# Patient Record
Sex: Male | Born: 1945 | Race: White | Hispanic: No | Marital: Married | Smoking: Former smoker
Health system: Southern US, Community
[De-identification: ages and names within clinical notes are randomized; demographics above are authoritative.]

## PROBLEM LIST (undated history)

## (undated) DIAGNOSIS — N4340 Spermatocele of epididymis, unspecified: Secondary | ICD-10-CM

## (undated) DIAGNOSIS — K5900 Constipation, unspecified: Secondary | ICD-10-CM

## (undated) DIAGNOSIS — C801 Malignant (primary) neoplasm, unspecified: Secondary | ICD-10-CM

## (undated) DIAGNOSIS — Z8639 Personal history of other endocrine, nutritional and metabolic disease: Secondary | ICD-10-CM

## (undated) DIAGNOSIS — E118 Type 2 diabetes mellitus with unspecified complications: Secondary | ICD-10-CM

## (undated) DIAGNOSIS — R351 Nocturia: Secondary | ICD-10-CM

## (undated) DIAGNOSIS — Z87448 Personal history of other diseases of urinary system: Secondary | ICD-10-CM

## (undated) DIAGNOSIS — Z8042 Family history of malignant neoplasm of prostate: Secondary | ICD-10-CM

## (undated) DIAGNOSIS — N185 Chronic kidney disease, stage 5: Secondary | ICD-10-CM

## (undated) DIAGNOSIS — E119 Type 2 diabetes mellitus without complications: Secondary | ICD-10-CM

## (undated) DIAGNOSIS — Z803 Family history of malignant neoplasm of breast: Secondary | ICD-10-CM

## (undated) DIAGNOSIS — I10 Essential (primary) hypertension: Secondary | ICD-10-CM

## (undated) DIAGNOSIS — N2889 Other specified disorders of kidney and ureter: Secondary | ICD-10-CM

## (undated) DIAGNOSIS — Z800 Family history of malignant neoplasm of digestive organs: Secondary | ICD-10-CM

## (undated) DIAGNOSIS — R972 Elevated prostate specific antigen [PSA]: Secondary | ICD-10-CM

## (undated) HISTORY — DX: Family history of malignant neoplasm of breast: Z80.3

## (undated) HISTORY — DX: Family history of malignant neoplasm of digestive organs: Z80.0

## (undated) HISTORY — DX: Family history of malignant neoplasm of prostate: Z80.42

---

## 1969-03-15 HISTORY — PX: OTHER SURGICAL HISTORY: SHX169

## 2005-07-15 HISTORY — PX: OTHER SURGICAL HISTORY: SHX169

## 2010-03-27 ENCOUNTER — Ambulatory Visit: Payer: Self-pay | Admitting: Oncology

## 2010-04-19 LAB — CBC WITH DIFFERENTIAL/PLATELET
Basophils Absolute: 0 10*3/uL (ref 0.0–0.1)
Eosinophils Absolute: 0.2 10*3/uL (ref 0.0–0.5)
HCT: 30.4 % — ABNORMAL LOW (ref 38.4–49.9)
HGB: 10.2 g/dL — ABNORMAL LOW (ref 13.0–17.1)
LYMPH%: 24.9 % (ref 14.0–49.0)
MONO#: 0.5 10*3/uL (ref 0.1–0.9)
NEUT#: 2.7 10*3/uL (ref 1.5–6.5)
NEUT%: 58.2 % (ref 39.0–75.0)
Platelets: 238 10*3/uL (ref 140–400)
WBC: 4.7 10*3/uL (ref 4.0–10.3)

## 2010-04-20 LAB — PROTEIN / CREATININE RATIO, URINE
Protein Creatinine Ratio: 0.29 — ABNORMAL HIGH (ref <0.15)
Total Protein, Urine: 53 mg/dL

## 2010-04-23 LAB — IRON AND TIBC: Iron: 67 ug/dL (ref 42–165)

## 2010-04-23 LAB — BETA 2 MICROGLOBULIN, SERUM: Beta-2 Microglobulin: 5.9 mg/L — ABNORMAL HIGH (ref 1.01–1.73)

## 2010-04-23 LAB — COMPREHENSIVE METABOLIC PANEL
ALT: 9 U/L (ref 0–53)
CO2: 23 mEq/L (ref 19–32)
Calcium: 9.5 mg/dL (ref 8.4–10.5)
Chloride: 103 mEq/L (ref 96–112)
Sodium: 139 mEq/L (ref 135–145)
Total Protein: 6.6 g/dL (ref 6.0–8.3)

## 2010-04-23 LAB — URIC ACID: Uric Acid, Serum: 7.2 mg/dL (ref 4.0–7.8)

## 2010-04-23 LAB — IMMUNOFIXATION ELECTROPHORESIS: IgM, Serum: 39 mg/dL — ABNORMAL LOW (ref 60–263)

## 2010-04-23 LAB — FERRITIN: Ferritin: 22 ng/mL (ref 22–322)

## 2010-04-23 LAB — KAPPA/LAMBDA LIGHT CHAINS
Kappa free light chain: 2.25 mg/dL — ABNORMAL HIGH (ref 0.33–1.94)
Lambda Free Lght Chn: 1.81 mg/dL (ref 0.57–2.63)

## 2010-04-23 LAB — LACTATE DEHYDROGENASE: LDH: 150 U/L (ref 94–250)

## 2010-04-23 LAB — CREATININE CLEARANCE, URINE, 24 HOUR
Collection Interval-CRCL: 24 hours
Creatinine Clearance: 31 mL/min — ABNORMAL LOW (ref 75–125)
Creatinine, 24H Ur: 1440 mg/d (ref 800–2000)
Creatinine, Urine: 92.9 mg/dL

## 2010-04-23 LAB — TRANSFERRIN RECEPTOR, SOLUABLE: Transferrin Receptor, Soluble: 17.1 nmol/L

## 2010-04-25 LAB — UIFE/LIGHT CHAINS/TP QN, 24-HR UR
Beta, Urine: DETECTED — AB
Free Kappa Lt Chains,Ur: 12.4 mg/dL — ABNORMAL HIGH (ref 0.04–1.51)
Free Lambda Lt Chains,Ur: 1.34 mg/dL — ABNORMAL HIGH (ref 0.08–1.01)
Free Lt Chn Excr Rate: 192.2 mg/d
Total Protein, Urine-Ur/day: 372 mg/d — ABNORMAL HIGH (ref 10–140)
Volume, Urine: 1550 mL

## 2010-06-20 ENCOUNTER — Ambulatory Visit: Payer: Self-pay | Admitting: Oncology

## 2010-06-22 LAB — CBC WITH DIFFERENTIAL/PLATELET
BASO%: 0.8 % (ref 0.0–2.0)
Eosinophils Absolute: 0.2 10*3/uL (ref 0.0–0.5)
MCHC: 32.8 g/dL (ref 32.0–36.0)
MONO#: 0.5 10*3/uL (ref 0.1–0.9)
NEUT#: 3.2 10*3/uL (ref 1.5–6.5)
RBC: 4.36 10*6/uL (ref 4.20–5.82)
RDW: 15.4 % — ABNORMAL HIGH (ref 11.0–14.6)
WBC: 5.1 10*3/uL (ref 4.0–10.3)
lymph#: 1.1 10*3/uL (ref 0.9–3.3)

## 2010-06-22 LAB — COMPREHENSIVE METABOLIC PANEL
ALT: 12 U/L (ref 0–53)
AST: 19 U/L (ref 0–37)
CO2: 27 mEq/L (ref 19–32)
Calcium: 9.7 mg/dL (ref 8.4–10.5)
Chloride: 103 mEq/L (ref 96–112)
Creatinine, Ser: 3.17 mg/dL — ABNORMAL HIGH (ref 0.40–1.50)
Sodium: 139 mEq/L (ref 135–145)
Total Protein: 7.4 g/dL (ref 6.0–8.3)

## 2010-06-22 LAB — LACTATE DEHYDROGENASE: LDH: 134 U/L (ref 94–250)

## 2010-06-25 LAB — KAPPA/LAMBDA LIGHT CHAINS
Kappa free light chain: 3.74 mg/dL — ABNORMAL HIGH (ref 0.33–1.94)
Lambda Free Lght Chn: 2.77 mg/dL — ABNORMAL HIGH (ref 0.57–2.63)

## 2010-06-25 LAB — IGG, IGA, IGM: IgG (Immunoglobin G), Serum: 1030 mg/dL (ref 694–1618)

## 2010-07-17 ENCOUNTER — Ambulatory Visit: Payer: Self-pay | Admitting: Oncology

## 2012-07-31 ENCOUNTER — Other Ambulatory Visit (HOSPITAL_COMMUNITY): Payer: Self-pay | Admitting: Urology

## 2012-07-31 DIAGNOSIS — D49519 Neoplasm of unspecified behavior of unspecified kidney: Secondary | ICD-10-CM

## 2012-08-01 ENCOUNTER — Emergency Department (HOSPITAL_COMMUNITY): Payer: Medicare Other

## 2012-08-01 ENCOUNTER — Inpatient Hospital Stay (HOSPITAL_COMMUNITY)
Admission: EM | Admit: 2012-08-01 | Discharge: 2012-08-04 | DRG: 872 | Disposition: A | Discharge status: Home / Self Care | Payer: Medicare Other | Attending: Internal Medicine | Admitting: Internal Medicine

## 2012-08-01 ENCOUNTER — Encounter (HOSPITAL_COMMUNITY): Payer: Self-pay | Admitting: Emergency Medicine

## 2012-08-01 DIAGNOSIS — E119 Type 2 diabetes mellitus without complications: Secondary | ICD-10-CM

## 2012-08-01 DIAGNOSIS — E1129 Type 2 diabetes mellitus with other diabetic kidney complication: Secondary | ICD-10-CM | POA: Diagnosis present

## 2012-08-01 DIAGNOSIS — N184 Chronic kidney disease, stage 4 (severe): Secondary | ICD-10-CM | POA: Diagnosis present

## 2012-08-01 DIAGNOSIS — Z79899 Other long term (current) drug therapy: Secondary | ICD-10-CM

## 2012-08-01 DIAGNOSIS — N453 Epididymo-orchitis: Secondary | ICD-10-CM

## 2012-08-01 DIAGNOSIS — I129 Hypertensive chronic kidney disease with stage 1 through stage 4 chronic kidney disease, or unspecified chronic kidney disease: Secondary | ICD-10-CM | POA: Diagnosis present

## 2012-08-01 DIAGNOSIS — A419 Sepsis, unspecified organism: Principal | ICD-10-CM | POA: Diagnosis present

## 2012-08-01 DIAGNOSIS — D72829 Elevated white blood cell count, unspecified: Secondary | ICD-10-CM

## 2012-08-01 DIAGNOSIS — E1169 Type 2 diabetes mellitus with other specified complication: Secondary | ICD-10-CM | POA: Diagnosis present

## 2012-08-01 DIAGNOSIS — N189 Chronic kidney disease, unspecified: Secondary | ICD-10-CM

## 2012-08-01 DIAGNOSIS — E162 Hypoglycemia, unspecified: Secondary | ICD-10-CM | POA: Diagnosis present

## 2012-08-01 DIAGNOSIS — E871 Hypo-osmolality and hyponatremia: Secondary | ICD-10-CM | POA: Diagnosis present

## 2012-08-01 DIAGNOSIS — Q619 Cystic kidney disease, unspecified: Secondary | ICD-10-CM

## 2012-08-01 DIAGNOSIS — I10 Essential (primary) hypertension: Secondary | ICD-10-CM

## 2012-08-01 DIAGNOSIS — N451 Epididymitis: Secondary | ICD-10-CM

## 2012-08-01 DIAGNOSIS — N179 Acute kidney failure, unspecified: Secondary | ICD-10-CM

## 2012-08-01 HISTORY — DX: Essential (primary) hypertension: I10

## 2012-08-01 LAB — BASIC METABOLIC PANEL
CO2: 23 mEq/L (ref 19–32)
Chloride: 88 mEq/L — ABNORMAL LOW (ref 96–112)
Creatinine, Ser: 4.61 mg/dL — ABNORMAL HIGH (ref 0.50–1.35)

## 2012-08-01 LAB — URINALYSIS, ROUTINE W REFLEX MICROSCOPIC
Glucose, UA: NEGATIVE mg/dL
Ketones, ur: NEGATIVE mg/dL
Leukocytes, UA: NEGATIVE
pH: 5.5 (ref 5.0–8.0)

## 2012-08-01 LAB — CBC WITH DIFFERENTIAL/PLATELET
Basophils Absolute: 0 10*3/uL (ref 0.0–0.1)
HCT: 25.7 % — ABNORMAL LOW (ref 39.0–52.0)
Hemoglobin: 8.8 g/dL — ABNORMAL LOW (ref 13.0–17.0)
Lymphocytes Relative: 2 % — ABNORMAL LOW (ref 12–46)
Monocytes Absolute: 1.2 10*3/uL — ABNORMAL HIGH (ref 0.1–1.0)
Monocytes Relative: 5 % (ref 3–12)
Neutro Abs: 22.2 10*3/uL — ABNORMAL HIGH (ref 1.7–7.7)
RBC: 3.84 MIL/uL — ABNORMAL LOW (ref 4.22–5.81)
RDW: 18 % — ABNORMAL HIGH (ref 11.5–15.5)
WBC: 23.8 10*3/uL — ABNORMAL HIGH (ref 4.0–10.5)

## 2012-08-01 LAB — URINE MICROSCOPIC-ADD ON

## 2012-08-01 LAB — GLUCOSE, CAPILLARY
Glucose-Capillary: 96 mg/dL (ref 70–99)
Glucose-Capillary: 148 mg/dL — ABNORMAL HIGH (ref 70–99)
Glucose-Capillary: 25 mg/dL — CL (ref 70–99)
Glucose-Capillary: 113 mg/dL — ABNORMAL HIGH (ref 70–99)
Glucose-Capillary: 29 mg/dL — CL (ref 70–99)

## 2012-08-01 LAB — GRAM STAIN

## 2012-08-01 MED ORDER — DEXTROSE 50 % IV SOLN
INTRAVENOUS | Status: AC
  Administered 2012-08-01: 50 mL
  Filled 2012-08-01: qty 50

## 2012-08-01 MED ORDER — DEXTROSE 50 % IV SOLN
1.0000 | Freq: Once | INTRAVENOUS | Status: DC
  Filled 2012-08-01: qty 50

## 2012-08-01 MED ORDER — DEXTROSE 50 % IV SOLN
1.0000 | Freq: Once | INTRAVENOUS | Status: AC
  Administered 2012-08-01: 50 mL via INTRAVENOUS
  Filled 2012-08-01: qty 50

## 2012-08-01 MED ORDER — DEXTROSE 50 % IV SOLN
1.0000 | Freq: Once | INTRAVENOUS | Status: AC
  Administered 2012-08-01: 50 mL via INTRAVENOUS

## 2012-08-01 MED ORDER — DEXTROSE 10 % IV SOLN
INTRAVENOUS | Status: DC
  Administered 2012-08-01 – 2012-08-02 (×2): via INTRAVENOUS

## 2012-08-01 MED ORDER — SODIUM CHLORIDE 0.9 % IV BOLUS (SEPSIS)
1000.0000 mL | Freq: Once | INTRAVENOUS | Status: AC
  Administered 2012-08-01: 1000 mL via INTRAVENOUS

## 2012-08-01 NOTE — ED Notes (Signed)
Pt presents to ED via EMS from home with c/o of unresponsive. CBG 36 EMS gave of d10. 20g in left hand.

## 2012-08-01 NOTE — ED Notes (Signed)
cbg was71.

## 2012-08-01 NOTE — ED Notes (Signed)
cbg was 117 

## 2012-08-01 NOTE — ED Notes (Signed)
Patient transported to Ultrasound 

## 2012-08-01 NOTE — ED Notes (Signed)
cbg was 113 

## 2012-08-01 NOTE — ED Provider Notes (Signed)
History     CSN: [id number]  Arrival date & time 08/01/12  1735   First MD Initiated Contact with Patient 08/01/12 1736      Chief Complaint  Patient presents with  . Hypoglycemia    (Consider location/radiation/quality/duration/timing/severity/associated sxs/prior treatment) Patient is a 67 y.o. male presenting with diabetes problem. The history is provided by the patient.  Diabetes He presents for his follow-up diabetic visit. He has type 2 diabetes mellitus. His disease course has been fluctuating. Hypoglycemia symptoms include confusion and dizziness. Pertinent negatives for hypoglycemia include no headaches. Pertinent negatives for diabetes include no chest pain, no fatigue and no weakness. Hypoglycemia complications include blackouts. Risk factors for coronary artery disease include hypertension. Current diabetic treatment includes oral agent (dual therapy). He is compliant with treatment all of the time. His weight is stable. He is following a diabetic diet. His home blood glucose trend is fluctuating dramatically.    Past Medical History  Diagnosis Date  . Diabetes mellitus without complication   . Hypertension     History reviewed. No pertinent past surgical history.  History reviewed. No pertinent family history.  History  Substance Use Topics  . Smoking status: Not on file  . Smokeless tobacco: Not on file  . Alcohol Use:       Review of Systems  Constitutional: Negative for fever and fatigue.  HENT: Negative for congestion, rhinorrhea and postnasal drip.   Eyes: Negative for photophobia and visual disturbance.  Respiratory: Negative for chest tightness, shortness of breath and wheezing.   Cardiovascular: Negative for chest pain, palpitations and leg swelling.  Gastrointestinal: Negative for nausea, vomiting, abdominal pain and diarrhea.  Genitourinary: Negative for urgency, frequency and difficulty urinating.  Musculoskeletal: Negative for back pain and  arthralgias.  Skin: Negative for rash and wound.  Neurological: Positive for dizziness. Negative for weakness and headaches.  Psychiatric/Behavioral: Positive for confusion. Negative for agitation.    Allergies  Review of patient's allergies indicates no known allergies.  Home Medications   Current Outpatient Rx  Name  Route  Sig  Dispense  Refill  . AMLODIPINE BESYLATE 10 MG PO TABS   Oral   Take 10 mg by mouth daily.         Marland Kitchen CIPROFLOXACIN HCL 250 MG PO TABS   Oral   Take 250 mg by mouth daily.         Marland Kitchen CLONIDINE HCL 0.2 MG PO TABS   Oral   Take 0.2 mg by mouth 2 (two) times daily.         Marland Kitchen DILTIAZEM HCL ER BEADS 240 MG PO CP24   Oral   Take 240 mg by mouth daily.         Marland Kitchen GLIPIZIDE 10 MG PO TABS   Oral   Take 10 mg by mouth 2 (two) times daily before a meal.         . HYDRALAZINE HCL 25 MG PO TABS   Oral   Take 25 mg by mouth 4 (four) times daily.         Marland Kitchen LABETALOL HCL 200 MG PO TABS   Oral   Take 200 mg by mouth 2 (two) times daily.         Marland Kitchen LOSARTAN POTASSIUM-HCTZ 100-25 MG PO TABS   Oral   Take 1 tablet by mouth daily.         Marland Kitchen METFORMIN HCL 500 MG PO TABS   Oral   Take 500 mg by mouth  3 (three) times daily.         Marland Kitchen POTASSIUM CHLORIDE ER 10 MEQ PO TBCR   Oral   Take 10 mEq by mouth 2 (two) times daily.           BP 146/72  Pulse 77  Temp 100.1 F (37.8 C) (Oral)  Resp 24  SpO2 98%  Physical Exam  Nursing note and vitals reviewed. Constitutional: He is oriented to person, place, and time. He appears well-developed and well-nourished. No distress.  HENT:  Head: Normocephalic and atraumatic.  Mouth/Throat: Oropharynx is clear and moist.  Eyes: EOM are normal. Pupils are equal, round, and reactive to light.  Neck: Normal range of motion. Neck supple.  Cardiovascular: Normal rate, regular rhythm, normal heart sounds and intact distal pulses.   Pulmonary/Chest: Effort normal and breath sounds normal. He has no  wheezes. He has no rales.  Abdominal: Soft. Bowel sounds are normal. He exhibits no distension. There is no tenderness. There is no rebound and no guarding.  Genitourinary:       Right testicular tenderness. Normal cremasteric reflex. Right testicle is swollen, red, and tender.  Musculoskeletal: Normal range of motion. He exhibits no edema and no tenderness.  Lymphadenopathy:    He has no cervical adenopathy.  Neurological: He is alert and oriented to person, place, and time. He displays normal reflexes. No cranial nerve deficit. He exhibits normal muscle tone. Coordination normal.  Skin: Skin is warm and dry. No rash noted.  Psychiatric: He has a normal mood and affect. His behavior is normal.    ED Course  Procedures (including critical care time)  Labs Reviewed  CBC WITH DIFFERENTIAL - Abnormal; Notable for the following:    WBC 23.8 (*)     RBC 3.84 (*)     Hemoglobin 8.8 (*)     HCT 25.7 (*)     MCV 66.9 (*)     MCH 22.9 (*)     RDW 18.0 (*)     Neutrophils Relative 93 (*)     Neutro Abs 22.2 (*)     Lymphocytes Relative 2 (*)     Lymphs Abs 0.4 (*)     Monocytes Absolute 1.2 (*)     All other components within normal limits  BASIC METABOLIC PANEL - Abnormal; Notable for the following:    Sodium 129 (*)     Chloride 88 (*)     BUN 58 (*)     Creatinine, Ser 4.61 (*)     GFR calc non Af Amer 12 (*)     GFR calc Af Amer 14 (*)     All other components within normal limits  URINALYSIS, ROUTINE W REFLEX MICROSCOPIC - Abnormal; Notable for the following:    APPearance CLOUDY (*)     Protein, ur 30 (*)     All other components within normal limits  GLUCOSE, CAPILLARY - Abnormal; Notable for the following:    Glucose-Capillary 25 (*)     All other components within normal limits  GLUCOSE, CAPILLARY - Abnormal; Notable for the following:    Glucose-Capillary 148 (*)     All other components within normal limits  GLUCOSE, CAPILLARY - Abnormal; Notable for the following:      Glucose-Capillary 31 (*)     All other components within normal limits  GLUCOSE, CAPILLARY - Abnormal; Notable for the following:    Glucose-Capillary 117 (*)     All other components within normal limits  GLUCOSE,  CAPILLARY - Abnormal; Notable for the following:    Glucose-Capillary 53 (*)     All other components within normal limits  GRAM STAIN  GLUCOSE, CAPILLARY  URINE MICROSCOPIC-ADD ON  URINE CULTURE  LACTIC ACID, PLASMA   US Scrotum  08/01/2012  *RADIOLOGY REPORT*  Clinical Data:  67 year old male with right scrotal swelling.  SCROTAL ULTRASOUND DOPPLER ULTRASOUND OF THE TESTICLES  Technique: Complete ultrasound examination of the testicles, epididymis, and other scrotal structures was performed.  Color and spectral Doppler ultrasound were also utilized to evaluate blood flow to the testicles.  Comparison:  None  Findings:  Right testis:  The right testicle is normal in size and echogenicity measuring 4.8 x 2.9 x 3.6 cm.  No focal testicular masses are identified.  Increased vascularity of the right testicle is noted.  Color Doppler flow and arterial/venous waveforms are noted.  Left testis:  The left testicle is normal in size and echogenicity measuring 4 x 2.7 x 2.6 cm.  There is no evidence of solid mass. Normal color Doppler flow and arterial/venous waveforms are noted.  Right epididymis:  The right epididymis is not enlarged and hypervascular compatible with epididymitis.  Left epididymis:  Small spermatoceles/epididymal cysts are noted.  Hydrocele:  A large right hydrocele is noted. A moderate left hydrocele with internal debris/echoes is noted.  Varicocele:  A left varicocele is present.  Pulsed Doppler interrogation of both testes demonstrates low resistance flow bilaterally.  IMPRESSION: Enlarged right epididymis with increased vascularity in both the right epididymis and right testicle - compatible with right epididymo-orchitis.  Associated large right hydrocele.  Moderate left  hydrocele with low level echoes/debris.  No evidence of testicular torsion or focal testicular mass.  Left varicocele.   Original Report Authenticated By: Harmon Pier, M.D.    [place] Art/ven Flow Abd Pelv Doppler  08/01/2012  *RADIOLOGY REPORT*  Clinical Data:  67 year old male with right scrotal swelling.  SCROTAL ULTRASOUND DOPPLER ULTRASOUND OF THE TESTICLES  Technique: Complete ultrasound examination of the testicles, epididymis, and other scrotal structures was performed.  Color and spectral Doppler ultrasound were also utilized to evaluate blood flow to the testicles.  Comparison:  None  Findings:  Right testis:  The right testicle is normal in size and echogenicity measuring 4.8 x 2.9 x 3.6 cm.  No focal testicular masses are identified.  Increased vascularity of the right testicle is noted.  Color Doppler flow and arterial/venous waveforms are noted.  Left testis:  The left testicle is normal in size and echogenicity measuring 4 x 2.7 x 2.6 cm.  There is no evidence of solid mass. Normal color Doppler flow and arterial/venous waveforms are noted.  Right epididymis:  The right epididymis is not enlarged and hypervascular compatible with epididymitis.  Left epididymis:  Small spermatoceles/epididymal cysts are noted.  Hydrocele:  A large right hydrocele is noted. A moderate left hydrocele with internal debris/echoes is noted.  Varicocele:  A left varicocele is present.  Pulsed Doppler interrogation of both testes demonstrates low resistance flow bilaterally.  IMPRESSION: Enlarged right epididymis with increased vascularity in both the right epididymis and right testicle - compatible with right epididymo-orchitis.  Associated large right hydrocele.  Moderate left hydrocele with low level echoes/debris.  No evidence of testicular torsion or focal testicular mass.  Left varicocele.   Original Report Authenticated By: Harmon Pier, M.D.      1. Hypoglycemia   2. Epididymo-orchitis without abscess   3. Acute  renal failure   4. Leukocytosis  5. Diabetes mellitus without complication   6. Hypertension   7. CKD (chronic kidney disease)   8. Epididymitis       MDM  63M presents after a hypoglycemic event x 2 today. Pt is a known diabetic currently on Metformin and glipizide. This morning, pt was unresponsive per wife, EMS came and found the patient to be hypoglycemic and was given dextrose. Pt improved and did not come to the hospital. Again, this afternoon, pt became unresponsive and was given 250 cc of D10 and is almost back to baseline. Still with some confusion. Also, pt is currently on Ciprofloxacin for a epididymitis that was started  3 days ago by his urologist. Current blood sugar is 96. Will obtain labs and plan for observation in hospital for recurrent hypoglycemia.  Labs reveal significant leukocytosis of 23,800. Worsening renal failure with creatinine 4.6. Urine studies pending. Right testicle swollen and tender. Will obtain scrotal u/s to r/o testicular abscess.  Glucose dropped again to 25. Given 1 amp of D50 and improved to 148. Placed on D10 gtt. Pending ultrasound.   Ultrasound c/w epididymo-orchitis without abscess. Admitted to medicine for persistent hypoglycemia and acute renal failure.        Johnnette Gourd, MD 08/01/12 [phone]

## 2012-08-01 NOTE — H&P (Signed)
Chief Complaint:  Low sugar  HPI: 67 yo male dx with epididymitis about 3 days ago with swollen and painful rt testicle place on cipro by urology comes in with several days of not eating normally and found to have glucose in the 20s today.  His glucose has been dipping low for several days but he is still taking his oral hyperglycemic meds.  No fevers but has been having chills.  His testicle is less swollen than 3 days ago.  Is suppose to take cipro for 28 days or so then f/u with urology for a biopsy.  Has been drinking fluid but not eating normally due to his recent illness.  No n/v/d.  In ED his glucose is dropping in the 50's despite D50 given.  Took his dm meds this am but not tonight's doses.  Review of Systems:  O/w neg  Past Medical History: Past Medical History  Diagnosis Date  . Diabetes mellitus without complication   . Hypertension    History reviewed. No pertinent past surgical history.  Medications: Prior to Admission medications   Medication Sig Start Date End Date Taking? Authorizing Provider  amLODipine (NORVASC) 10 MG tablet Take 10 mg by mouth daily.   Yes Historical Provider, MD  ciprofloxacin (CIPRO) 250 MG tablet Take 250 mg by mouth daily.   Yes Historical Provider, MD  cloNIDine (CATAPRES) 0.2 MG tablet Take 0.2 mg by mouth 2 (two) times daily.   Yes Historical Provider, MD  diltiazem (TIAZAC) 240 MG 24 hr capsule Take 240 mg by mouth daily.   Yes Historical Provider, MD  glipiZIDE (GLUCOTROL) 10 MG tablet Take 10 mg by mouth 2 (two) times daily before a meal.   Yes Historical Provider, MD  hydrALAZINE (APRESOLINE) 25 MG tablet Take 25 mg by mouth 4 (four) times daily.   Yes Historical Provider, MD  labetalol (NORMODYNE) 200 MG tablet Take 200 mg by mouth 2 (two) times daily.   Yes Historical Provider, MD  losartan-hydrochlorothiazide (HYZAAR) 100-25 MG per tablet Take 1 tablet by mouth daily.   Yes Historical Provider, MD  metFORMIN (GLUCOPHAGE) 500 MG tablet  Take 500 mg by mouth 3 (three) times daily.   Yes Historical Provider, MD  potassium chloride (K-DUR) 10 MEQ tablet Take 10 mEq by mouth 2 (two) times daily.   Yes Historical Provider, MD    Allergies:  No Known Allergies  Social History: neg  Family History: History reviewed. No pertinent family history.  Physical Exam: Filed Vitals:   08/01/12 1800 08/01/12 1915 08/01/12 1930 08/01/12 1945  BP: 122/97 133/70 140/69 146/72  Pulse: 84 68 79 77  Temp:      TempSrc:      Resp:      SpO2: 100% 96% 97% 98%   General appearance: alert, cooperative and no distress Neck: no JVD and supple, symmetrical, trachea midline Lungs: clear to auscultation bilaterally Heart: regular rate and rhythm, S1, S2 normal, no murmur, click, rub or gallop Abdomen: soft, non-tender; bowel sounds normal; no masses,  no organomegaly Male genitalia: normal penis, abnormal findings: swollen and painful rt testicle Extremities: extremities normal, atraumatic, no cyanosis or edema Pulses: 2+ and symmetric Skin: Skin color, texture, turgor normal. No rashes or lesions Neurologic: Grossly normal    Labs on Admission:   [facility] 08/01/12 1743  NA 129*  K 4.1  CL 88*  CO2 23  GLUCOSE 71  BUN 58*  CREATININE 4.61*  CALCIUM 9.3  MG --  PHOS --  Basename 08/01/12 1743  WBC 23.8*  NEUTROABS 22.2*  HGB 8.8*  HCT 25.7*  MCV 66.9*  PLT 203    Radiological Exams on Admission: US Scrotum  08/01/2012  *RADIOLOGY REPORT*  Clinical Data:  67 year old male with right scrotal swelling.  SCROTAL ULTRASOUND DOPPLER ULTRASOUND OF THE TESTICLES  Technique: Complete ultrasound examination of the testicles, epididymis, and other scrotal structures was performed.  Color and spectral Doppler ultrasound were also utilized to evaluate blood flow to the testicles.  Comparison:  None  Findings:  Right testis:  The right testicle is normal in size and echogenicity measuring 4.8 x 2.9 x 3.6 cm.  No focal testicular  masses are identified.  Increased vascularity of the right testicle is noted.  Color Doppler flow and arterial/venous waveforms are noted.  Left testis:  The left testicle is normal in size and echogenicity measuring 4 x 2.7 x 2.6 cm.  There is no evidence of solid mass. Normal color Doppler flow and arterial/venous waveforms are noted.  Right epididymis:  The right epididymis is not enlarged and hypervascular compatible with epididymitis.  Left epididymis:  Small spermatoceles/epididymal cysts are noted.  Hydrocele:  A large right hydrocele is noted. A moderate left hydrocele with internal debris/echoes is noted.  Varicocele:  A left varicocele is present.  Pulsed Doppler interrogation of both testes demonstrates low resistance flow bilaterally.  IMPRESSION: Enlarged right epididymis with increased vascularity in both the right epididymis and right testicle - compatible with right epididymo-orchitis.  Associated large right hydrocele.  Moderate left hydrocele with low level echoes/debris.  No evidence of testicular torsion or focal testicular mass.  Left varicocele.   Original Report Authenticated By: Harmon Pier, M.D.    [place] Art/ven Flow Abd Pelv Doppler  08/01/2012  *RADIOLOGY REPORT*  Clinical Data:  67 year old male with right scrotal swelling.  SCROTAL ULTRASOUND DOPPLER ULTRASOUND OF THE TESTICLES  Technique: Complete ultrasound examination of the testicles, epididymis, and other scrotal structures was performed.  Color and spectral Doppler ultrasound were also utilized to evaluate blood flow to the testicles.  Comparison:  None  Findings:  Right testis:  The right testicle is normal in size and echogenicity measuring 4.8 x 2.9 x 3.6 cm.  No focal testicular masses are identified.  Increased vascularity of the right testicle is noted.  Color Doppler flow and arterial/venous waveforms are noted.  Left testis:  The left testicle is normal in size and echogenicity measuring 4 x 2.7 x 2.6 cm.  There is no  evidence of solid mass. Normal color Doppler flow and arterial/venous waveforms are noted.  Right epididymis:  The right epididymis is not enlarged and hypervascular compatible with epididymitis.  Left epididymis:  Small spermatoceles/epididymal cysts are noted.  Hydrocele:  A large right hydrocele is noted. A moderate left hydrocele with internal debris/echoes is noted.  Varicocele:  A left varicocele is present.  Pulsed Doppler interrogation of both testes demonstrates low resistance flow bilaterally.  IMPRESSION: Enlarged right epididymis with increased vascularity in both the right epididymis and right testicle - compatible with right epididymo-orchitis.  Associated large right hydrocele.  Moderate left hydrocele with low level echoes/debris.  No evidence of testicular torsion or focal testicular mass.  Left varicocele.   Original Report Authenticated By: Harmon Pier, M.D.     Assessment/Plan  67 yo male with recent epididymitis, poor oral intake with hypoglycemia due to oral hyperglycemic medications Principal Problem:  *Hypoglycemia Active Problems:  Diabetes mellitus without complication  Hypertension  Acute renal failure  CKD (chronic kidney disease)  Epididymitis  Hold all dm meds.  Cont d10.  Ck glucose hourly.  Cont cipro.  hypoglyemia should resolve overnight with holding meds.  Obs.  Cont f/u with urology as outpt.  Full code.    Levonte Molina A 08/01/2012, 9:59 PM

## 2012-08-01 NOTE — ED Notes (Signed)
cbg was 31.

## 2012-08-01 NOTE — ED Notes (Signed)
cbg was 53.

## 2012-08-01 NOTE — ED Notes (Signed)
CBG 96. 

## 2012-08-02 ENCOUNTER — Encounter (HOSPITAL_COMMUNITY): Payer: Self-pay | Admitting: *Deleted

## 2012-08-02 DIAGNOSIS — E871 Hypo-osmolality and hyponatremia: Secondary | ICD-10-CM | POA: Diagnosis present

## 2012-08-02 DIAGNOSIS — N452 Orchitis: Secondary | ICD-10-CM

## 2012-08-02 DIAGNOSIS — A419 Sepsis, unspecified organism: Secondary | ICD-10-CM | POA: Diagnosis present

## 2012-08-02 DIAGNOSIS — N453 Epididymo-orchitis: Secondary | ICD-10-CM | POA: Diagnosis present

## 2012-08-02 DIAGNOSIS — D72829 Elevated white blood cell count, unspecified: Secondary | ICD-10-CM

## 2012-08-02 LAB — GLUCOSE, CAPILLARY
Glucose-Capillary: 114 mg/dL — ABNORMAL HIGH (ref 70–99)
Glucose-Capillary: 119 mg/dL — ABNORMAL HIGH (ref 70–99)
Glucose-Capillary: 130 mg/dL — ABNORMAL HIGH (ref 70–99)
Glucose-Capillary: 139 mg/dL — ABNORMAL HIGH (ref 70–99)
Glucose-Capillary: 142 mg/dL — ABNORMAL HIGH (ref 70–99)
Glucose-Capillary: 51 mg/dL — ABNORMAL LOW (ref 70–99)
Glucose-Capillary: 74 mg/dL (ref 70–99)
Glucose-Capillary: 80 mg/dL (ref 70–99)
Glucose-Capillary: 91 mg/dL (ref 70–99)
Glucose-Capillary: 94 mg/dL (ref 70–99)

## 2012-08-02 LAB — HEMOGLOBIN A1C
Hgb A1c MFr Bld: 6.5 % — ABNORMAL HIGH (ref <5.7)
Mean Plasma Glucose: 140 mg/dL — ABNORMAL HIGH (ref <117)

## 2012-08-02 LAB — CBC
Hemoglobin: 8.1 g/dL — ABNORMAL LOW (ref 13.0–17.0)
MCH: 22.8 pg — ABNORMAL LOW (ref 26.0–34.0)
Platelets: 204 10*3/uL (ref 150–400)
Platelets: 182 10*3/uL (ref 150–400)
RBC: 3.43 MIL/uL — ABNORMAL LOW (ref 4.22–5.81)
RBC: 3.34 MIL/uL — ABNORMAL LOW (ref 4.22–5.81)
WBC: 22.5 10*3/uL — ABNORMAL HIGH (ref 4.0–10.5)
WBC: 18.9 10*3/uL — ABNORMAL HIGH (ref 4.0–10.5)

## 2012-08-02 LAB — BASIC METABOLIC PANEL
Calcium: 8.7 mg/dL (ref 8.4–10.5)
Calcium: 8.7 mg/dL (ref 8.4–10.5)
Chloride: 89 mEq/L — ABNORMAL LOW (ref 96–112)
GFR calc Af Amer: 13 mL/min — ABNORMAL LOW (ref >90)
GFR calc Af Amer: 14 mL/min — ABNORMAL LOW (ref >90)
GFR calc Af Amer: 14 mL/min — ABNORMAL LOW (ref >90)
GFR calc non Af Amer: 12 mL/min — ABNORMAL LOW (ref >90)
GFR calc non Af Amer: 12 mL/min — ABNORMAL LOW (ref >90)
GFR calc non Af Amer: 12 mL/min — ABNORMAL LOW (ref >90)
Potassium: 3.3 mEq/L — ABNORMAL LOW (ref 3.5–5.1)
Potassium: 3.4 mEq/L — ABNORMAL LOW (ref 3.5–5.1)
Potassium: 3.5 mEq/L (ref 3.5–5.1)
Sodium: 124 mEq/L — ABNORMAL LOW (ref 135–145)
Sodium: 124 mEq/L — ABNORMAL LOW (ref 135–145)
Sodium: 125 mEq/L — ABNORMAL LOW (ref 135–145)

## 2012-08-02 LAB — PHOSPHORUS: Phosphorus: 3.8 mg/dL (ref 2.3–4.6)

## 2012-08-02 LAB — CREATININE, SERUM
Creatinine, Ser: 4.59 mg/dL — ABNORMAL HIGH (ref 0.50–1.35)
GFR calc Af Amer: 14 mL/min — ABNORMAL LOW (ref >90)
GFR calc non Af Amer: 12 mL/min — ABNORMAL LOW (ref >90)

## 2012-08-02 LAB — PROTEIN / CREATININE RATIO, URINE: Protein Creatinine Ratio: 0.21 — ABNORMAL HIGH (ref 0.00–0.15)

## 2012-08-02 MED ORDER — POTASSIUM CHLORIDE 20 MEQ PO PACK: 40.0000 meq | PACK | Freq: Once | ORAL | Status: DC

## 2012-08-02 MED ORDER — CIPROFLOXACIN HCL 250 MG PO TABS: 250.0000 mg | ORAL_TABLET | Freq: Every day | ORAL | Status: DC

## 2012-08-02 MED ORDER — POTASSIUM CHLORIDE CRYS ER 20 MEQ PO TBCR
20.0000 meq | EXTENDED_RELEASE_TABLET | Freq: Once | ORAL | Status: AC
  Administered 2012-08-02: 20 meq via ORAL
  Filled 2012-08-02 (×2): qty 1

## 2012-08-02 MED ORDER — LABETALOL HCL 200 MG PO TABS
200.0000 mg | ORAL_TABLET | Freq: Two times a day (BID) | ORAL | Status: DC
  Administered 2012-08-02 – 2012-08-04 (×5): 200 mg via ORAL
  Filled 2012-08-02 (×7): qty 1

## 2012-08-02 MED ORDER — DOXYCYCLINE HYCLATE 100 MG PO TABS
50.0000 mg | ORAL_TABLET | Freq: Two times a day (BID) | ORAL | Status: DC
  Administered 2012-08-02 – 2012-08-04 (×5): 50 mg via ORAL
  Filled 2012-08-02 (×7): qty 0.5

## 2012-08-02 MED ORDER — DILTIAZEM HCL ER BEADS 240 MG PO CP24
240.0000 mg | ORAL_CAPSULE | Freq: Every day | ORAL | Status: DC
  Filled 2012-08-02: qty 1

## 2012-08-02 MED ORDER — ACETAMINOPHEN 325 MG PO TABS: 650.0000 mg | ORAL_TABLET | Freq: Four times a day (QID) | ORAL | Status: DC | PRN

## 2012-08-02 MED ORDER — DEXTROSE 5 % IV SOLN
1.0000 g | INTRAVENOUS | Status: DC
  Administered 2012-08-02 – 2012-08-04 (×3): 1 g via INTRAVENOUS
  Filled 2012-08-02 (×3): qty 10

## 2012-08-02 MED ORDER — AMLODIPINE BESYLATE 10 MG PO TABS
10.0000 mg | ORAL_TABLET | Freq: Every day | ORAL | Status: DC
  Filled 2012-08-02: qty 1

## 2012-08-02 MED ORDER — SODIUM CHLORIDE 0.9 % IV SOLN
INTRAVENOUS | Status: DC
  Administered 2012-08-02: 16:00:00 via INTRAVENOUS

## 2012-08-02 MED ORDER — SODIUM CHLORIDE 0.9 % IV SOLN: INTRAVENOUS | Status: AC

## 2012-08-02 MED ORDER — POTASSIUM CHLORIDE 20 MEQ PO PACK
20.0000 meq | PACK | Freq: Once | ORAL | Status: DC
  Filled 2012-08-02: qty 1

## 2012-08-02 MED ORDER — SODIUM CHLORIDE 0.9 % IJ SOLN
3.0000 mL | Freq: Two times a day (BID) | INTRAMUSCULAR | Status: DC
  Administered 2012-08-03: 3 mL via INTRAVENOUS

## 2012-08-02 MED ORDER — DEXTROSE 50 % IV SOLN
INTRAVENOUS | Status: AC
  Administered 2012-08-02: 50 mL
  Filled 2012-08-02: qty 50

## 2012-08-02 MED ORDER — HEPARIN SODIUM (PORCINE) 5000 UNIT/ML IJ SOLN
5000.0000 [IU] | Freq: Three times a day (TID) | INTRAMUSCULAR | Status: DC
  Administered 2012-08-03 – 2012-08-04 (×4): 5000 [IU] via SUBCUTANEOUS
  Filled 2012-08-02 (×7): qty 1

## 2012-08-02 MED ORDER — ENOXAPARIN SODIUM 30 MG/0.3ML ~~LOC~~ SOLN
30.0000 mg | SUBCUTANEOUS | Status: DC
  Administered 2012-08-02: 30 mg via SUBCUTANEOUS
  Filled 2012-08-02 (×2): qty 0.3

## 2012-08-02 MED ORDER — CLONIDINE HCL 0.2 MG PO TABS
0.2000 mg | ORAL_TABLET | Freq: Two times a day (BID) | ORAL | Status: DC
  Filled 2012-08-02 (×3): qty 1

## 2012-08-02 MED ORDER — DEXTROSE-NACL 5-0.9 % IV SOLN
INTRAVENOUS | Status: DC
  Administered 2012-08-02: 13:00:00 via INTRAVENOUS

## 2012-08-02 MED ORDER — HYDRALAZINE HCL 25 MG PO TABS
25.0000 mg | ORAL_TABLET | Freq: Four times a day (QID) | ORAL | Status: DC
  Filled 2012-08-02 (×4): qty 1

## 2012-08-02 MED ORDER — CLONIDINE HCL 0.1 MG PO TABS
0.1000 mg | ORAL_TABLET | Freq: Two times a day (BID) | ORAL | Status: DC
  Administered 2012-08-02 – 2012-08-04 (×5): 0.1 mg via ORAL
  Filled 2012-08-02 (×6): qty 1

## 2012-08-02 MED ORDER — ACETAMINOPHEN 325 MG PO TABS
650.0000 mg | ORAL_TABLET | Freq: Four times a day (QID) | ORAL | Status: DC | PRN
  Administered 2012-08-02 – 2012-08-03 (×3): 650 mg via ORAL
  Filled 2012-08-02 (×3): qty 2

## 2012-08-02 MED ORDER — CIPROFLOXACIN IN D5W 200 MG/100ML IV SOLN
200.0000 mg | Freq: Two times a day (BID) | INTRAVENOUS | Status: DC
  Administered 2012-08-02: 200 mg via INTRAVENOUS
  Filled 2012-08-02 (×2): qty 100

## 2012-08-02 MED ORDER — DOXYCYCLINE HYCLATE 50 MG PO CAPS
50.0000 mg | ORAL_CAPSULE | Freq: Two times a day (BID) | ORAL | Status: DC
  Filled 2012-08-02 (×2): qty 1

## 2012-08-02 NOTE — Progress Notes (Signed)
DBP 50's did not receive BP meds, will re-eval.

## 2012-08-02 NOTE — Consult Note (Signed)
Reason for Consult:Rt epidiymorchitis, end-stage renal disease, elevated PSA, bilateral renal masses Referring Physician: Eldridge Dace MD  Tim Armstrong is an 67 y.o. male.  HPI:   1 - Rt Epidiymorchitis - Pt with 1 week history of worsening rt scrotal pain. No dysuria. UA negative. Was placed on empiric Cipro 3 days ago for suspected epidiymorchitis, but then developed fevers and hypoglycemia dn admitted to [facility]. Placed on IV rocephin for further therapy. Scrotal US on admissino confirms rt epididymorchitis w/o abscess and reactive hydrocele.   2 - Elevated PSA - Random PSA of 5.7 07/2012 on eval for hematuria. His father had prostate cancer. He has biopsy scheduled and pending 08/2012 at [facility].  3 - Microscopic Hematuria - Had cysto and CT 07/2012 which revealed large friable prostate as well as small bilateral renal masses as per below. No gross episodes.  4 - Bilateral Renal Masses - Bilateral small renal masses, incompletely characterized by recent CT stone study. Unable to receive contrast due to very poor renal function. Has abdominal MRI pending, previously scheduled as outpatient.  5 - End-Stage Renal Disease - pt with Cr 4's by BMP's past few weeks. Recent CT without hydro or evidence of obstructive uropathy. Has nephrology eval pending.   Past Medical History  Diagnosis Date  . Diabetes mellitus without complication   . Hypertension     History reviewed. No pertinent past surgical history.  History reviewed. No pertinent family history.  Social History:  does not have a smoking history on file. He does not have any smokeless tobacco history on file. His alcohol and drug histories not on file.  Allergies: No Known Allergies  Medications: I have reviewed the patient's current medications.  Results for orders placed during the hospital encounter of 08/01/12 (from the past 48 hour(s))  CBC WITH DIFFERENTIAL     Status: Abnormal   Collection Time   08/01/12  5:43 PM        Component Value Range Comment   WBC 23.8 (*) 4.0 - 10.5 K/uL    RBC 3.84 (*) 4.22 - 5.81 MIL/uL    Hemoglobin 8.8 (*) 13.0 - 17.0 g/dL    HCT 16.1 (*) 09.6 - 52.0 %    MCV 66.9 (*) 78.0 - 100.0 fL    MCH 22.9 (*) 26.0 - 34.0 pg    MCHC 34.2  30.0 - 36.0 g/dL    RDW 04.5 (*) 40.9 - 15.5 %    Platelets 203  150 - 400 K/uL    Neutrophils Relative 93 (*) 43 - 77 %    Neutro Abs 22.2 (*) 1.7 - 7.7 K/uL    Lymphocytes Relative 2 (*) 12 - 46 %    Lymphs Abs 0.4 (*) 0.7 - 4.0 K/uL    Monocytes Relative 5  3 - 12 %    Monocytes Absolute 1.2 (*) 0.1 - 1.0 K/uL    Eosinophils Relative 0  0 - 5 %    Eosinophils Absolute 0.0  0.0 - 0.7 K/uL    Basophils Relative 0  0 - 1 %    Basophils Absolute 0.0  0.0 - 0.1 K/uL   BASIC METABOLIC PANEL     Status: Abnormal   Collection Time   08/01/12  5:43 PM      Component Value Range Comment   Sodium 129 (*) 135 - 145 mEq/L    Potassium 4.1  3.5 - 5.1 mEq/L    Chloride 88 (*) 96 - 112 mEq/L  CO2 23  19 - 32 mEq/L    Glucose, Bld 71  70 - 99 mg/dL    BUN 58 (*) 6 - 23 mg/dL    Creatinine, Ser 1.30 (*) 0.50 - 1.35 mg/dL    Calcium 9.3  8.4 - 86.5 mg/dL    GFR calc non Af Amer 12 (*) >90 mL/min    GFR calc Af Amer 14 (*) >90 mL/min   GLUCOSE, CAPILLARY     Status: Normal   Collection Time   08/01/12  5:43 PM      Component Value Range Comment   Glucose-Capillary 96  70 - 99 mg/dL   URINALYSIS, ROUTINE W REFLEX MICROSCOPIC     Status: Abnormal   Collection Time   08/01/12  6:33 PM      Component Value Range Comment   Color, Urine YELLOW  YELLOW    APPearance CLOUDY (*) CLEAR    Specific Gravity, Urine 1.014  1.005 - 1.030    pH 5.5  5.0 - 8.0    Glucose, UA NEGATIVE  NEGATIVE mg/dL    Hgb urine dipstick NEGATIVE  NEGATIVE    Bilirubin Urine NEGATIVE  NEGATIVE    Ketones, ur NEGATIVE  NEGATIVE mg/dL    Protein, ur 30 (*) NEGATIVE mg/dL    Urobilinogen, UA 0.2  0.0 - 1.0 mg/dL    Nitrite NEGATIVE  NEGATIVE    Leukocytes, UA NEGATIVE   NEGATIVE   GRAM STAIN     Status: Normal   Collection Time   08/01/12  6:33 PM      Component Value Range Comment   Specimen Description URINE, CLEAN CATCH      Special Requests NONE      Gram Stain        Value: CYTOSPIN SLIDE     WBC PRESENT,BOTH PMN AND MONONUCLEAR     NEGATIVE FOR BACTERIA   Report Status 08/01/2012 FINAL     URINE MICROSCOPIC-ADD ON     Status: Normal   Collection Time   08/01/12  6:33 PM      Component Value Range Comment   Squamous Epithelial / LPF RARE  RARE    WBC, UA 0-2  <3 WBC/hpf   GLUCOSE, CAPILLARY     Status: Abnormal   Collection Time   08/01/12  6:59 PM      Component Value Range Comment   Glucose-Capillary 25 (*) 70 - 99 mg/dL   GLUCOSE, CAPILLARY     Status: Abnormal   Collection Time   08/01/12  7:20 PM      Component Value Range Comment   Glucose-Capillary 148 (*) 70 - 99 mg/dL   GLUCOSE, CAPILLARY     Status: Abnormal   Collection Time   08/01/12  8:44 PM      Component Value Range Comment   Glucose-Capillary 31 (*) 70 - 99 mg/dL   GLUCOSE, CAPILLARY     Status: Abnormal   Collection Time   08/01/12  9:16 PM      Component Value Range Comment   Glucose-Capillary 117 (*) 70 - 99 mg/dL   GLUCOSE, CAPILLARY     Status: Abnormal   Collection Time   08/01/12 10:03 PM      Component Value Range Comment   Glucose-Capillary 53 (*) 70 - 99 mg/dL   LACTIC ACID, PLASMA     Status: Normal   Collection Time   08/01/12 10:37 PM      Component Value Range Comment  Lactic Acid, Venous 0.7  0.5 - 2.2 mmol/L   GLUCOSE, CAPILLARY     Status: Abnormal   Collection Time   08/01/12 11:00 PM      Component Value Range Comment   Glucose-Capillary 29 (*) 70 - 99 mg/dL   GLUCOSE, CAPILLARY     Status: Abnormal   Collection Time   08/01/12 11:29 PM      Component Value Range Comment   Glucose-Capillary 113 (*) 70 - 99 mg/dL   GLUCOSE, CAPILLARY     Status: Normal   Collection Time   08/01/12 11:51 PM      Component Value Range Comment    Glucose-Capillary 71  70 - 99 mg/dL   GLUCOSE, CAPILLARY     Status: Abnormal   Collection Time   08/02/12 12:17 AM      Component Value Range Comment   Glucose-Capillary 51 (*) 70 - 99 mg/dL    Comment 1 Notify RN      Comment 2 Documented in Chart     CBC     Status: Abnormal   Collection Time   08/02/12 12:36 AM      Component Value Range Comment   WBC 22.5 (*) 4.0 - 10.5 K/uL    RBC 3.43 (*) 4.22 - 5.81 MIL/uL    Hemoglobin 8.1 (*) 13.0 - 17.0 g/dL    HCT 16.1 (*) 09.6 - 52.0 %    MCV 66.2 (*) 78.0 - 100.0 fL    MCH 23.6 (*) 26.0 - 34.0 pg    MCHC 35.7  30.0 - 36.0 g/dL    RDW 04.5 (*) 40.9 - 15.5 %    Platelets 204  150 - 400 K/uL   CREATININE, SERUM     Status: Abnormal   Collection Time   08/02/12 12:36 AM      Component Value Range Comment   Creatinine, Ser 4.59 (*) 0.50 - 1.35 mg/dL    GFR calc non Af Amer 12 (*) >90 mL/min    GFR calc Af Amer 14 (*) >90 mL/min   GLUCOSE, CAPILLARY     Status: Abnormal   Collection Time   08/02/12 12:52 AM      Component Value Range Comment   Glucose-Capillary 139 (*) 70 - 99 mg/dL    Comment 1 Notify RN      Comment 2 Documented in Chart     GLUCOSE, CAPILLARY     Status: Normal   Collection Time   08/02/12  2:08 AM      Component Value Range Comment   Glucose-Capillary 80  70 - 99 mg/dL    Comment 1 Notify RN      Comment 2 Documented in Chart     GLUCOSE, CAPILLARY     Status: Abnormal   Collection Time   08/02/12  3:04 AM      Component Value Range Comment   Glucose-Capillary 63 (*) 70 - 99 mg/dL    Comment 1 Notify RN      Comment 2 Documented in Chart     GLUCOSE, CAPILLARY     Status: Abnormal   Collection Time   08/02/12  3:59 AM      Component Value Range Comment   Glucose-Capillary 142 (*) 70 - 99 mg/dL    Comment 1 Notify RN      Comment 2 Documented in Chart     GLUCOSE, CAPILLARY     Status: Abnormal   Collection Time   08/02/12  5:01  AM      Component Value Range Comment   Glucose-Capillary 119 (*) 70 - 99  mg/dL    Comment 1 Notify RN      Comment 2 Documented in Chart     GLUCOSE, CAPILLARY     Status: Abnormal   Collection Time   08/02/12  6:06 AM      Component Value Range Comment   Glucose-Capillary 109 (*) 70 - 99 mg/dL    Comment 1 Notify RN      Comment 2 Documented in Chart     BASIC METABOLIC PANEL     Status: Abnormal   Collection Time   08/02/12  7:00 AM      Component Value Range Comment   Sodium 124 (*) 135 - 145 mEq/L    Potassium 3.3 (*) 3.5 - 5.1 mEq/L DELTA CHECK NOTED   Chloride 89 (*) 96 - 112 mEq/L    CO2 22  19 - 32 mEq/L    Glucose, Bld 83  70 - 99 mg/dL    BUN 59 (*) 6 - 23 mg/dL    Creatinine, Ser 1.19 (*) 0.50 - 1.35 mg/dL    Calcium 8.7  8.4 - 14.7 mg/dL    GFR calc non Af Amer 12 (*) >90 mL/min    GFR calc Af Amer 13 (*) >90 mL/min   CBC     Status: Abnormal   Collection Time   08/02/12  7:00 AM      Component Value Range Comment   WBC 18.9 (*) 4.0 - 10.5 K/uL    RBC 3.34 (*) 4.22 - 5.81 MIL/uL    Hemoglobin 7.6 (*) 13.0 - 17.0 g/dL    HCT 82.9 (*) 56.2 - 52.0 %    MCV 65.9 (*) 78.0 - 100.0 fL    MCH 22.8 (*) 26.0 - 34.0 pg    MCHC 34.5  30.0 - 36.0 g/dL    RDW 13.0 (*) 86.5 - 15.5 %    Platelets 182  150 - 400 K/uL   GLUCOSE, CAPILLARY     Status: Normal   Collection Time   08/02/12  7:01 AM      Component Value Range Comment   Glucose-Capillary 80  70 - 99 mg/dL    Comment 1 Notify RN      Comment 2 Documented in Chart       US Scrotum  08/01/2012  *RADIOLOGY REPORT*  Clinical Data:  67 year old male with right scrotal swelling.  SCROTAL ULTRASOUND DOPPLER ULTRASOUND OF THE TESTICLES  Technique: Complete ultrasound examination of the testicles, epididymis, and other scrotal structures was performed.  Color and spectral Doppler ultrasound were also utilized to evaluate blood flow to the testicles.  Comparison:  None  Findings:  Right testis:  The right testicle is normal in size and echogenicity measuring 4.8 x 2.9 x 3.6 cm.  No focal testicular  masses are identified.  Increased vascularity of the right testicle is noted.  Color Doppler flow and arterial/venous waveforms are noted.  Left testis:  The left testicle is normal in size and echogenicity measuring 4 x 2.7 x 2.6 cm.  There is no evidence of solid mass. Normal color Doppler flow and arterial/venous waveforms are noted.  Right epididymis:  The right epididymis is not enlarged and hypervascular compatible with epididymitis.  Left epididymis:  Small spermatoceles/epididymal cysts are noted.  Hydrocele:  A large right hydrocele is noted. A moderate left hydrocele with internal debris/echoes is noted.  Varicocele:  A left varicocele is present.  Pulsed Doppler interrogation of both testes demonstrates low resistance flow bilaterally.  IMPRESSION: Enlarged right epididymis with increased vascularity in both the right epididymis and right testicle - compatible with right epididymo-orchitis.  Associated large right hydrocele.  Moderate left hydrocele with low level echoes/debris.  No evidence of testicular torsion or focal testicular mass.  Left varicocele.   Original Report Authenticated By: Harmon Pier, M.D.    [place] Art/ven Flow Abd Pelv Doppler  08/01/2012  *RADIOLOGY REPORT*  Clinical Data:  67 year old male with right scrotal swelling.  SCROTAL ULTRASOUND DOPPLER ULTRASOUND OF THE TESTICLES  Technique: Complete ultrasound examination of the testicles, epididymis, and other scrotal structures was performed.  Color and spectral Doppler ultrasound were also utilized to evaluate blood flow to the testicles.  Comparison:  None  Findings:  Right testis:  The right testicle is normal in size and echogenicity measuring 4.8 x 2.9 x 3.6 cm.  No focal testicular masses are identified.  Increased vascularity of the right testicle is noted.  Color Doppler flow and arterial/venous waveforms are noted.  Left testis:  The left testicle is normal in size and echogenicity measuring 4 x 2.7 x 2.6 cm.  There is no  evidence of solid mass. Normal color Doppler flow and arterial/venous waveforms are noted.  Right epididymis:  The right epididymis is not enlarged and hypervascular compatible with epididymitis.  Left epididymis:  Small spermatoceles/epididymal cysts are noted.  Hydrocele:  A large right hydrocele is noted. A moderate left hydrocele with internal debris/echoes is noted.  Varicocele:  A left varicocele is present.  Pulsed Doppler interrogation of both testes demonstrates low resistance flow bilaterally.  IMPRESSION: Enlarged right epididymis with increased vascularity in both the right epididymis and right testicle - compatible with right epididymo-orchitis.  Associated large right hydrocele.  Moderate left hydrocele with low level echoes/debris.  No evidence of testicular torsion or focal testicular mass.  Left varicocele.   Original Report Authenticated By: Harmon Pier, M.D.     Review of Systems  Constitutional: Positive for fever and malaise/fatigue.  HENT: Negative.   Eyes: Negative.   Respiratory: Negative.   Cardiovascular: Negative.   Gastrointestinal: Negative.   Genitourinary: Negative.  Negative for dysuria, urgency, frequency, hematuria and flank pain.       Increasing Rt testicular pain  Musculoskeletal: Negative.   Skin: Negative.   Neurological: Negative.   Endo/Heme/Allergies: Negative.   Psychiatric/Behavioral: Negative.    Blood pressure 125/64, pulse 88, temperature 101.2 F (38.4 C), temperature source Oral, resp. rate 18, height 5\' 11"  (1.803 m), weight 67.178 kg (148 lb 1.6 oz), SpO2 100.00%. Physical Exam  Constitutional: He is oriented to person, place, and time. He appears well-developed and well-nourished.  HENT:  Head: Normocephalic and atraumatic.  Eyes: EOM are normal. Pupils are equal, round, and reactive to light.  Neck: Normal range of motion. Neck supple.  Cardiovascular: Normal rate and regular rhythm.   Respiratory: Effort normal and breath sounds normal.    GI: Soft. Bowel sounds are normal. He exhibits no distension and no mass. There is no tenderness. There is no rebound and no guarding.  Genitourinary: Rectum normal, prostate normal and penis normal.       Rt testicle firm and tender. Non-fluctuent. NO drainage. Reactive hydrocele palpable. Left side unremarkable.  Musculoskeletal: Normal range of motion.  Neurological: He is alert and oriented to person, place, and time.  Skin: Skin is warm and dry.  Psychiatric: He has a normal  mood and affect. His behavior is normal. Judgment and thought content normal.    Assessment/Plan:  1 - Rt Epidiymorchitis - No surgical indications at this point. Agree with IV rocephin as empiric therapy given significant renal impairment.   2 - Elevated PSA - Proceed with biopsy as scheduled 08/2012.  3 - Microscopic Hematuria - s/p eval 07/2012.  4 - Bilateral Renal Masses - Will RX abdominal MRI while in house (no contrast) to help characterize to expedite work-up.  5 - End-Stage Renal Disease - No evidence of obstructive uropathy or acute electrolyte abnormality, suspect medical renal disease given long h/o DM, HTN. Nephrology eval pending, but perhaps can be expedited now that pt in house.   Will follow / call with questions.   Rylie Knierim 08/02/2012, 11:35 AM

## 2012-08-02 NOTE — Progress Notes (Signed)
[facility] PROGRESS NOTE  ELENO WEIMAR WJX:[id number] DOB: 1945/07/24 DOA: 08/01/2012 PCP: No primary provider on file.  Assessment/Plan: 1. Sepsis - 2/2 Epididymo-orchitis, with fever, leukocytosis, and the right testicular infectious source. Patient is not tachycardic however he is on calcium channel blockers. Empirically started ceftriaxone and doxycycline. Patient is not hypotensive at this point. We have discontinued all his antihypertensive medications, however I am continuing lower dose clonidine.  2. Hypoglycemia - it's possible that this is related his infection. I do not suspect exogenous insulin use. He does not have a prescription for insulin, and le doesn't live with anyone diabetic and on insulin.Marland Kitchen He is on fluids that contain dextrose, and we'll continue to monitor.  2. Hyponatremia - I discontinued the D10 since this may contribute to the sodium going from 129-124 overnight, and switched to D5 NS since his hyponatremia appears hypovolemic and he needs some hydration in the setting of having fever. Will consult renal and appreciate their input.  3. acute renal failure, possibly on chronic kidney disease - I suspect that this is somewhat chronic. Given the high PSA and the fact that urology plans for biopsy, is not unreasonable to think that the has a postobstructive etiology. Alternatively, he is on a pretty impressive antihypertensive regimen, which might also point towards hypertensive renal disease. We will also check a hemoglobin A1c to evaluate how well controlled is his diabetes and whether he may have some degree of underlying diabetic nephropathy.   4. Right Epididymo-orchitis  - we have consulted urology and appreciated continuous input. We'll discontinue the ciprofloxacin and start the patient on IV ceftriaxone and doxycycline. He has no surgical indication at this point. We'll continue to monitor.  Code Status: Full Family Communication: none (indicate person spoken  with, relationship, and if by phone, the number) Disposition Plan: Unknown.  Consultants:  Renal  Urology  Procedures:  none  Antibiotics:  Ceftriaxone 1/19 >>  Doxycyline 1/19 >>  HPI/Subjective: No complaints this morning, appreciates his infection to improve.   Objective: Filed Vitals:   08/02/12 0214 08/02/12 0326 08/02/12 0500 08/02/12 1127  BP:   127/57 125/64  Pulse:   79 88  Temp: 102 F (38.9 C) 100.4 F (38 C) 99.8 F (37.7 C) 101.2 F (38.4 C)  TempSrc:    Oral  Resp:   18   Height:      Weight:      SpO2:   99% 100%    Intake/Output Summary (Last 24 hours) at 08/02/12 1148 Last data filed at 08/02/12 1126  Gross per 24 hour  Intake   1579 ml  Output    500 ml  Net   1079 ml   Filed Weights   08/02/12 0006  Weight: 67.178 kg (148 lb 1.6 oz)    Exam:   General:   he is in no apparent distress   Cardiovascular:  heart is regular without murmurs  Respiratory:  lungs are clear to auscultation   Abdomen: abdomen soft and nontender to palpation    GU: Enlarged right testicle. Mild tenderness to palpation. Left side is normal.  Data Reviewed: Basic Metabolic Panel:  Lab 08/02/12 2130 08/02/12 0036 08/01/12 1743  NA 124* -- 129*  K 3.3* -- 4.1  CL 89* -- 88*  CO2 22 -- 23  GLUCOSE 83 -- 71  BUN 59* -- 58*  CREATININE 4.72* 4.59* 4.61*  CALCIUM 8.7 -- 9.3  MG -- -- --  PHOS -- -- --   CBC:  Lab 08/02/12 0700 08/02/12 0036 08/01/12 1743  WBC 18.9* 22.5* 23.8*  NEUTROABS -- -- 22.2*  HGB 7.6* 8.1* 8.8*  HCT 22.0* 22.7* 25.7*  MCV 65.9* 66.2* 66.9*  PLT 182 204 203   CBG:  Lab 08/02/12 0701 08/02/12 0606 08/02/12 0501 08/02/12 0359 08/02/12 0304  GLUCAP 80 109* 119* 142* 63*    Recent Results (from the past 240 hour(s))  GRAM STAIN     Status: Normal   Collection Time   08/01/12  6:33 PM      Component Value Range Status Comment   Specimen Description URINE, CLEAN CATCH   Final    Special Requests NONE   Final    Gram  Stain     Final    Value: CYTOSPIN SLIDE     WBC PRESENT,BOTH PMN AND MONONUCLEAR     NEGATIVE FOR BACTERIA   Report Status 08/01/2012 FINAL   Final      Studies: US Scrotum  08/01/2012  *RADIOLOGY REPORT*  Clinical Data:  67 year old male with right scrotal swelling.  SCROTAL ULTRASOUND DOPPLER ULTRASOUND OF THE TESTICLES  Technique: Complete ultrasound examination of the testicles, epididymis, and other scrotal structures was performed.  Color and spectral Doppler ultrasound were also utilized to evaluate blood flow to the testicles.  Comparison:  None  Findings:  Right testis:  The right testicle is normal in size and echogenicity measuring 4.8 x 2.9 x 3.6 cm.  No focal testicular masses are identified.  Increased vascularity of the right testicle is noted.  Color Doppler flow and arterial/venous waveforms are noted.  Left testis:  The left testicle is normal in size and echogenicity measuring 4 x 2.7 x 2.6 cm.  There is no evidence of solid mass. Normal color Doppler flow and arterial/venous waveforms are noted.  Right epididymis:  The right epididymis is not enlarged and hypervascular compatible with epididymitis.  Left epididymis:  Small spermatoceles/epididymal cysts are noted.  Hydrocele:  A large right hydrocele is noted. A moderate left hydrocele with internal debris/echoes is noted.  Varicocele:  A left varicocele is present.  Pulsed Doppler interrogation of both testes demonstrates low resistance flow bilaterally.  IMPRESSION: Enlarged right epididymis with increased vascularity in both the right epididymis and right testicle - compatible with right epididymo-orchitis.  Associated large right hydrocele.  Moderate left hydrocele with low level echoes/debris.  No evidence of testicular torsion or focal testicular mass.  Left varicocele.   Original Report Authenticated By: Harmon Pier, M.D.    [place] Art/ven Flow Abd Pelv Doppler  08/01/2012  *RADIOLOGY REPORT*  Clinical Data:  67 year old male with  right scrotal swelling.  SCROTAL ULTRASOUND DOPPLER ULTRASOUND OF THE TESTICLES  Technique: Complete ultrasound examination of the testicles, epididymis, and other scrotal structures was performed.  Color and spectral Doppler ultrasound were also utilized to evaluate blood flow to the testicles.  Comparison:  None  Findings:  Right testis:  The right testicle is normal in size and echogenicity measuring 4.8 x 2.9 x 3.6 cm.  No focal testicular masses are identified.  Increased vascularity of the right testicle is noted.  Color Doppler flow and arterial/venous waveforms are noted.  Left testis:  The left testicle is normal in size and echogenicity measuring 4 x 2.7 x 2.6 cm.  There is no evidence of solid mass. Normal color Doppler flow and arterial/venous waveforms are noted.  Right epididymis:  The right epididymis is not enlarged and hypervascular compatible with epididymitis.  Left epididymis:  Small spermatoceles/epididymal  cysts are noted.  Hydrocele:  A large right hydrocele is noted. A moderate left hydrocele with internal debris/echoes is noted.  Varicocele:  A left varicocele is present.  Pulsed Doppler interrogation of both testes demonstrates low resistance flow bilaterally.  IMPRESSION: Enlarged right epididymis with increased vascularity in both the right epididymis and right testicle - compatible with right epididymo-orchitis.  Associated large right hydrocele.  Moderate left hydrocele with low level echoes/debris.  No evidence of testicular torsion or focal testicular mass.  Left varicocele.   Original Report Authenticated By: Harmon Pier, M.D.     Scheduled Meds:   . cefTRIAXone (ROCEPHIN)  IV  1 g Intravenous Q24H  . cloNIDine  0.1 mg Oral BID  . doxycycline  50 mg Oral Q12H  . enoxaparin (LOVENOX) injection  30 mg Subcutaneous Q24H  . labetalol  200 mg Oral BID  . potassium chloride  20 mEq Oral Once  . sodium chloride  3 mL Intravenous Q12H   Continuous Infusions:   . dextrose 100  mL/hr at 08/02/12 2956    Principal Problem:  *Hypoglycemia Active Problems:  Diabetes mellitus without complication  Hypertension  Acute renal failure  CKD (chronic kidney disease)  Epididymitis   Pamella Pert  [facility] Pager [phone]. If 7PM-7AM, please contact night-coverage at [email or web address], password [facility] 08/02/2012, 11:48 AM  LOS: 1 day

## 2012-08-02 NOTE — Progress Notes (Signed)
CBG=99 and last hourly CBG was 94.  Will change cks to every 2 hrs.

## 2012-08-02 NOTE — Progress Notes (Signed)
Continued communication with MD, ords carried out
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
## 2012-08-02 NOTE — Progress Notes (Signed)
CRITICAL VALUE ALERT  Critical value received:  51  Date of notification: 07/1912  Time of notification          0015  Critical value read back:yes  Nurse who received alert: Zion Lint  MD notified (1st page):  [email or web address]  Time of first page:  0015  MD notified (2nd page):  Time of second page:  Responding MD: Onalee Hua  Time MD responded: [phone]

## 2012-08-02 NOTE — Progress Notes (Signed)
CRITICAL VALUE ALERT  Critical value received:  12  Date of notification: 08/02/2012  Time of notification:0330  Critical value read back:yes  Nurse who received alert:  Leandro Berkowitz  MD notified (1st page):  [email or web address]  Time of first page:  0330  MD notified (2nd page):  Time of second page:  Responding MD:  harduk  Time MD responded:  0040

## 2012-08-03 ENCOUNTER — Inpatient Hospital Stay (HOSPITAL_COMMUNITY): Payer: Medicare Other

## 2012-08-03 ENCOUNTER — Encounter (HOSPITAL_COMMUNITY): Payer: Self-pay | Admitting: *Deleted

## 2012-08-03 DIAGNOSIS — N19 Unspecified kidney failure: Secondary | ICD-10-CM

## 2012-08-03 DIAGNOSIS — N186 End stage renal disease: Secondary | ICD-10-CM

## 2012-08-03 LAB — IRON AND TIBC
Iron: <10 ug/dL — ABNORMAL LOW (ref 42–135)
UIBC: 279 ug/dL (ref 125–400)

## 2012-08-03 LAB — BASIC METABOLIC PANEL
BUN: 66 mg/dL — ABNORMAL HIGH (ref 6–23)
BUN: 72 mg/dL — ABNORMAL HIGH (ref 6–23)
CO2: 24 mEq/L (ref 19–32)
CO2: 23 mEq/L (ref 19–32)
Calcium: 9.1 mg/dL (ref 8.4–10.5)
Chloride: 87 mEq/L — ABNORMAL LOW (ref 96–112)
Chloride: 92 mEq/L — ABNORMAL LOW (ref 96–112)
Creatinine, Ser: 4.77 mg/dL — ABNORMAL HIGH (ref 0.50–1.35)
Creatinine, Ser: 4.61 mg/dL — ABNORMAL HIGH (ref 0.50–1.35)
GFR calc Af Amer: 14 mL/min — ABNORMAL LOW (ref >90)
GFR calc Af Amer: 13 mL/min — ABNORMAL LOW (ref >90)
GFR calc Af Amer: 14 mL/min — ABNORMAL LOW (ref >90)
GFR calc Af Amer: 13 mL/min — ABNORMAL LOW (ref >90)
GFR calc non Af Amer: 11 mL/min — ABNORMAL LOW (ref >90)
GFR calc non Af Amer: 11 mL/min — ABNORMAL LOW (ref >90)
Glucose, Bld: 215 mg/dL — ABNORMAL HIGH (ref 70–99)
Potassium: 3.7 mEq/L (ref 3.5–5.1)
Potassium: 3.9 mEq/L (ref 3.5–5.1)
Sodium: 124 mEq/L — ABNORMAL LOW (ref 135–145)
Sodium: 130 mEq/L — ABNORMAL LOW (ref 135–145)
Sodium: 130 mEq/L — ABNORMAL LOW (ref 135–145)

## 2012-08-03 LAB — PTH, INTACT AND CALCIUM
Calcium, Total (PTH): 8 mg/dL — ABNORMAL LOW (ref 8.4–10.5)
PTH: 176.6 pg/mL — ABNORMAL HIGH (ref 14.0–72.0)

## 2012-08-03 LAB — GLUCOSE, CAPILLARY
Glucose-Capillary: 115 mg/dL — ABNORMAL HIGH (ref 70–99)
Glucose-Capillary: 242 mg/dL — ABNORMAL HIGH (ref 70–99)
Glucose-Capillary: 273 mg/dL — ABNORMAL HIGH (ref 70–99)
Glucose-Capillary: 277 mg/dL — ABNORMAL HIGH (ref 70–99)
Glucose-Capillary: 205 mg/dL — ABNORMAL HIGH (ref 70–99)
Glucose-Capillary: 145 mg/dL — ABNORMAL HIGH (ref 70–99)

## 2012-08-03 LAB — OSMOLALITY: Osmolality: 291 mOsm/kg (ref 275–300)

## 2012-08-03 LAB — FERRITIN: Ferritin: 165 ng/mL (ref 22–322)

## 2012-08-03 LAB — URINE CULTURE: Colony Count: NO GROWTH

## 2012-08-03 MED ORDER — FUROSEMIDE 40 MG PO TABS
40.0000 mg | ORAL_TABLET | Freq: Two times a day (BID) | ORAL | Status: DC
  Administered 2012-08-03 – 2012-08-04 (×2): 40 mg via ORAL
  Filled 2012-08-03 (×4): qty 1

## 2012-08-03 MED ORDER — FERUMOXYTOL INJECTION 510 MG/17 ML
510.0000 mg | Freq: Once | INTRAVENOUS | Status: AC
  Administered 2012-08-03: 510 mg via INTRAVENOUS
  Filled 2012-08-03 (×2): qty 17

## 2012-08-03 MED ORDER — INSULIN ASPART 100 UNIT/ML ~~LOC~~ SOLN: 0.0000 [IU] | Freq: Every day | SUBCUTANEOUS | Status: DC

## 2012-08-03 MED ORDER — INSULIN ASPART 100 UNIT/ML ~~LOC~~ SOLN
0.0000 [IU] | Freq: Three times a day (TID) | SUBCUTANEOUS | Status: DC
  Administered 2012-08-03: 5 [IU] via SUBCUTANEOUS
  Administered 2012-08-04: 3 [IU] via SUBCUTANEOUS
  Administered 2012-08-04: 2 [IU] via SUBCUTANEOUS

## 2012-08-03 MED ORDER — ZOLPIDEM TARTRATE 5 MG PO TABS
5.0000 mg | ORAL_TABLET | Freq: Once | ORAL | Status: AC
  Administered 2012-08-03: 5 mg via ORAL
  Filled 2012-08-03: qty 1

## 2012-08-03 NOTE — Progress Notes (Signed)
[facility] PROGRESS NOTE  ABDIMALIK MAYORQUIN ZOX:[id number] DOB: January 03, 1946 DOA: 08/01/2012 PCP: Pcp Not In System  Assessment/Plan: 1. Sepsis - 2/2 Epididymo-orchitis, with fever, leukocytosis, and the right testicular infectious source. Patient is not tachycardic however he is on calcium channel blockers. Empirically started ceftriaxone and doxycycline. Patient is not hypotensive at this point. We have discontinued all his antihypertensive medications, however I am continuing lower dose clonidine. - will stop the ceftriaxone on d/c and continue oral doxycycline for 10 more days. if his sodium remains stable, he may be discharged tomorrow.   2. Hypoglycemia - it's possible that this is related his infection. I do not suspect exogenous insulin use. He does not have a prescription for insulin, and le doesn't live with anyone diabetic and on insulin..  - His hypoglycemia resolved, he is in fact now hyperglycemic. We have started sliding scale insulin today.  2. Hyponatremia - he has stabilized between 128 and 130. He is now on 1500 mL fluid restriction per day  3. acute renal failure, possibly on chronic kidney disease - nephrology is following, we appreciate their input. vascular surgery was consulted for vein mapping in the light of the patient needing dialysis soon.  4. Right Epididymo-orchitis  - greatly improved today - will stop the ceftriaxone on d/c and continue oral doxycycline for 10 more days.  Code Status: Full Family Communication: none (indicate person spoken with, relationship, and if by phone, the number) Disposition Plan: Unknown.  Consultants:  Renal  Urology  Procedures:  none  Antibiotics:  Ceftriaxone 1/19 >>  Doxycyline 1/19 >>  HPI/Subjective: No complaints this morning, appreciates his infection to improve.   Objective: Filed Vitals:   08/02/12 1544 08/02/12 1830 08/02/12 2101 08/03/12 1348  BP: 126/63 134/65 131/59 124/63  Pulse: 69 75 88 71    Temp: 99.2 F (37.3 C) 99.7 F (37.6 C) 100.2 F (37.9 C) 98.3 F (36.8 C)  TempSrc: Oral Oral Oral Oral  Resp:  18 18 18   Height:      Weight:      SpO2: 100% 100% 100% 100%    Intake/Output Summary (Last 24 hours) at 08/03/12 1649 Last data filed at 08/03/12 1348  Gross per 24 hour  Intake    360 ml  Output   2520 ml  Net  -2160 ml   Filed Weights   08/02/12 0006  Weight: 67.178 kg (148 lb 1.6 oz)    Exam:   General:   he is in no apparent distress   Cardiovascular:  heart is regular without murmurs  Respiratory:  lungs are clear to auscultation   Abdomen: abdomen soft and nontender to palpation    GU:  minimally Enlarged right testicle, much improved since yesterday. Mild tenderness to palpation. Left side is normal.  Data Reviewed: Basic Metabolic Panel:  Lab 08/03/12 8119 08/03/12 0617 08/03/12 0014 08/02/12 1747 08/02/12 1358  NA 128* 130* 124* 125* 124*  K 3.7 3.8 3.5 3.5 3.4*  CL 92* 93* 87* 89* 87*  CO2 23 23 24 23 23   GLUCOSE 215* 135* 115* 60* 105*  BUN 66* 65* 62* 58* 59*  CREATININE 4.61* 4.77* 4.65* 4.68* 4.64*  CALCIUM 8.8 9.1 8.7 8.0*9.0 8.7  MG -- -- -- -- --  PHOS -- -- -- 3.8 --   CBC:  Lab 08/02/12 0700 08/02/12 0036 08/01/12 1743  WBC 18.9* 22.5* 23.8*  NEUTROABS -- -- 22.2*  HGB 7.6* 8.1* 8.8*  HCT 22.0* 22.7* 25.7*  MCV 65.9*  66.2* 66.9*  PLT 182 204 203   CBG:  Lab 2012/08/09 1639 2012-08-09 1209 09-Aug-2012 1002 08-09-2012 0758 2012-08-09 0441  GLUCAP 273* 200* 242* 119* 129*    Recent Results (from the past 240 hour(s))  URINE CULTURE     Status: Normal   Collection Time   08/01/12  6:33 PM      Component Value Range Status Comment   Specimen Description URINE, CLEAN CATCH   Final    Special Requests NONE   Final    Culture  Setup Time 08/02/2012 02:55   Final    Colony Count NO GROWTH   Final    Culture NO GROWTH   Final    Report Status 08-09-12 FINAL   Final   GRAM STAIN     Status: Normal   Collection Time    08/01/12  6:33 PM      Component Value Range Status Comment   Specimen Description URINE, CLEAN CATCH   Final    Special Requests NONE   Final    Gram Stain     Final    Value: CYTOSPIN SLIDE     WBC PRESENT,BOTH PMN AND MONONUCLEAR     NEGATIVE FOR BACTERIA   Report Status 08/01/2012 FINAL   Final   CULTURE, BLOOD (ROUTINE X 2)     Status: Normal (Preliminary result)   Collection Time   08/02/12  8:35 AM      Component Value Range Status Comment   Specimen Description BLOOD ARM RIGHT   Final    Special Requests BOTTLES DRAWN AEROBIC AND ANAEROBIC 10CC EACH   Final    Culture  Setup Time 08/02/2012 18:56   Final    Culture     Final    Value:        BLOOD CULTURE RECEIVED NO GROWTH TO DATE CULTURE WILL BE HELD FOR 5 DAYS BEFORE ISSUING A FINAL NEGATIVE REPORT   Report Status PENDING   Incomplete   CULTURE, BLOOD (ROUTINE X 2)     Status: Normal (Preliminary result)   Collection Time   08/02/12  8:40 AM      Component Value Range Status Comment   Specimen Description BLOOD HAND RIGHT   Final    Special Requests BOTTLES DRAWN AEROBIC AND ANAEROBIC 10CC EACH   Final    Culture  Setup Time 08/02/2012 18:57   Final    Culture     Final    Value:        BLOOD CULTURE RECEIVED NO GROWTH TO DATE CULTURE WILL BE HELD FOR 5 DAYS BEFORE ISSUING A FINAL NEGATIVE REPORT   Report Status PENDING   Incomplete      Studies: Mr Abdomen Wo Contrast  08/09/12  *RADIOLOGY REPORT*  Clinical Data: Bilateral renal lesions.  MRI ABDOMEN WITHOUT CONTRAST  Technique:  Multiplanar multisequence MR imaging of the abdomen was performed. No intravenous contrast was administered.  Comparison: CT scan 07/21/2012.  Findings: There are innumerable bilateral renal cysts.  All these measure less than 2 cm in size.  Several of these demonstrate high T1 signal intensity consistent with hemorrhagic cyst.  A lower pole cyst on the right anteriorly appears to have at least 102 septations but other than map I do not see any  worrisome renal lesions that would suggest neoplasm.  No hydronephrosis.  Normal renal cortical thickness.  The liver is grossly normal.  No biliary dilatation.  The gallbladder is normal.  No common bile duct dilatation.  The pancreatic duct is normal.  The pancreas is normal.  The spleen is normal in size.  The adrenal glands are normal.  The aorta is normal in caliber.  No mesenteric or retroperitoneal mass or adenopathy.  The bony structures are grossly normal.  IMPRESSION:  Innumerable small bilateral renal cysts some which are complicated by hemorrhage and septations.  No worrisome MR imaging features to suggest renal neoplasm.  Exam is limited without IV contrast but I think 1-year follow-up MR examination would be adequate to assess stability.   Original Report Authenticated By: Rudie Meyer, M.D.    US Scrotum  08/01/2012  *RADIOLOGY REPORT*  Clinical Data:  67 year old male with right scrotal swelling.  SCROTAL ULTRASOUND DOPPLER ULTRASOUND OF THE TESTICLES  Technique: Complete ultrasound examination of the testicles, epididymis, and other scrotal structures was performed.  Color and spectral Doppler ultrasound were also utilized to evaluate blood flow to the testicles.  Comparison:  None  Findings:  Right testis:  The right testicle is normal in size and echogenicity measuring 4.8 x 2.9 x 3.6 cm.  No focal testicular masses are identified.  Increased vascularity of the right testicle is noted.  Color Doppler flow and arterial/venous waveforms are noted.  Left testis:  The left testicle is normal in size and echogenicity measuring 4 x 2.7 x 2.6 cm.  There is no evidence of solid mass. Normal color Doppler flow and arterial/venous waveforms are noted.  Right epididymis:  The right epididymis is not enlarged and hypervascular compatible with epididymitis.  Left epididymis:  Small spermatoceles/epididymal cysts are noted.  Hydrocele:  A large right hydrocele is noted. A moderate left hydrocele with  internal debris/echoes is noted.  Varicocele:  A left varicocele is present.  Pulsed Doppler interrogation of both testes demonstrates low resistance flow bilaterally.  IMPRESSION: Enlarged right epididymis with increased vascularity in both the right epididymis and right testicle - compatible with right epididymo-orchitis.  Associated large right hydrocele.  Moderate left hydrocele with low level echoes/debris.  No evidence of testicular torsion or focal testicular mass.  Left varicocele.   Original Report Authenticated By: Harmon Pier, M.D.    [place] Art/ven Flow Abd Pelv Doppler  08/01/2012  *RADIOLOGY REPORT*  Clinical Data:  67 year old male with right scrotal swelling.  SCROTAL ULTRASOUND DOPPLER ULTRASOUND OF THE TESTICLES  Technique: Complete ultrasound examination of the testicles, epididymis, and other scrotal structures was performed.  Color and spectral Doppler ultrasound were also utilized to evaluate blood flow to the testicles.  Comparison:  None  Findings:  Right testis:  The right testicle is normal in size and echogenicity measuring 4.8 x 2.9 x 3.6 cm.  No focal testicular masses are identified.  Increased vascularity of the right testicle is noted.  Color Doppler flow and arterial/venous waveforms are noted.  Left testis:  The left testicle is normal in size and echogenicity measuring 4 x 2.7 x 2.6 cm.  There is no evidence of solid mass. Normal color Doppler flow and arterial/venous waveforms are noted.  Right epididymis:  The right epididymis is not enlarged and hypervascular compatible with epididymitis.  Left epididymis:  Small spermatoceles/epididymal cysts are noted.  Hydrocele:  A large right hydrocele is noted. A moderate left hydrocele with internal debris/echoes is noted.  Varicocele:  A left varicocele is present.  Pulsed Doppler interrogation of both testes demonstrates low resistance flow bilaterally.  IMPRESSION: Enlarged right epididymis with increased vascularity in both the right  epididymis and right testicle - compatible with right  epididymo-orchitis.  Associated large right hydrocele.  Moderate left hydrocele with low level echoes/debris.  No evidence of testicular torsion or focal testicular mass.  Left varicocele.   Original Report Authenticated By: Harmon Pier, M.D.     Scheduled Meds:    . cefTRIAXone (ROCEPHIN)  IV  1 g Intravenous Q24H  . cloNIDine  0.1 mg Oral BID  . doxycycline  50 mg Oral Q12H  . ferumoxytol  510 mg Intravenous Once  . furosemide  40 mg Oral BID  . heparin subcutaneous  5,000 Units Subcutaneous Q8H  . insulin aspart  0-5 Units Subcutaneous QHS  . insulin aspart  0-9 Units Subcutaneous TID WC  . labetalol  200 mg Oral BID  . sodium chloride  3 mL Intravenous Q12H   Continuous Infusions:   Principal Problem:  *Hypoglycemia Active Problems:  Diabetes mellitus without complication  Hypertension  Acute renal failure  CKD (chronic kidney disease)  Epididymitis  Epididymo-orchitis, acute  Sepsis  Hyponatremia   Pamella Pert  [facility] Pager [phone]. If 7PM-7AM, please contact night-coverage at [email or web address], password [facility] 08/03/2012, 4:49 PM  LOS: 2 days

## 2012-08-03 NOTE — ED Provider Notes (Signed)
Medical screening examination/treatment/procedure(s) were conducted as a shared visit with non-physician practitioner(s) and myself.  I personally evaluated the patient during the encounter.  Patient seen for evaluation of hypoglycemia. It is felt that the patient has had refractory hypoglycemia secondary to infection. Patient has evidence of epididymitis that is not responding to outpatient therapy. He was admitted for further treatment.  Gilda Crease, MD 08/03/12 [phone]

## 2012-08-03 NOTE — Progress Notes (Signed)
VASCULAR LAB PRELIMINARY  PRELIMINARY  PRELIMINARY  PRELIMINARY  Right  Upper Extremity Vein Map    Cephalic  Segment Diameter Depth Comment  1. Axilla 1.79 mm mm   2. Mid upper arm 1.41 mm mm   3. Above AC 1.83 mm mm   4. In AC 1.5 mm mm   5. Below AC 1.29 mm mm   6. Mid forearm 1.58 mm mm   7. Wrist 1.83 mm mm    mm mm    mm mm    mm mm    Basilic  Segment Diameter Depth Comment  1. Axilla 2.74 mm 10 mm   2. Mid upper arm 2.04 mm 0.7 mm   3. Above AC mm mm   4. In AC 1.25 mm 0.33 mm   5. Below AC mm mm   6. Mid forearm mm mm   7. Wrist mm mm    mm mm    mm mm    mm mm    Left Upper Extremity Vein Map    Cephalic  Segment Diameter Depth Comment  1. Axilla 1..58 mm mm   2. Mid upper arm 1.88mm mm   3. Above AC 1.25 mm mm   4. In AC 3.2 mm mm   5. Below AC 4.4 mm mm   6. Mid forearm 3.45mm mm  Branch  7. Low forearm 4.28 mm mm  IV at wrist   mm mm    mm mm    mm mm    Basilic  Segment Diameter Depth Comment  1. Axilla mm mm   2. Mid upper arm mm mm   3. Above AC 4.82 mm 4.17 mm   4. In AC mm mm   5. Below AC mm mm   6. Mid forearm mm mm   7. Wrist mm mm    mm mm    mm mm    mm mm      Tim Armstrong, RVT 08/03/2012, 3:37 PM

## 2012-08-03 NOTE — Progress Notes (Signed)
Off telemetry for MRI , no CBG for this hr.

## 2012-08-03 NOTE — Consult Note (Signed)
[facility] Consult  Reason for Consult:  ESRD Referring Physician:  Briant Cedar [id number]  History of Present Illness: This is a 67 y.o. male who presented to the ED a couple of days ago with swollen and painful right testicle and was placed on Cipro for epididymitis.  He also had altered mental status as well as hypoglycemia with his glucose running in the 40's at home.  He also has an elevated PSA for which he is scheduled for Bx next month.    He does have CKD and his recent creatinine's have been in the 4 range.  He states that he has poor appetite.  He has had nausea and an episode of vomiting, but no abdominal pain.  He denies SOB, CP or LE edema.  He does state that he has had some dizziness when sitting up in bed in the morning.  His metformin and ARB have been discontinued.  He has also had hyponatremia.   VVS is consulted for permanent access with HD needed in the near future.  Vein mapping has been ordered and is pending.  Past Medical History  Diagnosis Date  . Diabetes mellitus without complication   . Hypertension    History reviewed. No pertinent past surgical history.  No Known Allergies  Prior to Admission medications   Medication Sig Start Date End Date Taking? Authorizing Provider  amLODipine (NORVASC) 10 MG tablet Take 10 mg by mouth daily.   Yes Historical Provider, MD  ciprofloxacin (CIPRO) 250 MG tablet Take 250 mg by mouth daily.   Yes Historical Provider, MD  cloNIDine (CATAPRES) 0.2 MG tablet Take 0.2 mg by mouth 2 (two) times daily.   Yes Historical Provider, MD  diltiazem (TIAZAC) 240 MG 24 hr capsule Take 240 mg by mouth daily.   Yes Historical Provider, MD  glipiZIDE (GLUCOTROL) 10 MG tablet Take 10 mg by mouth 2 (two) times daily before a meal.   Yes Historical Provider, MD  hydrALAZINE (APRESOLINE) 25 MG tablet Take 25 mg by mouth 4 (four) times daily.   Yes Historical Provider, MD  labetalol (NORMODYNE) 200 MG tablet Take 200 mg by mouth  2 (two) times daily.   Yes Historical Provider, MD  losartan-hydrochlorothiazide (HYZAAR) 100-25 MG per tablet Take 1 tablet by mouth daily.   Yes Historical Provider, MD  metFORMIN (GLUCOPHAGE) 500 MG tablet Take 500 mg by mouth 3 (three) times daily.   Yes Historical Provider, MD  potassium chloride (K-DUR) 10 MEQ tablet Take 10 mEq by mouth 2 (two) times daily.   Yes Historical Provider, MD    History   Social History  . Marital Status: Single    Spouse Name: N/A    Number of Children: N/A  . Years of Education: N/A   Occupational History  . Not on file.   Social History Main Topics  . Smoking status: Former Smoker    Types: Cigarettes    Quit date: 08/04/2003  . Smokeless tobacco: Not on file  . Alcohol Use:   . Drug Use:   . Sexually Active:    Other Topics Concern  . Not on file   Social History Narrative  . No narrative on file    History reviewed. No pertinent family history.  ROS: [x]  Positive   [ ]  Negative   [ ]  All sytems reviewed and are negative  General: [ ]  Weight loss, [ ]  Weight gain, [ ]   Loss of appetite, [ ]  Fever Neurologic: [ x] Dizziness, [ ]   Blackouts, [ ]  Headaches, [ ]  Seizure, Stroke, [ ]  "Mini stroke", [ ]  Slurred speech, [ ]  Temporary blindness Ear/Nose/Throat: [ ]  Change in eyesight, [ ]  Change in hearing, [ ]  Nose bleeds, [ ]  Sore throat Vascular: [ ]  Pain in legs with walking, [ ]  Pain in feet while lying flat, [ ]  Non-healing ulcer,  [ ]  Blood clot in vein, [ ]  Phlebitis Pulmonary: [ ]  Home oxygen, [ ]  Productive cough, [ ]  Bronchitis, [ ]  Coughing up blood,  [ ]  Asthma, [ ]  Wheezing Musculoskeletal: [ ]  Arthritis, [ ]  Joint pain, [ ]  Muscle pain Cardiac: [ ]  Chest pain, [ ]  Chest tightness/pressure, [ ]  Shortness of breath when lying flat, [ ]  Shortness of breath with exertion, [ ]  Palpitations, [ ]  Heart murmur, [ ]  Arrythmia,  [ ]  Atrial fibrillation Hematologic: [ ]  Bleeding problems, [ ]  Clotting disorder, [ ]  Anemia Psychiatric:   [ ]  Depression, [ ]  Anxiety Gastrointestinal:  [ ]  Black stool,[ ]   Blood in stool, [ ]  Peptic ulcer disease, [ ]  Reflux, [ ]  Hiatal hernia, [ ]  Trouble swallowing, [ ]  Diarrhea, [ ]  Constipation Urinary:  [x ] Kidney disease, [ ]  Burning with urination, [ ]  nocturia, [ ]  Difficulty urinating; [x]  swollen and sore testicle; [x]  elevated PSA Endocrine: [x ] hx diabetes with recent episodes of low blood sugar, [ ]  hx thyroid disease;  Skin: [ ]  Ulcers, [ ]  Rashes   Physical Examination  Filed Vitals:   08/03/12 1348  BP: 124/63  Pulse: 71  Temp: 98.3 F (36.8 C)  Resp: 18   Body mass index is 20.66 kg/(m^2).  General:  WDWN in NAD Gait: Normal HENT: WNL Eyes: Pupils equal Pulmonary: normal non-labored breathing , without Rales, rhonchi,  wheezing Cardiac: RRR, without  Murmurs, rubs or gallops; No carotid bruits Abdomen: soft, NT, no masses Skin: no rashes, ulcers noted Vascular Exam/Pulses: 2+ radial pulses bilaterally. Very small surface veins on the right arm. Moderate antecubital vein on the left and moderate cephalic vein in the forearm. There is an IV in the distal cephalic vein just above the wrist. Musculoskeletal: no muscle wasting or atrophy  Neurologic: A&O X 3; Appropriate Affect ; SENSATION: normal; MOTOR FUNCTION:  moving all extremities equally. Speech is fluent/normal   CBC    Component Value Date/Time   WBC 18.9* 08/02/2012 0700   WBC 5.1 06/22/2010 1603   RBC 3.34* 08/02/2012 0700   RBC 4.36 06/22/2010 1603   HGB 7.6* 08/02/2012 0700   HGB 10.9* 06/22/2010 1603   HCT 22.0* 08/02/2012 0700   HCT 33.3* 06/22/2010 1603   PLT 182 08/02/2012 0700   PLT 235 06/22/2010 1603   MCV 65.9* 08/02/2012 0700   MCV 76.3* 06/22/2010 1603   MCH 22.8* 08/02/2012 0700   MCH 25.1* 06/22/2010 1603   MCHC 34.5 08/02/2012 0700   MCHC 32.8 06/22/2010 1603   RDW 17.7* 08/02/2012 0700   RDW 15.4* 06/22/2010 1603   LYMPHSABS 0.4* 08/01/2012 1743   LYMPHSABS 1.1 06/22/2010 1603   MONOABS 1.2*  08/01/2012 1743   MONOABS 0.5 06/22/2010 1603   EOSABS 0.0 08/01/2012 1743   EOSABS 0.2 06/22/2010 1603   BASOSABS 0.0 08/01/2012 1743   BASOSABS 0.0 06/22/2010 1603    BMET    Component Value Date/Time   NA 128* 08/03/2012 1050   K 3.7 08/03/2012 1050   CL 92* 08/03/2012 1050   CO2 23 08/03/2012 1050   GLUCOSE 215* 08/03/2012 1050  BUN 66* 08/03/2012 1050   CREATININE 4.61* 08/03/2012 1050   CREATININE 3.23* 04/23/2010 1149   CALCIUM 8.8 08/03/2012 1050   CALCIUM 8.0* 08/02/2012 1747   GFRNONAA 12* 08/03/2012 1050   GFRAA 14* 08/03/2012 1050     Non-Invasive Vascular Imaging:  UE vein mapping is pending   ASSESSMENT/PLAN: This is a 67 y.o. 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  -will await results of vein mapping. -may need to wait for access placement until epididymitis improves as WBC yesterday was 18.9. -d/c IV in left arm and place restricted extremity bracelet on left arm (ordered) -may place hep lock IV in right arm.   Doreatha Massed, PA-C [facility] [phone]  I have examined the patient, reviewed and agree with above. I have reviewed the vein map. This does show small cephalic and basilic vein on the right. The patient is right-hand dominant. He does have a good caliber cephalic vein from the wrist to the antecubital space on the left. He does have an IV in the left cephalic vein which we will remove them have a hep well placed on the right arm. I discussed this with the patient explaining options for hemodialysis to include catheters, AV fistula an AV graft. He does appear to be a candidate for a left Cimino AV fistula. I explained that this could be done as an inpatient or outpatient. He cannot be placed on the elective schedule until later in the week. We will follow and plan AV fistula later in the week if it inpatient or outpatient  Hiedi Touchton, MD 08/03/2012 3:52 PM

## 2012-08-03 NOTE — Progress Notes (Signed)
Inpatient Diabetes Program Recommendations  AACE/ADA: New Consensus Statement on Inpatient Glycemic Control (2013)  Target Ranges:  Prepandial:   less than 140 mg/dL      Peak postprandial:   less than 180 mg/dL (1-2 hours)      Critically ill patients:  140 - 180 mg/dL   Results for ELIUD, POLO (MRN [id number]) as of 08/03/2012 13:20  Ref. Range 08/02/2012 20:33 08/02/2012 22:33 08/03/2012 00:32 08/03/2012 02:42 08/03/2012 04:41 08/03/2012 07:58 08/03/2012 10:02 08/03/2012 12:09  Glucose-Capillary Latest Range: 70-99 mg/dL 91 782 (H) 956 (H) 213 (H) 129 (H) 119 (H) 242 (H) 200 (H)    Inpatient Diabetes Program Recommendations Correction (SSI): Please consider changing CBGs to ACHS and order Novolog correction scale. Diet: May want to consider adding Carb Modified ADA diabetic to diet comment to current Heart Healthy diet order.  Note: Patient has a history of diabetes and takes Glipizide 10mg  BID and Metformin 500mg  TID at home for diabetes management.  Patient was hypoglycemic on arrival to the hospital, therefore, CBGs have been checked Q2H.  Now that blood glucose has began to increase > 200mg /dl please consider check CBGs ACHS with Novolog correction and adding Carb Modified to current Heart Healthy diet order.  Will continue to follow.  Thanks, Orlando Penner, RN, BSN, CCRN Diabetes Coordinator Inpatient Diabetes Program [phone]

## 2012-08-03 NOTE — Progress Notes (Signed)
Rn paged vascular PA regarding IV placement. Most recent order says to d/c IV that is in left arm and place new IV in right. Previous order says the exact opposite. This will be the 3rd IV stick for this patient in less that 24 hours. Will await conversation with Doristine Church PA before removing another IV.

## 2012-08-03 NOTE — Progress Notes (Signed)
Subjective:  1 - Rt Epidiymorchitis - Pt with 1 week history of worsening rt scrotal pain. No dysuria. UA negative. Was placed on empiric Cipro last week for suspected epidiymorchitis, but then developed fevers and hypoglycemia dn admitted to [facility]. Placed on IV rocephin for further therapy. Scrotal US on admissino confirms rt epididymorchitis w/o abscess and reactive hydrocele.   2 - Elevated PSA - Random PSA of 5.7 07/2012 on eval for hematuria. His father had prostate cancer. He has biopsy scheduled and pending 08/2012 at [facility].   3 - Microscopic Hematuria - Had cysto and CT 07/2012 which revealed large friable prostate as well as small bilateral renal masses as per below. No gross episodes.   4 - Bilateral Renal Masses - Bilateral small renal masses, incompletely characterized by recent CT stone study. Unable to receive contrast due to very poor renal function. Abdominal MRI 1/19 confirms minimally complex cysts.  5 - End-Stage Renal Disease - pt with Cr 4's by BMP's past few weeks. Recent CT without hydro or evidence of obstructive uropathy. Now establishing with nephrology.3   Objective: Vital signs in last 24 hours: Temp:  [98.3 F (36.8 C)-100.2 F (37.9 C)] 98.3 F (36.8 C) (01/20 1348) Pulse Rate:  [71-88] 71  (01/20 1348) Resp:  [18] 18  (01/20 1348) BP: (124-134)/(59-65) 124/63 mmHg (01/20 1348) SpO2:  [100 %] 100 % (01/20 1348) Last BM Date: 07/30/12  Intake/Output from previous day: 01/19 0701 - 01/20 0700 In: 2210 [P.O.:840; I.V.:1320; IV Piggyback:50] Out: 3100 [Urine:3100] Intake/Output this shift: Total I/O In: 120 [P.O.:120] Out: 220 [Urine:220]  General appearance: alert, cooperative, appears stated age and wife at bedside Head: Normocephalic, without obvious abnormality, atraumatic Eyes: conjunctivae/corneas clear. PERRL, EOM's intact. Fundi benign. Ears: normal TM's and external ear canals both ears Nose: Nares normal. Septum midline. Mucosa  normal. No drainage or sinus tenderness. Throat: lips, mucosa, and tongue normal; teeth and gums normal Neck: no adenopathy, no carotid bruit, no JVD, supple, symmetrical, trachea midline and thyroid not enlarged, symmetric, no tenderness/mass/nodules Back: symmetric, no curvature. ROM normal. No CVA tenderness. Resp: clear to auscultation bilaterally Chest wall: no tenderness Cardio: regular rate and rhythm, S1, S2 normal, no murmur, click, rub or gallop GI: soft, non-tender; bowel sounds normal; no masses,  no organomegaly Male genitalia: normal, Rt testicle and epidiymis firm and tender. No fluctuence, No drainage. Extremities: extremities normal, atraumatic, no cyanosis or edema Pulses: 2+ and symmetric Skin: Skin color, texture, turgor normal. No rashes or lesions Lymph nodes: Cervical, supraclavicular, and axillary nodes normal. Neurologic: Grossly normal  Lab Results:   Basename 08/02/12 0700 08/02/12 0036  WBC 18.9* 22.5*  HGB 7.6* 8.1*  HCT 22.0* 22.7*  PLT 182 204   BMET  Basename 08/03/12 1050 08/03/12 0617  NA 128* 130*  K 3.7 3.8  CL 92* 93*  CO2 23 23  GLUCOSE 215* 135*  BUN 66* 65*  CREATININE 4.61* 4.77*  CALCIUM 8.8 9.1   PT/INR No results found for this basename: LABPROT:2,INR:2 in the last 72 hours ABG No results found for this basename: PHART:2,PCO2:2,PO2:2,HCO3:2 in the last 72 hours  Studies/Results: Mr Abdomen Wo Contrast  08/03/2012  *RADIOLOGY REPORT*  Clinical Data: Bilateral renal lesions.  MRI ABDOMEN WITHOUT CONTRAST  Technique:  Multiplanar multisequence MR imaging of the abdomen was performed. No intravenous contrast was administered.  Comparison: CT scan 07/21/2012.  Findings: There are innumerable bilateral renal cysts.  All these measure less than 2 cm in size.  Several of  these demonstrate high T1 signal intensity consistent with hemorrhagic cyst.  A lower pole cyst on the right anteriorly appears to have at least 102 septations but other  than map I do not see any worrisome renal lesions that would suggest neoplasm.  No hydronephrosis.  Normal renal cortical thickness.  The liver is grossly normal.  No biliary dilatation.  The gallbladder is normal.  No common bile duct dilatation.  The pancreatic duct is normal.  The pancreas is normal.  The spleen is normal in size.  The adrenal glands are normal.  The aorta is normal in caliber.  No mesenteric or retroperitoneal mass or adenopathy.  The bony structures are grossly normal.  IMPRESSION:  Innumerable small bilateral renal cysts some which are complicated by hemorrhage and septations.  No worrisome MR imaging features to suggest renal neoplasm.  Exam is limited without IV contrast but I think 1-year follow-up MR examination would be adequate to assess stability.   Original Report Authenticated By: Rudie Meyer, M.D.    US Scrotum  08/01/2012  *RADIOLOGY REPORT*  Clinical Data:  67 year old male with right scrotal swelling.  SCROTAL ULTRASOUND DOPPLER ULTRASOUND OF THE TESTICLES  Technique: Complete ultrasound examination of the testicles, epididymis, and other scrotal structures was performed.  Color and spectral Doppler ultrasound were also utilized to evaluate blood flow to the testicles.  Comparison:  None  Findings:  Right testis:  The right testicle is normal in size and echogenicity measuring 4.8 x 2.9 x 3.6 cm.  No focal testicular masses are identified.  Increased vascularity of the right testicle is noted.  Color Doppler flow and arterial/venous waveforms are noted.  Left testis:  The left testicle is normal in size and echogenicity measuring 4 x 2.7 x 2.6 cm.  There is no evidence of solid mass. Normal color Doppler flow and arterial/venous waveforms are noted.  Right epididymis:  The right epididymis is not enlarged and hypervascular compatible with epididymitis.  Left epididymis:  Small spermatoceles/epididymal cysts are noted.  Hydrocele:  A large right hydrocele is noted. A moderate  left hydrocele with internal debris/echoes is noted.  Varicocele:  A left varicocele is present.  Pulsed Doppler interrogation of both testes demonstrates low resistance flow bilaterally.  IMPRESSION: Enlarged right epididymis with increased vascularity in both the right epididymis and right testicle - compatible with right epididymo-orchitis.  Associated large right hydrocele.  Moderate left hydrocele with low level echoes/debris.  No evidence of testicular torsion or focal testicular mass.  Left varicocele.   Original Report Authenticated By: Harmon Pier, M.D.    [place] Art/ven Flow Abd Pelv Doppler  08/01/2012  *RADIOLOGY REPORT*  Clinical Data:  67 year old male with right scrotal swelling.  SCROTAL ULTRASOUND DOPPLER ULTRASOUND OF THE TESTICLES  Technique: Complete ultrasound examination of the testicles, epididymis, and other scrotal structures was performed.  Color and spectral Doppler ultrasound were also utilized to evaluate blood flow to the testicles.  Comparison:  None  Findings:  Right testis:  The right testicle is normal in size and echogenicity measuring 4.8 x 2.9 x 3.6 cm.  No focal testicular masses are identified.  Increased vascularity of the right testicle is noted.  Color Doppler flow and arterial/venous waveforms are noted.  Left testis:  The left testicle is normal in size and echogenicity measuring 4 x 2.7 x 2.6 cm.  There is no evidence of solid mass. Normal color Doppler flow and arterial/venous waveforms are noted.  Right epididymis:  The right epididymis is not enlarged  and hypervascular compatible with epididymitis.  Left epididymis:  Small spermatoceles/epididymal cysts are noted.  Hydrocele:  A large right hydrocele is noted. A moderate left hydrocele with internal debris/echoes is noted.  Varicocele:  A left varicocele is present.  Pulsed Doppler interrogation of both testes demonstrates low resistance flow bilaterally.  IMPRESSION: Enlarged right epididymis with increased vascularity  in both the right epididymis and right testicle - compatible with right epididymo-orchitis.  Associated large right hydrocele.  Moderate left hydrocele with low level echoes/debris.  No evidence of testicular torsion or focal testicular mass.  Left varicocele.   Original Report Authenticated By: Harmon Pier, M.D.     Anti-infectives: Anti-infectives     Start     Dose/Rate Route Frequency Ordered Stop   08/02/12 1000   ciprofloxacin (CIPRO) tablet 250 mg  Status:  Discontinued        250 mg Oral Daily 08/02/12 0011 08/02/12 0059   08/02/12 1000   doxycycline (VIBRAMYCIN) 50 MG capsule 50 mg  Status:  Discontinued        50 mg Oral Every 12 hours 08/02/12 0811 08/02/12 0816   08/02/12 1000   doxycycline (VIBRA-TABS) tablet 50 mg        50 mg Oral Every 12 hours 08/02/12 0816     08/02/12 0900   cefTRIAXone (ROCEPHIN) 1 g in dextrose 5 % 50 mL IVPB        1 g 100 mL/hr over 30 Minutes Intravenous Every 24 hours 08/02/12 0811     08/02/12 0130   ciprofloxacin (CIPRO) IVPB 200 mg  Status:  Discontinued        200 mg 100 mL/hr over 60 Minutes Intravenous Every 12 hours 08/02/12 0059 08/02/12 0811          Assessment/Plan:   1 - Rt Epidiymorchitis - Agree with current regimen of IV ABX, including holding Cipro in light of hypoglycemia. No indications fur surgical therapy at this point.   2 - Elevated PSA - Proceed with biopsy as scheduled and pending 08/2012 at [facility].   3 - Microscopic Hematuria - Likely from prostate.  4 - Bilateral Renal Masses -Consistent with B2F cysts (favorable, no specific intervention warranted)  5 - End-Stage Renal Disease - Now establishing with nephrology.   [facility], Sukhraj Esquivias 08/03/2012

## 2012-08-03 NOTE — Progress Notes (Signed)
[facility]  Subjective:  No acute distress. No nausea, no vomiting. Scrotal pain improving.    Objective: Vital signs in last 24 hours: Blood pressure 131/59, pulse 88, temperature 100.2 F (37.9 C), temperature source Oral, resp. rate 18, height 5\' 11"  (1.803 m), weight 148 lb 1.6 oz (67.178 kg), SpO2 100.00%.    PHYSICAL EXAM Gen: NAD Chest: Clear breath sounds. No rales Cardio: regular rate and rhythm, S1, S2 normal GI: soft, non-tender; bowel sounds normal; no masses, no organomegaly  Extremities: no edema  Neurologic: No focal deficit  Lab Results:   Lab 08/03/12 0014 08/02/12 1747 08/02/12 1358  NA 124* 125* 124*  K 3.5 3.5 3.4*  CL 87* 89* 87*  CO2 24 23 23   BUN 62* 58* 59*  CREATININE 4.65* 4.68* 4.64*  ALB -- -- --  GLUCOSE 115* -- --  CALCIUM 8.7 8.0*9.0 8.7  PHOS -- 3.8 --     Basename 08/02/12 0700 08/02/12 0036  WBC 18.9* 22.5*  HGB 7.6* 8.1*  HCT 22.0* 22.7*  PLT 182 204     Scheduled Meds:   . cefTRIAXone (ROCEPHIN)  IV  1 g Intravenous Q24H  . cloNIDine  0.1 mg Oral BID  . doxycycline  50 mg Oral Q12H  . heparin subcutaneous  5,000 Units Subcutaneous Q8H  . labetalol  200 mg Oral BID  . sodium chloride  3 mL Intravenous Q12H   Continuous Infusions:  PRN Meds:.acetaminophen  Assessment/Plan: Pt is a 67 y.o. yo male with a PMHX of DM2, HTN and CKD4, was admitted to [facility] on 08/01/2012 with hypoglycemia and epididymitis.  1. Chronic kidney disease, stage 4: Creatinine of 4 at urology office in January and 3.2 in 2011. Current creatinine likely from progression of CKD from hypertensive nephrosclerosis.  Recent CT in 07/2012 showed bilateral small renal masses, not fully characterized by CT, but no evidence of obstruction. MR pending.  - Prot/Cr ratio:0.21 - PTH: pending,  phos: normal at 3.8 - hold ARB and HCTZ. Stop metformin in setting of Creatinine >1.5 and risk for acidosis.  - strict I/Os. Daily weights.   2.  hyponatremia: thought to be hypovolemic based on volume status on exam. Receiving NS at 75cc/hr. Sodium not improved.  - UOsm:170. Likely not SIADH. Still possibility of polydipsia. Consider fluid restricting.  - NS at 75c/hr  3. Anemia microcytic with Hg of 10.9 in 2011.  - iron panel showing ferritin <10, without ability to calculate Sat. Ferritin 165.  - consider IV iron  4. Epididymo-orchitis:  - on cephtriaxone and doxy  - followed by urology  5. Hypoglycemia in setting of DM2, possibly induced from lack of clearing of glipizide with current renal function. Improving.  6. Hypertension:  Normotensive on clonidine 0.1mg  bid. All home BP meds held.  DVT PPX - heparin sub cut     LOS: 2 days   LOSQ, STEPHANIE 08/03/2012,7:17 AM  I have seen and examined this patient and agree with plan  Per Dr Gwenlyn Saran.  Denies uremic Sxs.  I think this is simply progression of his underlying renal Ds.  Will start getting him ready to start HD in near future by asking [facility] to see for access and show him the videos.  Will give IV iron and start PO lasix.  DC iv in rt arm. Alainna Stawicki T,MD 08/03/2012 1:03 PM

## 2012-08-04 ENCOUNTER — Encounter (HOSPITAL_COMMUNITY): Payer: Self-pay | Admitting: Pharmacy Technician

## 2012-08-04 LAB — CBC
MCH: 22.7 pg — ABNORMAL LOW (ref 26.0–34.0)
Platelets: 225 10*3/uL (ref 150–400)
RBC: 3.44 MIL/uL — ABNORMAL LOW (ref 4.22–5.81)

## 2012-08-04 LAB — GLUCOSE, CAPILLARY: Glucose-Capillary: 160 mg/dL — ABNORMAL HIGH (ref 70–99)

## 2012-08-04 LAB — BASIC METABOLIC PANEL
CO2: 21 mEq/L (ref 19–32)
Calcium: 9.3 mg/dL (ref 8.4–10.5)
GFR calc non Af Amer: 12 mL/min — ABNORMAL LOW (ref >90)
Potassium: 3.5 mEq/L (ref 3.5–5.1)
Sodium: 132 mEq/L — ABNORMAL LOW (ref 135–145)

## 2012-08-04 MED ORDER — CALCITRIOL 0.5 MCG PO CAPS: 0.5000 ug | ORAL_CAPSULE | Freq: Every day | ORAL | Status: DC

## 2012-08-04 MED ORDER — CALCITRIOL 0.25 MCG PO CAPS: 0.2500 ug | ORAL_CAPSULE | Freq: Every day | ORAL | Status: DC

## 2012-08-04 MED ORDER — CALCITRIOL 0.5 MCG PO CAPS
0.5000 ug | ORAL_CAPSULE | Freq: Every day | ORAL | Status: DC
  Filled 2012-08-04: qty 1

## 2012-08-04 MED ORDER — DARBEPOETIN ALFA-POLYSORBATE 100 MCG/0.5ML IJ SOLN
100.0000 ug | Freq: Once | INTRAMUSCULAR | Status: DC
  Filled 2012-08-04: qty 0.5

## 2012-08-04 MED ORDER — DOXYCYCLINE HYCLATE 100 MG PO TABS: 50.0000 mg | ORAL_TABLET | Freq: Two times a day (BID) | ORAL | Status: DC

## 2012-08-04 MED ORDER — CALCITRIOL 0.25 MCG PO CAPS: 0.2500 ug | ORAL_CAPSULE | Freq: Every day | ORAL | Status: AC

## 2012-08-04 MED ORDER — FERUMOXYTOL INJECTION 510 MG/17 ML
510.0000 mg | Freq: Once | INTRAVENOUS | Status: AC
  Administered 2012-08-04: 510 mg via INTRAVENOUS
  Filled 2012-08-04: qty 17

## 2012-08-04 NOTE — Progress Notes (Signed)
[facility]  Subjective:  No acute distress. No nausea, no vomiting. Scrotal pain improving.    Objective: Vital signs in last 24 hours: Blood pressure 126/63, pulse 77, temperature 99.2 F (37.3 C), temperature source Oral, resp. rate 16, height 5\' 11"  (1.803 m), weight 148 lb 1.6 oz (67.178 kg), SpO2 100.00%.    PHYSICAL EXAM Gen: NAD Chest: Clear breath sounds. No rales Cardio: regular rate and rhythm, S1, S2 normal GI: soft, non-tender; bowel sounds normal; no masses, no organomegaly  Extremities: no edema  Neurologic: No focal deficit  Lab Results:   Lab 08/04/12 0650 08/03/12 1815 08/03/12 1050 08/02/12 1747  NA 132* 130* 128* --  K 3.5 3.9 3.7 --  CL 95* 92* 92* --  CO2 21 24 23  --  BUN 73* 72* 66* --  CREATININE 4.54* 4.76* 4.61* --  ALB -- -- -- --  GLUCOSE 163* -- -- --  CALCIUM 9.3 9.0 8.8 --  PHOS -- -- -- 3.8     Basename 08/04/12 0650 08/02/12 0700  WBC 7.9 18.9*  HGB 7.8* 7.6*  HCT 22.8* 22.0*  PLT 225 182     Scheduled Meds:    . cefTRIAXone (ROCEPHIN)  IV  1 g Intravenous Q24H  . cloNIDine  0.1 mg Oral BID  . doxycycline  50 mg Oral Q12H  . furosemide  40 mg Oral BID  . heparin subcutaneous  5,000 Units Subcutaneous Q8H  . insulin aspart  0-5 Units Subcutaneous QHS  . insulin aspart  0-9 Units Subcutaneous TID WC  . labetalol  200 mg Oral BID  . sodium chloride  3 mL Intravenous Q12H   Continuous Infusions:  PRN Meds:.acetaminophen  Assessment/Plan: Pt is a 67 y.o. yo male with a PMHX of DM2, HTN and CKD4, was admitted to [facility] on 08/01/2012 with hypoglycemia and epididymitis.  1. Chronic kidney disease, stage 4: Creatinine of 4 at urology office in January and 3.2 in 2011. Current creatinine likely from progression of CKD from hypertensive nephrosclerosis.  MR whowing small b/l renal cysts complicated with hemorrhage and septation. No evidence of neoplasm.  - PTH: elevated at 176. Consider calcitriol.  phos: normal at  3.8 - hold ARB and HCTZ. Stop metformin in setting of Creatinine >1.5 and risk for acidosis.  - strict I/Os. Daily weights.  - on lasix 40mg  po bid - vascular has seen patient. Possibility of access later this week  2. Hyponatremia: improving. thought to be hypovolemic based on volume status on exam.  Continue to monitor 3. Anemia microcytic with Hg of 10.9 in 2011.  - iron panel showing ferritin <10, without ability to calculate Sat. Ferritin 165.  - received ferraheme yesterday.   4. Epididymo-orchitis:  - on cephtriaxone and doxy  - followed by urology  5. Hypoglycemia in setting of DM2, possibly induced from lack of clearing of glipizide with current renal function. Improved  6. Hypertension:  Normotensive on clonidine 0.1mg  bid and labetalol 200mg  bid. DVT PPX - heparin sub cut     LOS: 3 days   LOSQ, STEPHANIE 08/04/2012,8:54 AM I have seen and examined this patient and agree with plan per Dr Gwenlyn Saran.  Will start aranesp and calcitriol.  Seen by [facility] for access placement which can be done as outpt.  ? 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 Ishmail Mcmanamon T,MD 08/04/2012 11:17 AM

## 2012-08-04 NOTE — Discharge Summary (Signed)
Physician Discharge Summary  Tim Armstrong ZOX:[id number] DOB: 1945-11-24 DOA: 08/01/2012  PCP: Pcp Not In System  Admit date: 08/01/2012 Discharge date: 08/04/2012  Time spent: 45 minutes  Recommendations for Outpatient Follow-up:  1. Vascular Surgery 2. Nephrology  3. PCP 3-4 days  Discharge Diagnoses:  Principal Problem:  *Hypoglycemia Active Problems:  Diabetes mellitus without complication  Hypertension  Acute renal failure  CKD (chronic kidney disease)  Epididymitis  Epididymo-orchitis, acute  Sepsis  Hyponatremia   Discharge Condition: stable  Diet recommendation: renal/diabetic/heart healthy  Filed Weights   08/02/12 0006  Weight: 67.178 kg (148 lb 1.6 oz)    History of present illness:  67 yo male dx with epididymitis about 3 days ago with swollen and painful rt testicle place on cipro by urology comes in with several days of not eating normally and found to have glucose in the 20s today. His glucose has been dipping low for several days but he is still taking his oral hyperglycemic meds. No fevers but has been having chills. His testicle is less swollen than 3 days ago. Is suppose to take cipro for 28 days or so then f/u with urology for a biopsy. Has been drinking fluid but not eating normally due to his recent illness. No n/v/d. In ED his glucose is dropping in the 50's despite D50 given. Took his dm meds this am but not tonight's doses.  Hospital Course:  1. Sepsis - patient on admission was febrile, had a WBC and an identified source of infection (right epididymo-orchitis). Since he failed outpatient Cipro I have discontinued that and changed his Antibiotics to Ceftriaxone and Doxycycline. His sepsis resolved, his right testicular swelling has improved dramatically in 2 days and on discharge his WBC was down (see below for full labs). He was discharged to complete a 10 day total course of Doxycycline. Urology saw Tim Armstrong while inpatient and will follow up as  an outpatient as well.   2. Hypoglycemia - his hypoglycemia was likely due to #1. Once he was on antibiotics and his infection resolved, he no longer required dextrose containing fluids.   3. Hyponatremia - this was thought to be secondary to hypovolemia in the setting of sepsis and with a component of polydipsia as patient endorses drinking significant amounts of fluids at home. This was corroborated as well by his low urine osmolality. He was placed on fluid restriction HD#2 and his sodium stabilized and on discharge was 132.  4. CKD - Nephrology was consulted while patient was in the hospital and he will establish care as an outpatient soon. Vascular surgery saw patient as well for fistula placement evaluation and has follow up as an outpatient shortly after discharge. Unfortunately he will need hemodialysis in the near future.   5. DM - his Metformin was discontinued given advanced kidney disease. Patient was instructed to follow up with his PCP in 3-4 days following discharge to have his medication revised.  Procedures:  none  MRI abdomen Innumerable small bilateral renal cysts some which are complicated  by hemorrhage and septations. No worrisome MR imaging features to  suggest renal neoplasm. Exam is limited without IV contrast but I  think 1-year follow-up MR examination would be adequate to assess  stability.  Consultations:  Renal  Urology  Vascular Surgery  Discharge Exam: Filed Vitals:   08/03/12 1348 08/03/12 2154 08/04/12 0550 08/04/12 1418  BP: 124/63 117/73 126/63 120/78  Pulse: 71 79 77 68  Temp: 98.3 F (36.8 C) 99.5  F (37.5 C) 99.2 F (37.3 C) 99 F (37.2 C)  TempSrc: Oral  Oral Oral  Resp: 18 19 16 16   Height:      Weight:      SpO2: 100% 100% 100% 99%    General: he is in no apparent distress  Cardiovascular: heart is regular without murmurs  Respiratory: lungs are clear to auscultation  Abdomen: abdomen soft and nontender to palpation  GU:  minimally enlarged right testicle, much improved since yesterday. Mild tenderness to palpation. Left side is normal. Discharge Instructions  Discharge Orders    Future Appointments: Provider: Department: Dept Phone: Center:   08/28/2012 3:00 PM [facility] [phone] [place]       Medication List     As of 08/04/2012  4:11 PM    STOP taking these medications         ciprofloxacin 250 MG tablet   Commonly known as: CIPRO      metFORMIN 500 MG tablet   Commonly known as: GLUCOPHAGE      TAKE these medications         amLODipine 10 MG tablet   Commonly known as: NORVASC   Take 10 mg by mouth daily.      calcitRIOL 0.25 MCG capsule   Commonly known as: ROCALTROL   Take 1 capsule (0.25 mcg total) by mouth daily.      cloNIDine 0.2 MG tablet   Commonly known as: CATAPRES   Take 0.2 mg by mouth 2 (two) times daily.      diltiazem 240 MG 24 hr capsule   Commonly known as: TIAZAC   Take 240 mg by mouth daily.      doxycycline 100 MG tablet   Commonly known as: VIBRA-TABS   Take 0.5 tablets (50 mg total) by mouth every 12 (twelve) hours.      glipiZIDE 10 MG tablet   Commonly known as: GLUCOTROL   Take 10 mg by mouth 2 (two) times daily before a meal.      hydrALAZINE 25 MG tablet   Commonly known as: APRESOLINE   Take 25 mg by mouth 4 (four) times daily.      labetalol 200 MG tablet   Commonly known as: NORMODYNE   Take 200 mg by mouth 2 (two) times daily.      losartan-hydrochlorothiazide 100-25 MG per tablet   Commonly known as: HYZAAR   Take 1 tablet by mouth daily.      potassium chloride 10 MEQ tablet   Commonly known as: K-DUR   Take 10 mEq by mouth 2 (two) times daily.           Follow-up Information    Follow up with [facility], MD. In 1 week. (08/12/12 at 3pm. Please come at 2:30pm. )    Contact information:   [address] [facility] [place] [address] [phone]       Follow up  with DICKSON,CHRISTOPHER S, MD. In 1 week.   Contact information:   [address] [place] [address] [phone]           The results of significant diagnostics from this hospitalization (including imaging, microbiology, ancillary and laboratory) are listed below for reference.    Significant Diagnostic Studies: Mr Abdomen Wo Contrast  08-05-2012  *RADIOLOGY REPORT*  Clinical Data: Bilateral renal lesions.  MRI ABDOMEN WITHOUT CONTRAST  Technique:  Multiplanar multisequence MR imaging of the abdomen was performed. No intravenous contrast was  administered.  Comparison: CT scan 07/21/2012.  Findings: There are innumerable bilateral renal cysts.  All these measure less than 2 cm in size.  Several of these demonstrate high T1 signal intensity consistent with hemorrhagic cyst.  A lower pole cyst on the right anteriorly appears to have at least 102 septations but other than map I do not see any worrisome renal lesions that would suggest neoplasm.  No hydronephrosis.  Normal renal cortical thickness.  The liver is grossly normal.  No biliary dilatation.  The gallbladder is normal.  No common bile duct dilatation.  The pancreatic duct is normal.  The pancreas is normal.  The spleen is normal in size.  The adrenal glands are normal.  The aorta is normal in caliber.  No mesenteric or retroperitoneal mass or adenopathy.  The bony structures are grossly normal.  IMPRESSION:  Innumerable small bilateral renal cysts some which are complicated by hemorrhage and septations.  No worrisome MR imaging features to suggest renal neoplasm.  Exam is limited without IV contrast but I think 1-year follow-up MR examination would be adequate to assess stability.   Original Report Authenticated By: Rudie Meyer, M.D.    US Scrotum  08/01/2012  *RADIOLOGY REPORT*  Clinical Data:  67 year old male with right scrotal swelling.  SCROTAL ULTRASOUND DOPPLER ULTRASOUND OF THE TESTICLES  Technique: Complete ultrasound examination  of the testicles, epididymis, and other scrotal structures was performed.  Color and spectral Doppler ultrasound were also utilized to evaluate blood flow to the testicles.  Comparison:  None  Findings:  Right testis:  The right testicle is normal in size and echogenicity measuring 4.8 x 2.9 x 3.6 cm.  No focal testicular masses are identified.  Increased vascularity of the right testicle is noted.  Color Doppler flow and arterial/venous waveforms are noted.  Left testis:  The left testicle is normal in size and echogenicity measuring 4 x 2.7 x 2.6 cm.  There is no evidence of solid mass. Normal color Doppler flow and arterial/venous waveforms are noted.  Right epididymis:  The right epididymis is not enlarged and hypervascular compatible with epididymitis.  Left epididymis:  Small spermatoceles/epididymal cysts are noted.  Hydrocele:  A large right hydrocele is noted. A moderate left hydrocele with internal debris/echoes is noted.  Varicocele:  A left varicocele is present.  Pulsed Doppler interrogation of both testes demonstrates low resistance flow bilaterally.  IMPRESSION: Enlarged right epididymis with increased vascularity in both the right epididymis and right testicle - compatible with right epididymo-orchitis.  Associated large right hydrocele.  Moderate left hydrocele with low level echoes/debris.  No evidence of testicular torsion or focal testicular mass.  Left varicocele.   Original Report Authenticated By: Harmon Pier, M.D.    [place] Art/ven Flow Abd Pelv Doppler  08/01/2012  *RADIOLOGY REPORT*  Clinical Data:  67 year old male with right scrotal swelling.  SCROTAL ULTRASOUND DOPPLER ULTRASOUND OF THE TESTICLES  Technique: Complete ultrasound examination of the testicles, epididymis, and other scrotal structures was performed.  Color and spectral Doppler ultrasound were also utilized to evaluate blood flow to the testicles.  Comparison:  None  Findings:  Right testis:  The right testicle is normal in  size and echogenicity measuring 4.8 x 2.9 x 3.6 cm.  No focal testicular masses are identified.  Increased vascularity of the right testicle is noted.  Color Doppler flow and arterial/venous waveforms are noted.  Left testis:  The left testicle is normal in size and echogenicity measuring 4 x 2.7 x 2.6 cm.  There is no evidence of solid mass. Normal color Doppler flow and arterial/venous waveforms are noted.  Right epididymis:  The right epididymis is not enlarged and hypervascular compatible with epididymitis.  Left epididymis:  Small spermatoceles/epididymal cysts are noted.  Hydrocele:  A large right hydrocele is noted. A moderate left hydrocele with internal debris/echoes is noted.  Varicocele:  A left varicocele is present.  Pulsed Doppler interrogation of both testes demonstrates low resistance flow bilaterally.  IMPRESSION: Enlarged right epididymis with increased vascularity in both the right epididymis and right testicle - compatible with right epididymo-orchitis.  Associated large right hydrocele.  Moderate left hydrocele with low level echoes/debris.  No evidence of testicular torsion or focal testicular mass.  Left varicocele.   Original Report Authenticated By: Harmon Pier, M.D.     Microbiology: Recent Results (from the past 240 hour(s))  URINE CULTURE     Status: Normal   Collection Time   08/01/12  6:33 PM      Component Value Range Status Comment   Specimen Description URINE, CLEAN CATCH   Final    Special Requests NONE   Final    Culture  Setup Time 08/02/2012 02:55   Final    Colony Count NO GROWTH   Final    Culture NO GROWTH   Final    Report Status 08/03/2012 FINAL   Final   GRAM STAIN     Status: Normal   Collection Time   08/01/12  6:33 PM      Component Value Range Status Comment   Specimen Description URINE, CLEAN CATCH   Final    Special Requests NONE   Final    Gram Stain     Final    Value: CYTOSPIN SLIDE     WBC PRESENT,BOTH PMN AND MONONUCLEAR     NEGATIVE FOR  BACTERIA   Report Status 08/01/2012 FINAL   Final   CULTURE, BLOOD (ROUTINE X 2)     Status: Normal (Preliminary result)   Collection Time   08/02/12  8:35 AM      Component Value Range Status Comment   Specimen Description BLOOD ARM RIGHT   Final    Special Requests BOTTLES DRAWN AEROBIC AND ANAEROBIC 10CC EACH   Final    Culture  Setup Time 08/02/2012 18:56   Final    Culture     Final    Value:        BLOOD CULTURE RECEIVED NO GROWTH TO DATE CULTURE WILL BE HELD FOR 5 DAYS BEFORE ISSUING A FINAL NEGATIVE REPORT   Report Status PENDING   Incomplete   CULTURE, BLOOD (ROUTINE X 2)     Status: Normal (Preliminary result)   Collection Time   08/02/12  8:40 AM      Component Value Range Status Comment   Specimen Description BLOOD HAND RIGHT   Final    Special Requests BOTTLES DRAWN AEROBIC AND ANAEROBIC 10CC EACH   Final    Culture  Setup Time 08/02/2012 18:57   Final    Culture     Final    Value:        BLOOD CULTURE RECEIVED NO GROWTH TO DATE CULTURE WILL BE HELD FOR 5 DAYS BEFORE ISSUING A FINAL NEGATIVE REPORT   Report Status PENDING   Incomplete      Labs: Basic Metabolic Panel: Lab 08/04/12 0650 08/03/12 1815 08/03/12 1050 08/03/12 0617 08/03/12 0014 08/02/12 1747  NA 132* 130* 128* 130* 124* --  K 3.5 3.9  3.7 3.8 3.5 --  CL 95* 92* 92* 93* 87* --  CO2 21 24 23 23 24  --  GLUCOSE 163* 288* 215* 135* 115* --  BUN 73* 72* 66* 65* 62* --  CREATININE 4.54* 4.76* 4.61* 4.77* 4.65* --  CALCIUM 9.3 9.0 8.8 9.1 8.7 --  MG -- -- -- -- -- --  PHOS -- -- -- -- -- 3.8   CBC:  Lab 08/04/12 0650 08/02/12 0700 08/02/12 0036 08/01/12 1743  WBC 7.9 18.9* 22.5* 23.8*  NEUTROABS -- -- -- 22.2*  HGB 7.8* 7.6* 8.1* 8.8*  HCT 22.8* 22.0* 22.7* 25.7*  MCV 66.3* 65.9* 66.2* 66.9*  PLT 225 182 204 203   CBG:  Lab 08/04/12 1215 08/04/12 0750 08/03/12 2333 08/03/12 2159 08/03/12 1926  GLUCAP 243* 160* 145* 145* 205*   Signed:  GHERGHE, COSTIN  [facility] 08/04/2012, 4:11 PM

## 2012-08-04 NOTE — Progress Notes (Addendum)
VVS Progress Note:  CISCO KINDT is a 67 y.o. male with ESRD and admitted with epididymitis, on ABX. Vein mapping reviewed and veins in RUE small. LUE shows adequate forearm cephalic with small upper arm cephalic and Basilic veins. Pt may need graft vs AVF in LUE.  Filed Vitals:   08/02/12 2101 08/03/12 1348 08/03/12 2154 08/04/12 0550  BP: 131/59 124/63 117/73 126/63  Pulse: 88 71 79 77  Temp:  98.3 F (36.8 C) 99.5 F (37.5 C) 99.2 F (37.3 C)  TempSrc: Oral Oral  Oral  Resp: 18 18 19 16   Height:      Weight:      SpO2: 100% 100% 100% 100%   If pt needs graft placement we may want to wait until infectious process cleared. Will Discuss with MD - Dr Bosie Helper note prior to VM -He does appear to be a candidate for a left Cimino AV fistula  Which could be done as an inpatient or outpatient. He cannot be placed on the elective schedule until later in the week. We will follow and plan AV fistula later in the week if it inpatient or outpatient   Dr Early reviewed Vein Mapping - will Have Dr. Edilia Bo do a left arm AVF on Thursday

## 2012-08-04 NOTE — Progress Notes (Signed)
[facility] PROGRESS NOTE  Tim Armstrong ZOX:[id number] DOB: 1946-05-02 DOA: 08/01/2012 PCP: Pcp Not In System  Assessment/Plan: 1. Sepsis - 2/2 Epididymo-orchitis, with fever, leukocytosis, and the right testicular infectious source. Patient is not tachycardic however he is on calcium channel blockers. Empirically started ceftriaxone and doxycycline. Patient is not hypotensive at this point. We have discontinued all his antihypertensive medications, however I am continuing lower dose clonidine. - will stop the ceftriaxone on d/c and continue oral doxycycline for 10 more days.  - resolved, doing well.   2. Hypoglycemia - it's possible that this is related his infection. I do not suspect exogenous insulin use. He does not have a prescription for insulin, and le doesn't live with anyone diabetic and on insulin..  - resolved  2. Hyponatremia - he has stabilized between 128 and 130. He is now on 1500 mL fluid restriction per day - stabilized  3. acute renal failure, possibly on chronic kidney disease  - will give information to the patient about nephrology follow up and vascular surgery  4. Right Epididymo-orchitis  - greatly improved today - will stop the ceftriaxone on d/c and continue oral doxycycline for 10 more days.  Code Status: Full Family Communication: none (indicate person spoken with, relationship, and if by phone, the number) Disposition Plan: d/c home today  Consultants:  Renal  Urology  Procedures:  none  Antibiotics:  Ceftriaxone 1/19 >>  Doxycyline 1/19 >>  HPI/Subjective: No complaints this morning, appreciates his infection to improve.   Objective: Filed Vitals:   08/02/12 2101 08/03/12 1348 08/03/12 2154 08/04/12 0550  BP: 131/59 124/63 117/73 126/63  Pulse: 88 71 79 77  Temp:  98.3 F (36.8 C) 99.5 F (37.5 C) 99.2 F (37.3 C)  TempSrc: Oral Oral  Oral  Resp: 18 18 19 16   Height:      Weight:      SpO2: 100% 100% 100% 100%     Intake/Output Summary (Last 24 hours) at 08/04/12 1207 Last data filed at 08/04/12 8119  Gross per 24 hour  Intake    480 ml  Output   1870 ml  Net  -1390 ml   Filed Weights   08/02/12 0006  Weight: 67.178 kg (148 lb 1.6 oz)    Exam:   General:   he is in no apparent distress   Cardiovascular:  heart is regular without murmurs  Respiratory:  lungs are clear to auscultation   Abdomen: abdomen soft and nontender to palpation    GU:  minimally Enlarged right testicle, much improved since yesterday. Mild tenderness to palpation. Left side is normal.  Data Reviewed: Basic Metabolic Panel:  Lab 08/04/12 1478 08/03/12 1815 08/03/12 1050 08/03/12 0617 08/03/12 0014 08/02/12 1747  NA 132* 130* 128* 130* 124* --  K 3.5 3.9 3.7 3.8 3.5 --  CL 95* 92* 92* 93* 87* --  CO2 21 24 23 23 24  --  GLUCOSE 163* 288* 215* 135* 115* --  BUN 73* 72* 66* 65* 62* --  CREATININE 4.54* 4.76* 4.61* 4.77* 4.65* --  CALCIUM 9.3 9.0 8.8 9.1 8.7 --  MG -- -- -- -- -- --  PHOS -- -- -- -- -- 3.8   CBC:  Lab 08/04/12 0650 08/02/12 0700 08/02/12 0036 08/01/12 1743  WBC 7.9 18.9* 22.5* 23.8*  NEUTROABS -- -- -- 22.2*  HGB 7.8* 7.6* 8.1* 8.8*  HCT 22.8* 22.0* 22.7* 25.7*  MCV 66.3* 65.9* 66.2* 66.9*  PLT 225 182 204 203  CBG:  Lab 08/04/12 0750 Sep 02, 2012 2333 2012-09-02 2159 09/02/12 1926 Sep 02, 2012 1817  GLUCAP 160* 145* 145* 205* 277*    Recent Results (from the past 240 hour(s))  URINE CULTURE     Status: Normal   Collection Time   08/01/12  6:33 PM      Component Value Range Status Comment   Specimen Description URINE, CLEAN CATCH   Final    Special Requests NONE   Final    Culture  Setup Time 08/02/2012 02:55   Final    Colony Count NO GROWTH   Final    Culture NO GROWTH   Final    Report Status September 02, 2012 FINAL   Final   GRAM STAIN     Status: Normal   Collection Time   08/01/12  6:33 PM      Component Value Range Status Comment   Specimen Description URINE, CLEAN CATCH   Final     Special Requests NONE   Final    Gram Stain     Final    Value: CYTOSPIN SLIDE     WBC PRESENT,BOTH PMN AND MONONUCLEAR     NEGATIVE FOR BACTERIA   Report Status 08/01/2012 FINAL   Final   CULTURE, BLOOD (ROUTINE X 2)     Status: Normal (Preliminary result)   Collection Time   08/02/12  8:35 AM      Component Value Range Status Comment   Specimen Description BLOOD ARM RIGHT   Final    Special Requests BOTTLES DRAWN AEROBIC AND ANAEROBIC 10CC EACH   Final    Culture  Setup Time 08/02/2012 18:56   Final    Culture     Final    Value:        BLOOD CULTURE RECEIVED NO GROWTH TO DATE CULTURE WILL BE HELD FOR 5 DAYS BEFORE ISSUING A FINAL NEGATIVE REPORT   Report Status PENDING   Incomplete   CULTURE, BLOOD (ROUTINE X 2)     Status: Normal (Preliminary result)   Collection Time   08/02/12  8:40 AM      Component Value Range Status Comment   Specimen Description BLOOD HAND RIGHT   Final    Special Requests BOTTLES DRAWN AEROBIC AND ANAEROBIC 10CC EACH   Final    Culture  Setup Time 08/02/2012 18:57   Final    Culture     Final    Value:        BLOOD CULTURE RECEIVED NO GROWTH TO DATE CULTURE WILL BE HELD FOR 5 DAYS BEFORE ISSUING A FINAL NEGATIVE REPORT   Report Status PENDING   Incomplete      Studies: Mr Abdomen Wo Contrast  02-Sep-2012  *RADIOLOGY REPORT*  Clinical Data: Bilateral renal lesions.  MRI ABDOMEN WITHOUT CONTRAST  Technique:  Multiplanar multisequence MR imaging of the abdomen was performed. No intravenous contrast was administered.  Comparison: CT scan 07/21/2012.  Findings: There are innumerable bilateral renal cysts.  All these measure less than 2 cm in size.  Several of these demonstrate high T1 signal intensity consistent with hemorrhagic cyst.  A lower pole cyst on the right anteriorly appears to have at least 102 septations but other than map I do not see any worrisome renal lesions that would suggest neoplasm.  No hydronephrosis.  Normal renal cortical thickness.  The  liver is grossly normal.  No biliary dilatation.  The gallbladder is normal.  No common bile duct dilatation.  The pancreatic duct is normal.  The pancreas  is normal.  The spleen is normal in size.  The adrenal glands are normal.  The aorta is normal in caliber.  No mesenteric or retroperitoneal mass or adenopathy.  The bony structures are grossly normal.  IMPRESSION:  Innumerable small bilateral renal cysts some which are complicated by hemorrhage and septations.  No worrisome MR imaging features to suggest renal neoplasm.  Exam is limited without IV contrast but I think 1-year follow-up MR examination would be adequate to assess stability.   Original Report Authenticated By: Rudie Meyer, M.D.     Scheduled Meds:    . calcitRIOL  0.25 mcg Oral Daily  . cefTRIAXone (ROCEPHIN)  IV  1 g Intravenous Q24H  . cloNIDine  0.1 mg Oral BID  . darbepoetin (ARANESP) injection - NON-DIALYSIS  100 mcg Subcutaneous Once  . doxycycline  50 mg Oral Q12H  . ferumoxytol  510 mg Intravenous Once  . furosemide  40 mg Oral BID  . heparin subcutaneous  5,000 Units Subcutaneous Q8H  . insulin aspart  0-5 Units Subcutaneous QHS  . insulin aspart  0-9 Units Subcutaneous TID WC  . labetalol  200 mg Oral BID  . sodium chloride  3 mL Intravenous Q12H   Continuous Infusions:   Principal Problem:  *Hypoglycemia Active Problems:  Diabetes mellitus without complication  Hypertension  Acute renal failure  CKD (chronic kidney disease)  Epididymitis  Epididymo-orchitis, acute  Sepsis  Hyponatremia   Pamella Pert  [facility] Pager [phone]. If 7PM-7AM, please contact night-coverage at [email or web address], password [facility] 08/04/2012, 12:07 PM  LOS: 3 days

## 2012-08-05 ENCOUNTER — Other Ambulatory Visit: Payer: Self-pay | Admitting: *Deleted

## 2012-08-06 ENCOUNTER — Encounter (HOSPITAL_COMMUNITY): Payer: Self-pay | Admitting: Anesthesiology

## 2012-08-06 ENCOUNTER — Encounter (HOSPITAL_COMMUNITY)
Admission: RE | Disposition: A | Discharge status: Home / Self Care | Payer: Self-pay | Source: Ambulatory Visit | Attending: Vascular Surgery

## 2012-08-06 ENCOUNTER — Inpatient Hospital Stay (HOSPITAL_COMMUNITY): Payer: Medicare Other

## 2012-08-06 ENCOUNTER — Telehealth: Payer: Self-pay | Admitting: Vascular Surgery

## 2012-08-06 ENCOUNTER — Ambulatory Visit (HOSPITAL_COMMUNITY)
Admission: RE | Admit: 2012-08-06 | Discharge: 2012-08-06 | Disposition: A | Discharge status: Home / Self Care | Payer: Medicare Other | Source: Ambulatory Visit | Attending: Vascular Surgery | Admitting: Vascular Surgery

## 2012-08-06 ENCOUNTER — Inpatient Hospital Stay (HOSPITAL_COMMUNITY): Payer: Medicare Other | Admitting: Anesthesiology

## 2012-08-06 ENCOUNTER — Other Ambulatory Visit: Payer: Self-pay | Admitting: *Deleted

## 2012-08-06 ENCOUNTER — Encounter (HOSPITAL_COMMUNITY): Payer: Self-pay | Admitting: *Deleted

## 2012-08-06 DIAGNOSIS — N186 End stage renal disease: Secondary | ICD-10-CM

## 2012-08-06 DIAGNOSIS — I808 Phlebitis and thrombophlebitis of other sites: Secondary | ICD-10-CM | POA: Insufficient documentation

## 2012-08-06 DIAGNOSIS — Z5309 Procedure and treatment not carried out because of other contraindication: Secondary | ICD-10-CM | POA: Insufficient documentation

## 2012-08-06 LAB — POCT I-STAT 4, (NA,K, GLUC, HGB,HCT)
HCT: 27 % — ABNORMAL LOW (ref 39.0–52.0)
Hemoglobin: 9.2 g/dL — ABNORMAL LOW (ref 13.0–17.0)
Sodium: 137 mEq/L (ref 135–145)

## 2012-08-06 SURGERY — CANCELLED PROCEDURE
Anesthesia: Monitor Anesthesia Care | Site: Arm Lower | Laterality: Left

## 2012-08-06 MED ORDER — SODIUM CHLORIDE 0.9 % IV SOLN
INTRAVENOUS | Status: DC
  Administered 2012-08-06: 09:00:00 via INTRAVENOUS

## 2012-08-06 MED ORDER — MUPIROCIN 2 % EX OINT
TOPICAL_OINTMENT | Freq: Once | CUTANEOUS | Status: DC
Start: 2012-08-06 — End: 2012-08-06

## 2012-08-06 MED ORDER — DEXTROSE 5 % IV SOLN: 1.5000 g | INTRAVENOUS | Status: DC

## 2012-08-06 MED ORDER — MUPIROCIN 2 % EX OINT
TOPICAL_OINTMENT | CUTANEOUS | Status: AC
  Filled 2012-08-06: qty 22

## 2012-08-06 MED ORDER — LABETALOL HCL 200 MG PO TABS
200.0000 mg | ORAL_TABLET | ORAL | Status: AC
  Administered 2012-08-06: 200 mg via ORAL
  Filled 2012-08-06: qty 1

## 2012-08-06 SURGICAL SUPPLY — 31 items
CANISTER SUCTION 2500CC (MISCELLANEOUS) ×2 IMPLANT
CLIP TI MEDIUM 6 (CLIP) ×2 IMPLANT
CLIP TI WIDE RED SMALL 6 (CLIP) ×2 IMPLANT
CLOTH BEACON ORANGE TIMEOUT ST (SAFETY) ×2 IMPLANT
COVER PROBE W GEL 5X96 (DRAPES) ×2 IMPLANT
COVER SURGICAL LIGHT HANDLE (MISCELLANEOUS) ×2 IMPLANT
DECANTER SPIKE VIAL GLASS SM (MISCELLANEOUS) ×2 IMPLANT
DERMABOND ADVANCED (GAUZE/BANDAGES/DRESSINGS) ×1
DERMABOND ADVANCED .7 DNX12 (GAUZE/BANDAGES/DRESSINGS) ×1 IMPLANT
DRAIN PENROSE 1/2X12 LTX STRL (WOUND CARE) IMPLANT
ELECT REM PT RETURN 9FT ADLT (ELECTROSURGICAL) ×2
ELECTRODE REM PT RTRN 9FT ADLT (ELECTROSURGICAL) ×1 IMPLANT
GLOVE BIO SURGEON STRL SZ7.5 (GLOVE) ×2 IMPLANT
GLOVE BIOGEL PI IND STRL 8 (GLOVE) ×1 IMPLANT
GLOVE BIOGEL PI INDICATOR 8 (GLOVE) ×1
GOWN STRL NON-REIN LRG LVL3 (GOWN DISPOSABLE) ×4 IMPLANT
KIT BASIN OR (CUSTOM PROCEDURE TRAY) ×2 IMPLANT
KIT ROOM TURNOVER OR (KITS) ×2 IMPLANT
NS IRRIG 1000ML POUR BTL (IV SOLUTION) ×2 IMPLANT
PACK CV ACCESS (CUSTOM PROCEDURE TRAY) ×2 IMPLANT
PAD ARMBOARD 7.5X6 YLW CONV (MISCELLANEOUS) ×4 IMPLANT
SPONGE GAUZE 4X4 12PLY (GAUZE/BANDAGES/DRESSINGS) ×2 IMPLANT
SPONGE SURGIFOAM ABS GEL 100 (HEMOSTASIS) IMPLANT
SUT PROLENE 6 0 BV (SUTURE) ×2 IMPLANT
SUT VIC AB 3-0 SH 27 (SUTURE) ×1
SUT VIC AB 3-0 SH 27X BRD (SUTURE) ×1 IMPLANT
SUT VICRYL 4-0 PS2 18IN ABS (SUTURE) ×2 IMPLANT
TOWEL OR 17X24 6PK STRL BLUE (TOWEL DISPOSABLE) ×2 IMPLANT
TOWEL OR 17X26 10 PK STRL BLUE (TOWEL DISPOSABLE) ×2 IMPLANT
UNDERPAD 30X30 INCONTINENT (UNDERPADS AND DIAPERS) ×2 IMPLANT
WATER STERILE IRR 1000ML POUR (IV SOLUTION) ×2 IMPLANT

## 2012-08-06 NOTE — Progress Notes (Signed)
PROGRESS NOTE: This patient had been seen in consultation in the hospital by Dr. Arbie Cookey and arrangements made for me to place a left forearm radiocephalic fistula on as an outpatient. During his hospitalization he had an IV in the left forearm. This had been removed. He presented today for surgery and was complaining of pain in the left forearm and was noted to have an area of phlebitis in the cephalic vein in the forearm. I evaluated this with the ultrasound scanner and there was clearly clot in the cephalic vein. He had no other good vein is identified for a fistula on his previous ultrasound study. He was not a good candidate for graft given his recent epididymitis and possible infection. This reason his surgery was canceled and he'll be seen back in the office in 2-3 weeks and be reevaluated for access. Cari Caraway Beeper [phone] 08/06/2012

## 2012-08-06 NOTE — H&P (View-Only) (Signed)
VVS Progress Note:  Tim Armstrong is a 67 y.o. male with ESRD and admitted with epididymitis, on ABX. Vein mapping reviewed and veins in RUE small. LUE shows adequate forearm cephalic with small upper arm cephalic and Basilic veins. Pt may need graft vs AVF in LUE.  Filed Vitals:   08/02/12 2101 08/03/12 1348 08/03/12 2154 08/04/12 0550  BP: 131/59 124/63 117/73 126/63  Pulse: 88 71 79 77  Temp:  98.3 F (36.8 C) 99.5 F (37.5 C) 99.2 F (37.3 C)  TempSrc: Oral Oral  Oral  Resp: 18 18 19 16  Height:      Weight:      SpO2: 100% 100% 100% 100%   If pt needs graft placement we may want to wait until infectious process cleared. Will Discuss with MD - Dr Early's note prior to VM -He does appear to be a candidate for a left Cimino AV fistula  Which could be done as an inpatient or outpatient. He cannot be placed on the elective schedule until later in the week. We will follow and plan AV fistula later in the week if it inpatient or outpatient   Dr Early reviewed Vein Mapping - will Have Dr. Dickson do a left arm AVF on Thursday 

## 2012-08-06 NOTE — Telephone Encounter (Addendum)
Message copied by Rosalyn Charters on Thu Aug 06, 2012 11:42 AM ------      Message from: Melene Plan      Created: Thu Aug 06, 2012 10:13 AM      Regarding: FW: cancellation and f/u                   ----- Message -----         From: Chuck Hint, MD         Sent: 08/06/2012  10:00 AM           To: Reuel Derby, Melene Plan, RN      Subject: cancellation and f/u                                     This patient had been scheduled for me today to do a left Cimino fistula on. However, the patient had an IV in the left arm and when I evaluated the patient they had phlebitis of the left forearm with clot in the cephalic vein. The procedure was canceled. He needs to be seen in 2-3 weeks to be reevaluated for new access. If possible we should reevaluate his right arm with a vein mapping although he did have an in the hospital. If this is not possible I can look myself with the [employer].      Thanks CSD  unable to reach patient by phone (home # disconnected) mailed appt. letter notifying pt. of fu appt. on 08-26-12- at 3:00 with dr. Edilia Bo

## 2012-08-06 NOTE — Progress Notes (Signed)
Case canceled.  Pt aware and understanding of instructions as given by Dr Edilia Bo.  Short stay notified and pt to be brought up to post op to change clothes.

## 2012-08-06 NOTE — Anesthesia Preprocedure Evaluation (Deleted)
Anesthesia Evaluation  Patient identified by MRN, date of birth, ID band Patient awake    Reviewed: Allergy & Precautions, H&P , NPO status , Patient's Chart, lab work & pertinent test results, reviewed documented beta blocker date and time   History of Anesthesia Complications Negative for: history of anesthetic complications  Airway Mallampati: II TM Distance: >3 FB Neck ROM: Full    Dental  (+) Missing, Chipped and Dental Advisory Given   Pulmonary former smoker,  breath sounds clear to auscultation  Pulmonary exam normal       Cardiovascular hypertension, Pt. on medications and Pt. on home beta blockers Rhythm:Regular Rate:Normal     Neuro/Psych negative neurological ROS     GI/Hepatic GERD-  Controlled,  Endo/Other  diabetes (glu 200), Poorly Controlled, Type 2, Oral Hypoglycemic Agents  Renal/GU ESRFRenal disease (no dialysis yet, K+ 4.3 today)     Musculoskeletal   Abdominal   Peds  Hematology   Anesthesia Other Findings   Reproductive/Obstetrics                           Anesthesia Physical Anesthesia Plan  ASA: III  Anesthesia Plan: MAC   Post-op Pain Management:    Induction: Intravenous  Airway Management Planned: Natural Airway and Simple Face Mask  Additional Equipment:   Intra-op Plan:   Post-operative Plan:   Informed Consent: I have reviewed the patients History and Physical, chart, labs and discussed the procedure including the risks, benefits and alternatives for the proposed anesthesia with the patient or authorized representative who has indicated his/her understanding and acceptance.   Dental advisory given  Plan Discussed with: CRNA and Surgeon  Anesthesia Plan Comments: (Plan routine monitors, MAC)        Anesthesia Quick Evaluation

## 2012-08-06 NOTE — Interval H&P Note (Signed)
History and Physical Interval Note:  08/06/2012 9:26 AM  Tim Armstrong  has presented today for surgery, with the diagnosis of ESRD  The various methods of treatment have been discussed with the patient and family. After consideration of risks, benefits and other options for treatment, the patient has consented to  Procedure(s) (LRB) with comments: ARTERIOVENOUS (AV) FISTULA CREATION (Left) - CIMINO as a surgical intervention .  The patient's history has been reviewed, patient examined, no change in status, stable for surgery.  I have reviewed the patient's chart and labs.  Questions were answered to the patient's satisfaction.     [name]

## 2012-08-07 MED FILL — Mupirocin Oint 2%: CUTANEOUS | Qty: 22 | Status: AC

## 2012-08-08 LAB — CULTURE, BLOOD (ROUTINE X 2): Culture: NO GROWTH

## 2012-08-25 ENCOUNTER — Encounter: Payer: Self-pay | Admitting: Vascular Surgery

## 2012-08-26 ENCOUNTER — Ambulatory Visit: Payer: Medicare Other | Admitting: Vascular Surgery

## 2012-08-28 ENCOUNTER — Ambulatory Visit (HOSPITAL_COMMUNITY): Payer: Medicare Other

## 2012-10-02 ENCOUNTER — Other Ambulatory Visit (HOSPITAL_COMMUNITY): Payer: Self-pay | Admitting: *Deleted

## 2012-10-05 ENCOUNTER — Encounter (HOSPITAL_COMMUNITY)
Admission: RE | Admit: 2012-10-05 | Discharge: 2012-10-05 | Disposition: A | Discharge status: Home / Self Care | Payer: Medicare Other | Source: Ambulatory Visit | Attending: Nephrology | Admitting: Nephrology

## 2012-10-05 VITALS — BP 123/75 | HR 65 | Temp 98.0°F | Resp 16

## 2012-10-05 DIAGNOSIS — N189 Chronic kidney disease, unspecified: Secondary | ICD-10-CM

## 2012-10-05 DIAGNOSIS — N186 End stage renal disease: Secondary | ICD-10-CM | POA: Insufficient documentation

## 2012-10-05 MED ORDER — DARBEPOETIN ALFA-POLYSORBATE 200 MCG/0.4ML IJ SOLN
INTRAMUSCULAR | Status: AC
  Filled 2012-10-05: qty 0.4

## 2012-10-05 MED ORDER — DARBEPOETIN ALFA-POLYSORBATE 200 MCG/0.4ML IJ SOLN
200.0000 ug | INTRAMUSCULAR | Status: DC
  Administered 2012-10-05: 200 ug via SUBCUTANEOUS

## 2012-10-06 LAB — PTH, INTACT AND CALCIUM
Calcium, Total (PTH): 9.8 mg/dL (ref 8.4–10.5)
PTH: 69.5 pg/mL (ref 14.0–72.0)

## 2012-10-19 ENCOUNTER — Encounter (HOSPITAL_COMMUNITY)
Admission: RE | Admit: 2012-10-19 | Discharge: 2012-10-19 | Disposition: A | Discharge status: Home / Self Care | Payer: Medicare Other | Source: Ambulatory Visit | Attending: Nephrology | Admitting: Nephrology

## 2012-10-19 VITALS — BP 115/68 | HR 60 | Temp 97.6°F | Resp 18

## 2012-10-19 DIAGNOSIS — N189 Chronic kidney disease, unspecified: Secondary | ICD-10-CM | POA: Insufficient documentation

## 2012-10-19 MED ORDER — DARBEPOETIN ALFA-POLYSORBATE 200 MCG/0.4ML IJ SOLN
200.0000 ug | INTRAMUSCULAR | Status: DC
  Administered 2012-10-19: 200 ug via SUBCUTANEOUS

## 2012-10-19 MED ORDER — DARBEPOETIN ALFA-POLYSORBATE 60 MCG/0.3ML IJ SOLN
INTRAMUSCULAR | Status: AC
  Filled 2012-10-19: qty 0.3

## 2012-10-20 ENCOUNTER — Other Ambulatory Visit: Payer: Self-pay | Admitting: Urology

## 2012-10-20 LAB — PTH, INTACT AND CALCIUM: Calcium, Total (PTH): 9.6 mg/dL (ref 8.4–10.5)

## 2012-11-02 ENCOUNTER — Encounter (HOSPITAL_COMMUNITY)
Admission: RE | Admit: 2012-11-02 | Discharge: 2012-11-02 | Disposition: A | Discharge status: Home / Self Care | Payer: Medicare Other | Source: Ambulatory Visit | Attending: Nephrology | Admitting: Nephrology

## 2012-11-02 VITALS — BP 134/64 | HR 55 | Temp 97.4°F | Resp 18

## 2012-11-02 DIAGNOSIS — N189 Chronic kidney disease, unspecified: Secondary | ICD-10-CM

## 2012-11-02 LAB — IRON AND TIBC
Iron: 41 ug/dL — ABNORMAL LOW (ref 42–135)
TIBC: 328 ug/dL (ref 215–435)

## 2012-11-02 MED ORDER — DARBEPOETIN ALFA-POLYSORBATE 200 MCG/0.4ML IJ SOLN
200.0000 ug | INTRAMUSCULAR | Status: DC
  Administered 2012-11-02: 200 ug via SUBCUTANEOUS

## 2012-11-02 MED ORDER — DARBEPOETIN ALFA-POLYSORBATE 200 MCG/0.4ML IJ SOLN
INTRAMUSCULAR | Status: AC
  Filled 2012-11-02: qty 0.4

## 2012-11-09 ENCOUNTER — Encounter (HOSPITAL_BASED_OUTPATIENT_CLINIC_OR_DEPARTMENT_OTHER): Payer: Self-pay | Admitting: *Deleted

## 2012-11-09 NOTE — Progress Notes (Addendum)
SPOKE W/ WIFE. NPO AFTER MN. ARRIVES AT 1030. NEEDS ISTAT 8. CURRENT EKG AND CXR IN EPIC AND CHART. WILL TAKE CLONIDINE AND LABETALOL AM OF SURG W/ SIP OF WATER.  WIFE TO CHECK IF PT TAKES NORVASC AND TIAZAC IN AM, IF SO, HE WILL TAKE THESE TOO.  RECEIVED LOV NOTE AND LAB RESULTS , PUT IN CHART.

## 2012-11-10 ENCOUNTER — Encounter (HOSPITAL_BASED_OUTPATIENT_CLINIC_OR_DEPARTMENT_OTHER): Payer: Self-pay | Admitting: *Deleted

## 2012-11-11 ENCOUNTER — Ambulatory Visit (HOSPITAL_BASED_OUTPATIENT_CLINIC_OR_DEPARTMENT_OTHER): Payer: Medicare Other | Admitting: Anesthesiology

## 2012-11-11 ENCOUNTER — Encounter (HOSPITAL_BASED_OUTPATIENT_CLINIC_OR_DEPARTMENT_OTHER): Payer: Self-pay | Admitting: Anesthesiology

## 2012-11-11 ENCOUNTER — Ambulatory Visit (HOSPITAL_BASED_OUTPATIENT_CLINIC_OR_DEPARTMENT_OTHER)
Admission: RE | Admit: 2012-11-11 | Discharge: 2012-11-11 | Disposition: A | Discharge status: Home / Self Care | Payer: Medicare Other | Source: Ambulatory Visit | Attending: Urology | Admitting: Urology

## 2012-11-11 ENCOUNTER — Encounter (HOSPITAL_BASED_OUTPATIENT_CLINIC_OR_DEPARTMENT_OTHER): Payer: Self-pay | Admitting: *Deleted

## 2012-11-11 ENCOUNTER — Encounter (HOSPITAL_BASED_OUTPATIENT_CLINIC_OR_DEPARTMENT_OTHER)
Admission: RE | Disposition: A | Discharge status: Home / Self Care | Payer: Self-pay | Source: Ambulatory Visit | Attending: Urology

## 2012-11-11 ENCOUNTER — Ambulatory Visit (HOSPITAL_COMMUNITY): Payer: Medicare Other

## 2012-11-11 DIAGNOSIS — N186 End stage renal disease: Secondary | ICD-10-CM | POA: Insufficient documentation

## 2012-11-11 DIAGNOSIS — N508 Other specified disorders of male genital organs: Secondary | ICD-10-CM | POA: Insufficient documentation

## 2012-11-11 DIAGNOSIS — N5089 Other specified disorders of the male genital organs: Secondary | ICD-10-CM | POA: Insufficient documentation

## 2012-11-11 DIAGNOSIS — I120 Hypertensive chronic kidney disease with stage 5 chronic kidney disease or end stage renal disease: Secondary | ICD-10-CM | POA: Insufficient documentation

## 2012-11-11 DIAGNOSIS — E119 Type 2 diabetes mellitus without complications: Secondary | ICD-10-CM | POA: Insufficient documentation

## 2012-11-11 DIAGNOSIS — H409 Unspecified glaucoma: Secondary | ICD-10-CM | POA: Insufficient documentation

## 2012-11-11 DIAGNOSIS — R310 Gross hematuria: Secondary | ICD-10-CM | POA: Insufficient documentation

## 2012-11-11 DIAGNOSIS — Z87891 Personal history of nicotine dependence: Secondary | ICD-10-CM | POA: Insufficient documentation

## 2012-11-11 HISTORY — DX: Other specified disorders of kidney and ureter: N28.89

## 2012-11-11 HISTORY — DX: Personal history of other endocrine, nutritional and metabolic disease: Z86.39

## 2012-11-11 HISTORY — PX: SPERMATOCELECTOMY: SHX2420

## 2012-11-11 HISTORY — DX: Elevated prostate specific antigen (PSA): R97.20

## 2012-11-11 HISTORY — PX: CYSTOSCOPY W/ RETROGRADES: SHX1426

## 2012-11-11 HISTORY — DX: Personal history of other diseases of urinary system: Z87.448

## 2012-11-11 HISTORY — DX: Chronic kidney disease, stage 5: N18.5

## 2012-11-11 HISTORY — DX: Spermatocele of epididymis, unspecified: N43.40

## 2012-11-11 HISTORY — DX: Type 2 diabetes mellitus with unspecified complications: E11.8

## 2012-11-11 HISTORY — DX: Type 2 diabetes mellitus without complications: E11.9

## 2012-11-11 HISTORY — DX: Constipation, unspecified: K59.00

## 2012-11-11 HISTORY — DX: Nocturia: R35.1

## 2012-11-11 LAB — POCT I-STAT, CHEM 8
BUN: 53 mg/dL — ABNORMAL HIGH (ref 6–23)
Calcium, Ion: 1.35 mmol/L — ABNORMAL HIGH (ref 1.13–1.30)
Chloride: 102 mEq/L (ref 96–112)
Glucose, Bld: 129 mg/dL — ABNORMAL HIGH (ref 70–99)

## 2012-11-11 LAB — GLUCOSE, CAPILLARY

## 2012-11-11 SURGERY — EXCISION, SPERMATOCELE
Anesthesia: General | Site: Ureter | Laterality: Right | Wound class: Clean

## 2012-11-11 MED ORDER — IOHEXOL 300 MG/ML  SOLN
INTRAMUSCULAR | Status: DC | PRN
  Administered 2012-11-11: 10 mL

## 2012-11-11 MED ORDER — DEXAMETHASONE SODIUM PHOSPHATE 4 MG/ML IJ SOLN
INTRAMUSCULAR | Status: DC | PRN
  Administered 2012-11-11: 8 mg via INTRAVENOUS

## 2012-11-11 MED ORDER — LACTATED RINGERS IV SOLN
INTRAVENOUS | Status: DC | PRN
  Administered 2012-11-11: 11:00:00 via INTRAVENOUS

## 2012-11-11 MED ORDER — FENTANYL CITRATE 0.05 MG/ML IJ SOLN
INTRAMUSCULAR | Status: DC | PRN
  Administered 2012-11-11: 25 ug via INTRAVENOUS
  Administered 2012-11-11: 12.5 ug via INTRAVENOUS
  Administered 2012-11-11: 25 ug via INTRAVENOUS
  Administered 2012-11-11: 12.5 ug via INTRAVENOUS
  Administered 2012-11-11: 25 ug via INTRAVENOUS
  Administered 2012-11-11 (×2): 12.5 ug via INTRAVENOUS
  Administered 2012-11-11: 50 ug via INTRAVENOUS
  Administered 2012-11-11 (×2): 12.5 ug via INTRAVENOUS

## 2012-11-11 MED ORDER — PROPOFOL 10 MG/ML IV BOLUS
INTRAVENOUS | Status: DC | PRN
  Administered 2012-11-11: 250 mg via INTRAVENOUS

## 2012-11-11 MED ORDER — MIDAZOLAM HCL 5 MG/5ML IJ SOLN
INTRAMUSCULAR | Status: DC | PRN
  Administered 2012-11-11 (×2): 1 mg via INTRAVENOUS

## 2012-11-11 MED ORDER — DEXTROSE 5 % IV SOLN
2.0000 g | INTRAVENOUS | Status: DC | PRN
  Administered 2012-11-11: 2 g via INTRAVENOUS

## 2012-11-11 MED ORDER — FENTANYL CITRATE 0.05 MG/ML IJ SOLN
25.0000 ug | INTRAMUSCULAR | Status: DC | PRN
  Filled 2012-11-11: qty 1

## 2012-11-11 MED ORDER — DEXTROSE 5 % IV SOLN
1.0000 g | Freq: Once | INTRAVENOUS | Status: DC
  Filled 2012-11-11: qty 10

## 2012-11-11 MED ORDER — 0.9 % SODIUM CHLORIDE (POUR BTL) OPTIME
TOPICAL | Status: DC | PRN
  Administered 2012-11-11: 1000 mL

## 2012-11-11 MED ORDER — CEPHALEXIN 250 MG PO CAPS: 250.0000 mg | ORAL_CAPSULE | Freq: Two times a day (BID) | ORAL | Status: DC

## 2012-11-11 MED ORDER — OXYCODONE-ACETAMINOPHEN 5-325 MG PO TABS: 1.0000 | ORAL_TABLET | ORAL | Status: DC | PRN

## 2012-11-11 MED ORDER — PROMETHAZINE HCL 25 MG/ML IJ SOLN
6.2500 mg | INTRAMUSCULAR | Status: DC | PRN
  Filled 2012-11-11: qty 1

## 2012-11-11 MED ORDER — LIDOCAINE HCL (CARDIAC) 20 MG/ML IV SOLN
INTRAVENOUS | Status: DC | PRN
  Administered 2012-11-11: 75 mg via INTRAVENOUS

## 2012-11-11 MED ORDER — SENNA-DOCUSATE SODIUM 8.6-50 MG PO TABS: 1.0000 | ORAL_TABLET | Freq: Two times a day (BID) | ORAL | Status: DC

## 2012-11-11 MED ORDER — ONDANSETRON HCL 4 MG/2ML IJ SOLN
INTRAMUSCULAR | Status: DC | PRN
  Administered 2012-11-11: 4 mg via INTRAVENOUS

## 2012-11-11 MED ORDER — LACTATED RINGERS IV SOLN
INTRAVENOUS | Status: DC
  Administered 2012-11-11: 11:00:00 via INTRAVENOUS
  Filled 2012-11-11: qty 1000

## 2012-11-11 MED ORDER — DEXTROSE 5 % IV SOLN
2.0000 g | Freq: Once | INTRAVENOUS | Status: DC
  Filled 2012-11-11: qty 2

## 2012-11-11 MED ORDER — BUPIVACAINE HCL (PF) 0.25 % IJ SOLN
INTRAMUSCULAR | Status: DC | PRN
  Administered 2012-11-11: 10 mL

## 2012-11-11 SURGICAL SUPPLY — 53 items
APPLICATOR COTTON TIP 6IN STRL (MISCELLANEOUS) IMPLANT
BAG URINE DRAINAGE (UROLOGICAL SUPPLIES) IMPLANT
BAG URO CATCHER STRL LF (DRAPE) ×3 IMPLANT
BANDAGE GAUZE ELAST BULKY 4 IN (GAUZE/BANDAGES/DRESSINGS) ×3 IMPLANT
BASKET ZERO TIP NITINOL 2.4FR (BASKET) IMPLANT
BLADE SURG 15 STRL LF DISP TIS (BLADE) ×2 IMPLANT
BLADE SURG 15 STRL SS (BLADE) ×1
BLADE SURG ROTATE 9660 (MISCELLANEOUS) ×3 IMPLANT
CATH FOLEY 2WAY SLVR  5CC 16FR (CATHETERS)
CATH FOLEY 2WAY SLVR 5CC 16FR (CATHETERS) IMPLANT
CATH INTERMIT  6FR 70CM (CATHETERS) ×3 IMPLANT
CLOTH BEACON ORANGE TIMEOUT ST (SAFETY) ×3 IMPLANT
COVER MAYO STAND STRL (DRAPES) ×3 IMPLANT
COVER PROBE W GEL 5X96 (DRAPES) IMPLANT
COVER TABLE BACK 60X90 (DRAPES) ×3 IMPLANT
DERMABOND ADVANCED (GAUZE/BANDAGES/DRESSINGS) ×1
DERMABOND ADVANCED .7 DNX12 (GAUZE/BANDAGES/DRESSINGS) ×2 IMPLANT
DISSECTOR ROUND CHERRY 3/8 STR (MISCELLANEOUS) IMPLANT
DRAIN PENROSE 18X1/2 LTX STRL (DRAIN) ×3 IMPLANT
DRAPE CAMERA CLOSED 9X96 (DRAPES) ×3 IMPLANT
DRAPE PED LAPAROTOMY (DRAPES) ×3 IMPLANT
DRSG PAD ABDOMINAL 8X10 ST (GAUZE/BANDAGES/DRESSINGS) ×3 IMPLANT
DRSG TEGADERM 4X4.75 (GAUZE/BANDAGES/DRESSINGS) IMPLANT
ELECT NEEDLE TIP 2.8 STRL (NEEDLE) ×3 IMPLANT
ELECT REM PT RETURN 9FT ADLT (ELECTROSURGICAL) ×3
ELECTRODE REM PT RTRN 9FT ADLT (ELECTROSURGICAL) ×2 IMPLANT
GLOVE BIO SURGEON STRL SZ7 (GLOVE) ×3 IMPLANT
GLOVE BIO SURGEON STRL SZ7.5 (GLOVE) ×6 IMPLANT
GOWN PREVENTION PLUS LG XLONG (DISPOSABLE) IMPLANT
GOWN PREVENTION PLUS XLARGE (GOWN DISPOSABLE) ×6 IMPLANT
GOWN STRL NON-REIN LRG LVL3 (GOWN DISPOSABLE) ×12 IMPLANT
GOWN STRL REIN XL XLG (GOWN DISPOSABLE) IMPLANT
GUIDEWIRE ANG ZIPWIRE 038X150 (WIRE) IMPLANT
GUIDEWIRE STR DUAL SENSOR (WIRE) ×3 IMPLANT
IV NS IRRIG 3000ML ARTHROMATIC (IV SOLUTION) ×3 IMPLANT
NEEDLE HYPO 25X1 1.5 SAFETY (NEEDLE) ×3 IMPLANT
NS IRRIG 500ML POUR BTL (IV SOLUTION) IMPLANT
PACK BASIN DAY SURGERY FS (CUSTOM PROCEDURE TRAY) ×3 IMPLANT
PACK CYSTOSCOPY (CUSTOM PROCEDURE TRAY) ×3 IMPLANT
PENCIL BUTTON HOLSTER BLD 10FT (ELECTRODE) ×3 IMPLANT
SUPPORT SCROTAL LG STRP (MISCELLANEOUS) ×3 IMPLANT
SUT MNCRL AB 4-0 PS2 18 (SUTURE) ×3 IMPLANT
SUT VIC AB 3-0 SH 27 (SUTURE) ×2
SUT VIC AB 3-0 SH 27X BRD (SUTURE) ×4 IMPLANT
SUT VIC AB 4-0 SH 27 (SUTURE) ×1
SUT VIC AB 4-0 SH 27XANBCTRL (SUTURE) ×2 IMPLANT
SYR 30ML LL (SYRINGE) IMPLANT
SYR CONTROL 10ML LL (SYRINGE) ×3 IMPLANT
TOWEL OR 17X24 6PK STRL BLUE (TOWEL DISPOSABLE) ×6 IMPLANT
TRAY DSU PREP LF (CUSTOM PROCEDURE TRAY) ×3 IMPLANT
TUBE CONNECTING 12X1/4 (SUCTIONS) IMPLANT
WATER STERILE IRR 500ML POUR (IV SOLUTION) IMPLANT
YANKAUER SUCT BULB TIP NO VENT (SUCTIONS) IMPLANT

## 2012-11-11 NOTE — Transfer of Care (Signed)
Immediate Anesthesia Transfer of Care Note  Patient: Tim Armstrong  Procedure(s) Performed: Procedure(s) (LRB): right Orchidopexy (Right) CYSTOSCOPY WITH RETROGRADE PYELOGRAM (Bilateral)  Patient Location: PACU  Anesthesia Type: General  Level of Consciousness: awake, sedated, patient cooperative and responds to stimulation  Airway & Oxygen Therapy: Patient Spontanous Breathing and Patient connected to face mask oxygen  Post-op Assessment: Report given to PACU RN, Post -op Vital signs reviewed and stable and Patient moving all extremities  Post vital signs: Reviewed and stable  Complications: No apparent anesthesia complications

## 2012-11-11 NOTE — Brief Op Note (Signed)
11/11/2012  1:47 PM  PATIENT:  Tim Armstrong  67 y.o. male  PRE-OPERATIVE DIAGNOSIS:  RIGHT SPERMATOCELE, HISTORY OF HEMATURIA  POST-OPERATIVE DIAGNOSIS:  RIGHT SPERMATOCELE, HISTORY OF HEMATURIA  PROCEDURE:  Procedure(s): right Orchidopexy (Right) CYSTOSCOPY WITH RETROGRADE PYELOGRAM (Bilateral) Excision of Rt hematocele SURGEON:  Surgeon(s) and Role:    * Sebastian Ache, MD - Primary  PHYSICIAN ASSISTANT:   ASSISTANTS: none   ANESTHESIA:   general  EBL:  Total I/O In: 400 [I.V.:400] Out: -   BLOOD ADMINISTERED:none  DRAINS: Penrose drain in the inferior scrotum to wound drainage   LOCAL MEDICATIONS USED:  MARCAINE     SPECIMEN:  Source of Specimen:  Rt Hematocele sac  DISPOSITION OF SPECIMEN:  PATHOLOGY  COUNTS:  YES  TOURNIQUET:  * No tourniquets in log *  DICTATION: .Other Dictation: Dictation Number [id number]  PLAN OF CARE: Discharge to home after PACU  PATIENT DISPOSITION:  PACU - hemodynamically stable.   Delay start of Pharmacological VTE agent (>24hrs) due to surgical blood loss or risk of bleeding: yes

## 2012-11-11 NOTE — Anesthesia Procedure Notes (Signed)
Procedure Name: LMA Insertion Date/Time: 11/11/2012 12:28 PM Performed by: Jessica Priest Pre-anesthesia Checklist: Patient identified, Emergency Drugs available, Suction available and Patient being monitored Patient Re-evaluated:Patient Re-evaluated prior to inductionOxygen Delivery Method: Circle System Utilized Preoxygenation: Pre-oxygenation with 100% oxygen Intubation Type: IV induction Ventilation: Mask ventilation without difficulty LMA: LMA inserted LMA Size: 4.0 Number of attempts: 1 Airway Equipment and Method: bite block Placement Confirmation: positive ETCO2 Tube secured with: Tape Dental Injury: Teeth and Oropharynx as per pre-operative assessment

## 2012-11-11 NOTE — Anesthesia Preprocedure Evaluation (Addendum)
Anesthesia Evaluation  Patient identified by MRN, date of birth, ID band Patient awake    Reviewed: Allergy & Precautions, H&P , NPO status , Patient's Chart, lab work & pertinent test results  Airway Mallampati: II TM Distance: >3 FB Neck ROM: Full    Dental no notable dental hx.    Pulmonary former smoker,  breath sounds clear to auscultation  Pulmonary exam normal       Cardiovascular Exercise Tolerance: Good hypertension, Pt. on medications and Pt. on home beta blockers Rhythm:Regular Rate:Normal  ECG and CXR from 08-06-12 reviewed.   Neuro/Psych negative neurological ROS  negative psych ROS   GI/Hepatic negative GI ROS, Neg liver ROS,   Endo/Other  diabetes, Type 2, Oral Hypoglycemic Agents  Renal/GU CRFRenal diseaseCr 4.1  K  3.8;   CKD stage 5  No dialysis yet. Needs AV access soon.  negative genitourinary   Musculoskeletal negative musculoskeletal ROS (+)   Abdominal   Peds negative pediatric ROS (+)  Hematology negative hematology ROS (+)   Anesthesia Other Findings   Reproductive/Obstetrics negative OB ROS                         Anesthesia Physical Anesthesia Plan  ASA: III  Anesthesia Plan: General   Post-op Pain Management:    Induction: Intravenous  Airway Management Planned: LMA  Additional Equipment:   Intra-op Plan:   Post-operative Plan: Extubation in OR  Informed Consent: I have reviewed the patients History and Physical, chart, labs and discussed the procedure including the risks, benefits and alternatives for the proposed anesthesia with the patient or authorized representative who has indicated his/her understanding and acceptance.   Dental advisory given  Plan Discussed with: CRNA  Anesthesia Plan Comments:         Anesthesia Quick Evaluation
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
## 2012-11-12 ENCOUNTER — Encounter (HOSPITAL_BASED_OUTPATIENT_CLINIC_OR_DEPARTMENT_OTHER): Payer: Self-pay | Admitting: Urology

## 2012-11-12 NOTE — Op Note (Deleted)
Tim Armstrong, Tim Armstrong NO.:  [id number]  MEDICAL RECORD NO.:  [id number]  LOCATION:  [facility]                          FACILITY:  [facility]  PHYSICIAN:  Sebastian Ache, MD     DATE OF BIRTH:  August 01, 1945  DATE OF PROCEDURE: 11/11/2012 DATE OF DISCHARGE:                                OPERATIVE REPORT   PREOPERATIVE DIAGNOSIS:  Right scrotal swelling.  History of gross hematuria.  PROCEDURE: 1. Cystoscopy with bilateral retrograde pyelograms interpretation. 2. Excision of right hematocele. 3. Right orchidopexy.  ESTIMATED BLOOD LOSS:  30 mL.  COMPLICATIONS:  None.  SPECIMEN:  Right hematocele sac.  FINDINGS: 1. Unremarkable bilateral retrograde pyelogram. 2. Unremarkable urinary bladder. 3. Trilobar prostatic hypertrophy. 4. Thick and scarified right hematocele with old blood and formed     clot. 5. Small and soft right testicle. 6. Mobilization of testicle requiring right orchidopexy.  DRAINS:  One Penrose drain for the wound drainage.  INDICATION:  Mr. Hollibaugh is a pleasant 67 year old gentleman with history of gross hematuria as well as recent right scrotal swelling.  He has chronic renal insufficiency.  Cannot undergo contrast imaging as is felt that bilateral pyelogram to be warranted to complete the hematuria evaluation.  He also desired excision of the right complex spermatocele versus hematocele.  Informed consent was obtained and placed in medical record.  DESCRIPTION OF PROCEDURE:  The patient being Levin Dagostino, procedure being cystoscopy, retrograde pyelogram and hematocele excision was confirmed.  Procedure was carried out.  Time-out was performed. Intravenous antibiotics were administered.  General and LMA anesthesia was introduced.  The patient was placed into a low lithotomy position. Sterile field was created by prepping and draping the patient's penis, perineum, and proximal thighs using iodine x3.  Next, cystourethroscopy was performed,  a 22-French rigid cystoscope with 12-degree offset lens. Inspection of the anterior and posterior urethra revealed trilobar prostatic hypertrophy with some friability of the prostate.  Inspection of the urinary bladder revealed no diverticula, calcifications, papular lesions.  The right ureteral orifice was gently cannulated using a 6- French end-hole catheter and right retrograde pyelogram was seen.  Right retrograde pyelogram demonstrated a single right ureter single system right kidney, no filling defects or narrowing were noted.  There was complete drainage on drainage films.  Attention was directed to the left side.  The left ureteral orifice was cannulated using a 6-French end-hole catheter and left retrograde pyelogram seen.  Left retrograde showed a single left ureter single system left kidney. No filling defects or narrowing noted.  There was complete drainage on drainage films.  Bladder was emptied per cystoscope.  The patient was then made supine and a sterile field was created by re-prepping the patient's infraumbilical abdomen, penis, perineum, and scrotum using iodine x3.  After clipper shaving, all surgical personal changed gloves and gowns.  The area of the right scrotum was indeed a palpably enlarged, consistent with prior complex spermatocele versus hematocele. Incision was made along the median raphe, approximately 4 cm through skin dartos and the right testicular tunica and the right testicle and hematocele was delivered into the operative field, and there was a very firm rind of tissue around this consistent  with possible prior infection.  This was taken down layer by layer.  Cyst at the testicle on the vas cord could easily be identified in relationship to the hematocele.  At this point, the hematocele sac was purposely entered and approximately 200 mL of old blood and clot were evacuated.  This hematocele sac was very firm and somewhat nodular, consistent with  prior bleed.  This was carefully excised and set aside for permanent pathology.  The mucosal edges of which were coagulated with coagulation current and then oversewn using 3-0 Vicryl to prevent recurrence. Additional hemostasis was achieved using point electrocautery resulted in excellent hemostasis.  No obvious injury of the testicle, vas deferens, or cord.  The testicle on the right side had been mobilized quite substantially to perform the procedure.  It was felt that the orchidopexy was warranted to prevent future torsion as such the testicle was replaced in the right hemiscrotum with the lateral sulcus lateral and a single anchoring stitch of 3-0 Vicryl was applied between the lateral tunica albuginea and inner scrotal surface taking great care not to completely pierce the testicle.  A Penrose drain was brought through the inferior most aspect of the scrotum through a small counter incision into the area of the right hemiscrotum.  Dartos was reapproximated using running 3-0 Vicryl.  Skin was reapproximated using subcuticular Monocryl.  Drain stitch was applied.  Dermabond was applied.  Marcaine 10 mL were infiltrated as scrotal support dressing was applied. Procedure was then terminated.  The patient tolerated the procedure well.  There were no immediate periprocedural complications.  The patient was taken to postanesthesia care unit in stable condition.          ______________________________ Sebastian Ache, MD     TM/MEDQ  D:  11/11/2012  T:  11/12/2012  Job:  [id number]

## 2012-11-12 NOTE — Op Note (Signed)
NAMEAVISH, Tim Armstrong NO.:  [id number]  MEDICAL RECORD NO.:  [id number]  LOCATION:                               FACILITY:  [facility]  PHYSICIAN:  Sebastian Ache, MD     DATE OF BIRTH:  [birth year]  DATE OF PROCEDURE:  11/11/2012 DATE OF DISCHARGE:  11/11/2012                              OPERATIVE REPORT   PREOPERATIVE DIAGNOSIS:  Right scrotal swelling.  History of gross hematuria.  PROCEDURE: 1. Cystoscopy with bilateral retrograde pyelograms interpretation. 2. Excision of right hematocele. 3. Right orchidopexy.  ESTIMATED BLOOD LOSS:  30 mL.  COMPLICATIONS:  None.  SPECIMEN:  Right hematocele sac.  FINDINGS: 1. Unremarkable bilateral retrograde pyelogram. 2. Unremarkable urinary bladder. 3. Trilobar prostatic hypertrophy. 4. Thick and scarified right hematocele with old blood and formed     clot. 5. Small and soft right testicle. 6. Mobilization of testicle requiring right orchidopexy.  DRAINS:  One Penrose drain for the wound drainage.  INDICATION:  Tim Armstrong is a pleasant 67 year old gentleman with history of gross hematuria as well as recent right scrotal swelling.  He has chronic renal insufficiency.  Cannot undergo contrast imaging as is felt that bilateral pyelogram to be warranted to complete the hematuria evaluation.  He also desired excision of the right complex spermatocele versus hematocele.  Informed consent was obtained and placed in medical record.  DESCRIPTION OF PROCEDURE:  The patient being Tim Armstrong, procedure being cystoscopy, retrograde pyelogram and hematocele excision was confirmed.  Procedure was carried out.  Time-out was performed. Intravenous antibiotics were administered.  General and LMA anesthesia was introduced.  The patient was placed into a low lithotomy position. Sterile field was created by prepping and draping the patient's penis, perineum, and proximal thighs using iodine x3.  Next, cystourethroscopy was  performed, a 22-French rigid cystoscope with 12-degree offset lens. Inspection of the anterior and posterior urethra revealed trilobar prostatic hypertrophy with some friability of the prostate.  Inspection of the urinary bladder revealed no diverticula, calcifications, papular lesions.  The right ureteral orifice was gently cannulated using a 6- French end-hole catheter and right retrograde pyelogram was seen.  Right retrograde pyelogram demonstrated a single right ureter single system right kidney, no filling defects or narrowing were noted.  There was complete drainage on drainage films.  Attention was directed to the left side.  The left ureteral orifice was cannulated using a 6-French end-hole catheter and left retrograde pyelogram seen.  Left retrograde showed a single left ureter single system left kidney. No filling defects or narrowing noted.  There was complete drainage on drainage films.  Bladder was emptied per cystoscope.  The patient was then made supine and a sterile field was created by re-prepping the patient's infraumbilical abdomen, penis, perineum, and scrotum using iodine x3.  After clipper shaving, all surgical personal changed gloves and gowns.  The area of the right scrotum was indeed a palpably enlarged, consistent with prior complex spermatocele versus hematocele. Incision was made along the median raphe, approximately 4 cm through skin dartos and the right testicular outer tunics and the right testicle and hematocele was delivered into the operative field, and there was a very firm rind  of tissue around this consistent with possible prior infection.  This was taken down layer by layer.  Cyst at the testicle on the vas cord could easily be identified in relationship to the hematocele.  At this point, the hematocele sac was purposely entered and approximately 200 mL of old blood and clot were evacuated.  This hematocele sac was very firm and somewhat nodular,  consistent with prior bleed.  This was carefully excised and set aside for permanent pathology.  The mucosal edges of which were coagulated with coagulation current and then oversewn using 3-0 Vicryl to prevent recurrence. Additional hemostasis was achieved using point electrocautery resulted in excellent hemostasis.  No obvious injury of the testicle, vas deferens, or cord was noted.  The testicle on the right side had been mobilized quite substantially to perform the procedure.  It was felt that the orchidopexy was warranted to prevent future torsion as such the testicle was replaced in the right hemiscrotum with the lateral sulcus lateral and a single anchoring stitch of 3-0 Prolene was applied between the lateral tunica albuginea and inner scrotal surface taking great care not to completely pierce the testicle.  A Penrose drain was brought through the inferior most aspect of the scrotum through a small counter incision into the area of the right hemiscrotum.  Dartos was reapproximated using running 3-0 Vicryl.  Skin was reapproximated using subcuticular Monocryl.  Drain stitch was applied.  Dermabond was applied.  Marcaine 10 mL were infiltrated as scrotal support dressing was applied. Procedure was then terminated.  The patient tolerated the procedure well.  There were no immediate periprocedural complications.  The patient was taken to postanesthesia care unit in stable condition.          ______________________________ Sebastian Ache, MD     TM/MEDQ  D:  11/11/2012  T:  11/12/2012  Job:  [id number]

## 2012-11-12 NOTE — Anesthesia Postprocedure Evaluation (Signed)
  Anesthesia Post-op Note  Patient: Tim Armstrong  Procedure(s) Performed: Procedure(s) (LRB): right Orchidopexy (Right) CYSTOSCOPY WITH RETROGRADE PYELOGRAM (Bilateral)  Patient Location: PACU  Anesthesia Type: General  Level of Consciousness: awake and alert   Airway and Oxygen Therapy: Patient Spontanous Breathing  Post-op Pain: mild  Post-op Assessment: Post-op Vital signs reviewed, Patient's Cardiovascular Status Stable, Respiratory Function Stable, Patent Airway and No signs of Nausea or vomiting  Last Vitals:  Filed Vitals:   11/11/12 1542  BP: 159/83  Pulse: 62  Temp: 36.1 C  Resp: 18    Post-op Vital Signs: stable   Complications: No apparent anesthesia complications

## 2012-11-16 ENCOUNTER — Encounter (HOSPITAL_COMMUNITY)
Admission: RE | Admit: 2012-11-16 | Discharge: 2012-11-16 | Disposition: A | Discharge status: Home / Self Care | Payer: Medicare Other | Source: Ambulatory Visit | Attending: Nephrology | Admitting: Nephrology

## 2012-11-16 VITALS — BP 151/67 | HR 60 | Temp 98.5°F | Resp 18

## 2012-11-16 DIAGNOSIS — N189 Chronic kidney disease, unspecified: Secondary | ICD-10-CM | POA: Insufficient documentation

## 2012-11-16 MED ORDER — DARBEPOETIN ALFA-POLYSORBATE 200 MCG/0.4ML IJ SOLN: 200.0000 ug | INTRAMUSCULAR | Status: DC

## 2012-11-30 ENCOUNTER — Encounter (HOSPITAL_COMMUNITY): Payer: Medicare Other

## 2012-12-11 ENCOUNTER — Other Ambulatory Visit (HOSPITAL_COMMUNITY): Payer: Self-pay | Admitting: *Deleted

## 2012-12-14 ENCOUNTER — Encounter (HOSPITAL_COMMUNITY): Payer: Medicare Other

## 2012-12-17 ENCOUNTER — Encounter (HOSPITAL_COMMUNITY)
Admission: RE | Admit: 2012-12-17 | Discharge: 2012-12-17 | Disposition: A | Discharge status: Home / Self Care | Payer: Medicare Other | Source: Ambulatory Visit | Attending: Nephrology | Admitting: Nephrology

## 2012-12-17 VITALS — BP 132/70 | HR 65

## 2012-12-17 DIAGNOSIS — N189 Chronic kidney disease, unspecified: Secondary | ICD-10-CM | POA: Insufficient documentation

## 2012-12-17 LAB — RENAL FUNCTION PANEL
BUN: 55 mg/dL — ABNORMAL HIGH (ref 6–23)
CO2: 25 mEq/L (ref 19–32)
Chloride: 98 mEq/L (ref 96–112)
GFR calc Af Amer: 14 mL/min — ABNORMAL LOW (ref >90)
Glucose, Bld: 339 mg/dL — ABNORMAL HIGH (ref 70–99)
Potassium: 4.2 mEq/L (ref 3.5–5.1)
Sodium: 135 mEq/L (ref 135–145)

## 2012-12-17 LAB — IRON AND TIBC
Iron: 81 ug/dL (ref 42–135)
TIBC: 279 ug/dL (ref 215–435)
UIBC: 198 ug/dL (ref 125–400)

## 2012-12-17 LAB — FERRITIN: Ferritin: 81 ng/mL (ref 22–322)

## 2012-12-17 MED ORDER — DARBEPOETIN ALFA-POLYSORBATE 200 MCG/0.4ML IJ SOLN: 200.0000 ug | INTRAMUSCULAR | Status: DC

## 2012-12-31 ENCOUNTER — Encounter (HOSPITAL_COMMUNITY)
Admission: RE | Admit: 2012-12-31 | Discharge: 2012-12-31 | Disposition: A | Discharge status: Home / Self Care | Payer: Medicare Other | Source: Ambulatory Visit | Attending: Nephrology | Admitting: Nephrology

## 2012-12-31 VITALS — BP 106/66 | HR 66 | Temp 97.6°F | Resp 18

## 2012-12-31 DIAGNOSIS — N189 Chronic kidney disease, unspecified: Secondary | ICD-10-CM

## 2012-12-31 MED ORDER — DARBEPOETIN ALFA-POLYSORBATE 200 MCG/0.4ML IJ SOLN
INTRAMUSCULAR | Status: AC
  Filled 2012-12-31: qty 0.4

## 2012-12-31 MED ORDER — DARBEPOETIN ALFA-POLYSORBATE 200 MCG/0.4ML IJ SOLN
200.0000 ug | INTRAMUSCULAR | Status: DC
  Administered 2012-12-31: 200 ug via SUBCUTANEOUS

## 2013-01-28 ENCOUNTER — Encounter (HOSPITAL_COMMUNITY)
Admission: RE | Admit: 2013-01-28 | Discharge: 2013-01-28 | Disposition: A | Discharge status: Home / Self Care | Payer: Medicare Other | Source: Ambulatory Visit | Attending: Nephrology | Admitting: Nephrology

## 2013-01-28 VITALS — BP 150/68 | HR 60 | Temp 97.4°F | Resp 16

## 2013-01-28 DIAGNOSIS — N189 Chronic kidney disease, unspecified: Secondary | ICD-10-CM

## 2013-01-28 LAB — IRON AND TIBC
Iron: 96 ug/dL (ref 42–135)
Saturation Ratios: 26 % (ref 20–55)
UIBC: 267 ug/dL (ref 125–400)

## 2013-01-28 LAB — RENAL FUNCTION PANEL
BUN: 62 mg/dL — ABNORMAL HIGH (ref 6–23)
CO2: 22 mEq/L (ref 19–32)
Calcium: 9.3 mg/dL (ref 8.4–10.5)
GFR calc Af Amer: 16 mL/min — ABNORMAL LOW (ref >90)
Glucose, Bld: 263 mg/dL — ABNORMAL HIGH (ref 70–99)
Phosphorus: 4.1 mg/dL (ref 2.3–4.6)

## 2013-01-28 LAB — FERRITIN: Ferritin: 55 ng/mL (ref 22–322)

## 2013-01-28 MED ORDER — DARBEPOETIN ALFA-POLYSORBATE 200 MCG/0.4ML IJ SOLN: 200.0000 ug | INTRAMUSCULAR | Status: DC

## 2013-01-29 LAB — PTH, INTACT AND CALCIUM: Calcium, Total (PTH): 9.3 mg/dL (ref 8.4–10.5)

## 2013-02-11 ENCOUNTER — Encounter (HOSPITAL_COMMUNITY): Payer: Medicare Other

## 2013-03-03 ENCOUNTER — Other Ambulatory Visit (HOSPITAL_COMMUNITY): Payer: Self-pay

## 2013-03-04 ENCOUNTER — Encounter (HOSPITAL_COMMUNITY)
Admission: RE | Admit: 2013-03-04 | Discharge: 2013-03-04 | Disposition: A | Discharge status: Home / Self Care | Payer: Medicare Other | Source: Ambulatory Visit | Attending: Nephrology | Admitting: Nephrology

## 2013-03-04 VITALS — BP 146/72 | HR 57 | Temp 97.6°F | Resp 20

## 2013-03-04 DIAGNOSIS — N189 Chronic kidney disease, unspecified: Secondary | ICD-10-CM | POA: Insufficient documentation

## 2013-03-04 MED ORDER — DARBEPOETIN ALFA-POLYSORBATE 200 MCG/0.4ML IJ SOLN
INTRAMUSCULAR | Status: AC
  Administered 2013-03-04: 200 ug via SUBCUTANEOUS
  Filled 2013-03-04: qty 0.4

## 2013-03-04 MED ORDER — DARBEPOETIN ALFA-POLYSORBATE 200 MCG/0.4ML IJ SOLN: 200.0000 ug | INTRAMUSCULAR | Status: DC

## 2013-03-05 LAB — IRON AND TIBC
Iron: 101 ug/dL (ref 42–135)
Saturation Ratios: 31 % (ref 20–55)
UIBC: 227 ug/dL (ref 125–400)

## 2013-03-05 LAB — FERRITIN: Ferritin: 221 ng/mL (ref 22–322)

## 2013-04-01 ENCOUNTER — Encounter (HOSPITAL_COMMUNITY)
Admission: RE | Admit: 2013-04-01 | Discharge: 2013-04-01 | Disposition: A | Discharge status: Home / Self Care | Payer: Medicare Other | Source: Ambulatory Visit | Attending: Nephrology | Admitting: Nephrology

## 2013-04-01 VITALS — BP 138/58 | HR 61 | Temp 97.6°F | Resp 20

## 2013-04-01 DIAGNOSIS — N189 Chronic kidney disease, unspecified: Secondary | ICD-10-CM | POA: Insufficient documentation

## 2013-04-01 LAB — IRON AND TIBC: TIBC: 319 ug/dL (ref 215–435)

## 2013-04-01 LAB — POCT HEMOGLOBIN-HEMACUE: Hemoglobin: 11.8 g/dL — ABNORMAL LOW (ref 13.0–17.0)

## 2013-04-01 MED ORDER — DARBEPOETIN ALFA-POLYSORBATE 200 MCG/0.4ML IJ SOLN: 200.0000 ug | INTRAMUSCULAR | Status: DC

## 2013-04-01 MED ORDER — DARBEPOETIN ALFA-POLYSORBATE 200 MCG/0.4ML IJ SOLN
INTRAMUSCULAR | Status: AC
  Administered 2013-04-01: 200 ug via SUBCUTANEOUS
  Filled 2013-04-01: qty 0.4

## 2013-04-29 ENCOUNTER — Encounter (HOSPITAL_COMMUNITY)
Admission: RE | Admit: 2013-04-29 | Discharge: 2013-04-29 | Disposition: A | Discharge status: Home / Self Care | Payer: Medicare Other | Source: Ambulatory Visit | Attending: Nephrology | Admitting: Nephrology

## 2013-04-29 VITALS — BP 165/75 | HR 64 | Temp 98.3°F | Resp 20

## 2013-04-29 DIAGNOSIS — N189 Chronic kidney disease, unspecified: Secondary | ICD-10-CM | POA: Insufficient documentation

## 2013-04-29 LAB — RENAL FUNCTION PANEL
CO2: 22 mEq/L (ref 19–32)
Chloride: 98 mEq/L (ref 96–112)
Creatinine, Ser: 4.72 mg/dL — ABNORMAL HIGH (ref 0.50–1.35)
GFR calc Af Amer: 13 mL/min — ABNORMAL LOW (ref >90)
GFR calc non Af Amer: 12 mL/min — ABNORMAL LOW (ref >90)
Potassium: 4.1 mEq/L (ref 3.5–5.1)
Sodium: 135 mEq/L (ref 135–145)

## 2013-04-29 LAB — IRON AND TIBC
Iron: 94 ug/dL (ref 42–135)
Saturation Ratios: 28 % (ref 20–55)
TIBC: 335 ug/dL (ref 215–435)

## 2013-04-29 MED ORDER — DARBEPOETIN ALFA-POLYSORBATE 200 MCG/0.4ML IJ SOLN: 200.0000 ug | INTRAMUSCULAR | Status: DC

## 2013-04-30 LAB — PTH, INTACT AND CALCIUM: PTH: 238.2 pg/mL — ABNORMAL HIGH (ref 14.0–72.0)

## 2013-05-13 ENCOUNTER — Encounter (HOSPITAL_COMMUNITY)
Admission: RE | Admit: 2013-05-13 | Discharge: 2013-05-13 | Disposition: A | Discharge status: Home / Self Care | Payer: Medicare Other | Source: Ambulatory Visit | Attending: Nephrology | Admitting: Nephrology

## 2013-05-13 VITALS — BP 171/81 | HR 67 | Temp 98.2°F | Resp 20

## 2013-05-13 DIAGNOSIS — N189 Chronic kidney disease, unspecified: Secondary | ICD-10-CM

## 2013-05-13 MED ORDER — DARBEPOETIN ALFA-POLYSORBATE 200 MCG/0.4ML IJ SOLN: 200.0000 ug | INTRAMUSCULAR | Status: DC

## 2013-05-27 ENCOUNTER — Encounter (HOSPITAL_COMMUNITY): Payer: Medicare Other

## 2013-08-09 ENCOUNTER — Other Ambulatory Visit (HOSPITAL_COMMUNITY): Payer: Self-pay | Admitting: Urology

## 2013-08-09 DIAGNOSIS — N186 End stage renal disease: Secondary | ICD-10-CM

## 2013-08-09 DIAGNOSIS — N281 Cyst of kidney, acquired: Secondary | ICD-10-CM

## 2013-08-19 ENCOUNTER — Ambulatory Visit (HOSPITAL_COMMUNITY)
Admission: RE | Admit: 2013-08-19 | Discharge: 2013-08-19 | Disposition: A | Discharge status: Home / Self Care | Payer: Medicare Other | Source: Ambulatory Visit | Attending: Urology | Admitting: Urology

## 2013-08-19 DIAGNOSIS — N186 End stage renal disease: Secondary | ICD-10-CM | POA: Insufficient documentation

## 2013-08-19 DIAGNOSIS — N281 Cyst of kidney, acquired: Secondary | ICD-10-CM | POA: Insufficient documentation

## 2013-09-14 ENCOUNTER — Other Ambulatory Visit: Payer: Self-pay | Admitting: *Deleted

## 2013-09-14 DIAGNOSIS — Z01810 Encounter for preprocedural cardiovascular examination: Secondary | ICD-10-CM

## 2013-09-14 DIAGNOSIS — N186 End stage renal disease: Secondary | ICD-10-CM

## 2013-09-20 ENCOUNTER — Encounter: Payer: Self-pay | Admitting: Vascular Surgery

## 2013-09-21 ENCOUNTER — Ambulatory Visit: Payer: Medicare Other | Admitting: Vascular Surgery

## 2013-09-21 ENCOUNTER — Encounter (HOSPITAL_COMMUNITY): Payer: Medicare Other

## 2013-09-21 ENCOUNTER — Other Ambulatory Visit (HOSPITAL_COMMUNITY): Payer: Medicare Other

## 2013-10-21 ENCOUNTER — Encounter (HOSPITAL_COMMUNITY): Payer: Medicare Other

## 2013-10-21 ENCOUNTER — Ambulatory Visit: Payer: Medicare Other | Admitting: Vascular Surgery

## 2013-10-21 ENCOUNTER — Other Ambulatory Visit (HOSPITAL_COMMUNITY): Payer: Medicare Other

## 2013-10-27 ENCOUNTER — Encounter: Payer: Self-pay | Admitting: Vascular Surgery

## 2013-10-28 ENCOUNTER — Inpatient Hospital Stay (HOSPITAL_COMMUNITY): Admission: RE | Admit: 2013-10-28 | Payer: Medicare Other | Source: Ambulatory Visit

## 2013-10-28 ENCOUNTER — Ambulatory Visit: Payer: Medicare Other | Admitting: Vascular Surgery

## 2014-04-25 ENCOUNTER — Encounter: Payer: Self-pay | Admitting: Podiatry

## 2014-04-25 ENCOUNTER — Ambulatory Visit (INDEPENDENT_AMBULATORY_CARE_PROVIDER_SITE_OTHER): Payer: Medicare Other | Admitting: Podiatry

## 2014-04-25 VITALS — BP 141/70 | HR 62 | Resp 13 | Ht 71.0 in | Wt 163.0 lb

## 2014-04-25 DIAGNOSIS — B351 Tinea unguium: Secondary | ICD-10-CM

## 2014-04-25 DIAGNOSIS — M79676 Pain in unspecified toe(s): Secondary | ICD-10-CM

## 2014-04-25 DIAGNOSIS — R0989 Other specified symptoms and signs involving the circulatory and respiratory systems: Secondary | ICD-10-CM

## 2014-04-25 NOTE — Progress Notes (Signed)
   Subjective:    Patient ID: Tim Armstrong, male    DOB: [birth year], 68 y.o.   MRN: [id number]  HPI Comments: N toenail problem L left 2nd toenail D and O over 2 years C thick, discolored, and split A diabetic pt, toenail catches in his sock T home debridement  Pt is diabetic and requests exam and trimming of right 1-5 and left 1-5 toenails if the left 2nd is not removed.  Patient was questioning the need to remove second left toenail. Denies any previous infection in the area. He said another podiatrist recommended removal.  He denies any history of skin ulceration or claudication   Review of Systems  HENT:       Currently being treated for left ear infection.  All other systems reviewed and are negative.      Objective:   Physical Exam  Orientated x3  DP pulses 2/4 bilaterally PT left 0/4 PT right 2/4  Neurological: Ankle reflex equal and reactive bilaterally Vibratory sensation intact bilaterally Sensation to 10 g monofilament wire intact 4/5 bilaterally  Dermatological: The toenails are elongated, hypertrophic, discolored 6-10 Maximum hypertrophy second left toenail Absent fifth toenails bilaterally Surgical scar fifth right MPJ  Musculoskeletal: Hammertoe deformities 3 and 4 bilaterally No restriction ankle, subtalar, midtarsal joints bilaterally      Assessment & Plan:   Assessment: Absent left PT pulse rule out for follow care disease Satisfactory neurological status Symptomatic onychomycoses x8  Plan: Refer patient to vascular lab for a lower extremity arterial Doppler for the indication of absent left PT pulse Debrided toenails x8 without a bleeding  Notify patient of results arterial Doppler  Reappoint at three-month intervals for nail debridement

## 2014-04-25 NOTE — Patient Instructions (Signed)
The vascular lab we'll contact you to schedule a lower extremity arterial Doppler to check the circulation into your feet. Will notify of results of the Doppler examination  Diabetes and Foot Care Diabetes may cause you to have problems because of poor blood supply (circulation) to your feet and legs. This may cause the skin on your feet to become thinner, break easier, and heal more slowly. Your skin may become dry, and the skin may peel and crack. You may also have nerve damage in your legs and feet causing decreased feeling in them. You may not notice minor injuries to your feet that could lead to infections or more serious problems. Taking care of your feet is one of the most important things you can do for yourself.  HOME CARE INSTRUCTIONS  Wear shoes at all times, even in the house. Do not go barefoot. Bare feet are easily injured.  Check your feet daily for blisters, cuts, and redness. If you cannot see the bottom of your feet, use a mirror or ask someone for help.  Wash your feet with warm water (do not use hot water) and mild soap. Then pat your feet and the areas between your toes until they are completely dry. Do not soak your feet as this can dry your skin.  Apply a moisturizing lotion or petroleum jelly (that does not contain alcohol and is unscented) to the skin on your feet and to dry, brittle toenails. Do not apply lotion between your toes.  Trim your toenails straight across. Do not dig under them or around the cuticle. File the edges of your nails with an emery board or nail file.  Do not cut corns or calluses or try to remove them with medicine.  Wear clean socks or stockings every day. Make sure they are not too tight. Do not wear knee-high stockings since they may decrease blood flow to your legs.  Wear shoes that fit properly and have enough cushioning. To break in new shoes, wear them for just a few hours a day. This prevents you from injuring your feet. Always look in your  shoes before you put them on to be sure there are no objects inside.  Do not cross your legs. This may decrease the blood flow to your feet.  If you find a minor scrape, cut, or break in the skin on your feet, keep it and the skin around it clean and dry. These areas may be cleansed with mild soap and water. Do not cleanse the area with peroxide, alcohol, or iodine.  When you remove an adhesive bandage, be sure not to damage the skin around it.  If you have a wound, look at it several times a day to make sure it is healing.  Do not use heating pads or hot water bottles. They may burn your skin. If you have lost feeling in your feet or legs, you may not know it is happening until it is too late.  Make sure your health care provider performs a complete foot exam at least annually or more often if you have foot problems. Report any cuts, sores, or bruises to your health care provider immediately. SEEK MEDICAL CARE IF:   You have an injury that is not healing.  You have cuts or breaks in the skin.  You have an ingrown nail.  You notice redness on your legs or feet.  You feel burning or tingling in your legs or feet.  You have pain or cramps  in your legs and feet.  Your legs or feet are numb.  Your feet always feel cold. SEEK IMMEDIATE MEDICAL CARE IF:   There is increasing redness, swelling, or pain in or around a wound.  There is a red line that goes up your leg.  Pus is coming from a wound.  You develop a fever or as directed by your health care provider.  You notice a bad smell coming from an ulcer or wound. Document Released: 06/28/2000 Document Revised: 03/03/2013 Document Reviewed: 12/08/2012 [facility] Patient Information 2015 [place], [place]. This information is not intended to replace advice given to you by your health care provider. Make sure you discuss any questions you have with your health care provider.

## 2014-04-26 ENCOUNTER — Encounter: Payer: Self-pay | Admitting: Podiatry

## 2014-04-29 ENCOUNTER — Inpatient Hospital Stay (HOSPITAL_COMMUNITY): Admission: RE | Admit: 2014-04-29 | Payer: Medicare Other | Source: Ambulatory Visit

## 2014-05-02 ENCOUNTER — Inpatient Hospital Stay (HOSPITAL_COMMUNITY): Admission: RE | Admit: 2014-05-02 | Payer: Medicare Other | Source: Ambulatory Visit

## 2014-05-09 ENCOUNTER — Ambulatory Visit (HOSPITAL_COMMUNITY)
Admission: RE | Admit: 2014-05-09 | Discharge: 2014-05-09 | Disposition: A | Discharge status: Home / Self Care | Payer: Medicare Other | Source: Ambulatory Visit | Attending: Cardiovascular Disease | Admitting: Cardiovascular Disease

## 2014-05-09 DIAGNOSIS — R0989 Other specified symptoms and signs involving the circulatory and respiratory systems: Secondary | ICD-10-CM | POA: Insufficient documentation

## 2014-05-09 DIAGNOSIS — I10 Essential (primary) hypertension: Secondary | ICD-10-CM | POA: Diagnosis not present

## 2014-05-09 DIAGNOSIS — Z720 Tobacco use: Secondary | ICD-10-CM | POA: Diagnosis not present

## 2014-05-09 DIAGNOSIS — E119 Type 2 diabetes mellitus without complications: Secondary | ICD-10-CM | POA: Insufficient documentation

## 2014-05-09 NOTE — Progress Notes (Signed)
Arterial Duplex Lower Ext. Completed. Ekin Pilar, BS, RDMS, RVT  

## 2014-05-19 ENCOUNTER — Telehealth: Payer: Self-pay | Admitting: *Deleted

## 2014-05-19 NOTE — Telephone Encounter (Signed)
-----   Message from Kendell Bane, [place] sent at 05/19/2014 10:29 AM EST ----- Results of lower extremity arterial Doppler 05/16/2014 No follow-up required on this examination Notify patient that blood flow is adequate in his legs and feet

## 2014-05-19 NOTE — Telephone Encounter (Signed)
I called and left the patient a message that his doppler results came back normal.  If you have any questions give me a call.

## 2014-06-03 ENCOUNTER — Telehealth: Payer: Self-pay | Admitting: *Deleted

## 2014-06-03 NOTE — Telephone Encounter (Signed)
-----   Message from Kendell Bane, [place] sent at 05/26/2014  9:03 AM EST ----- Results of lower extremity arterial Doppler 05/16/2014 No follow-up required on this examination Notify patient that blood flow is adequate in his legs and feet

## 2014-06-03 NOTE — Telephone Encounter (Signed)
I attempted to call the patient and inform him that his doppler study was normal per Dr. Amalia Hailey.  I left a message for a return call.

## 2014-07-28 ENCOUNTER — Encounter (HOSPITAL_COMMUNITY): Payer: Self-pay | Admitting: Vascular Surgery

## 2014-08-01 ENCOUNTER — Ambulatory Visit: Payer: Medicare Other | Admitting: Podiatry

## 2014-08-22 ENCOUNTER — Ambulatory Visit: Payer: Medicare Other | Admitting: Podiatry

## 2014-08-26 IMAGING — US US SCROTUM
1 series · 13 of 25 positions shown · non-contrast
Comparison: None

CLINICAL DATA: 67-year-old male with right scrotal swelling.

SCROTAL ULTRASOUND
DOPPLER ULTRASOUND OF THE TESTICLES
TECHNIQUE: Complete ultrasound examination of the testicles,
epididymis, and other scrotal structures was performed.  Color and
spectral Doppler ultrasound were also utilized to evaluate blood
flow to the testicles.

[Series 1: us scrotum · 0.11mm/px · 93 acquisitions, 13 frames shown]
[im 1/93]
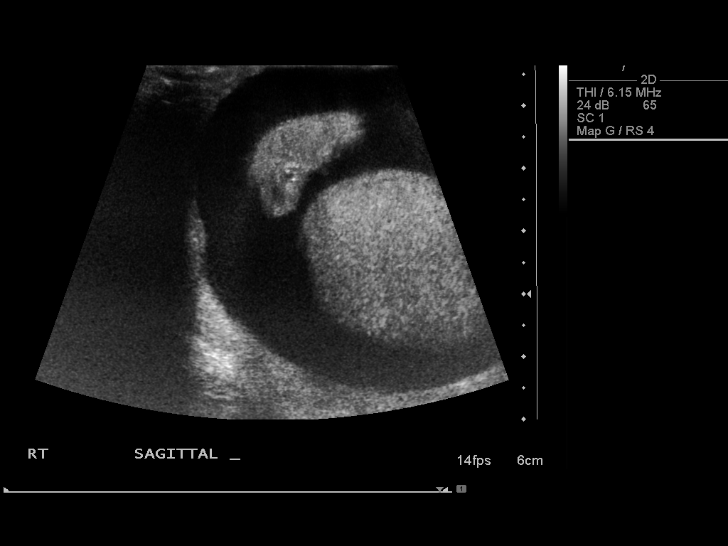
[im 8/93]
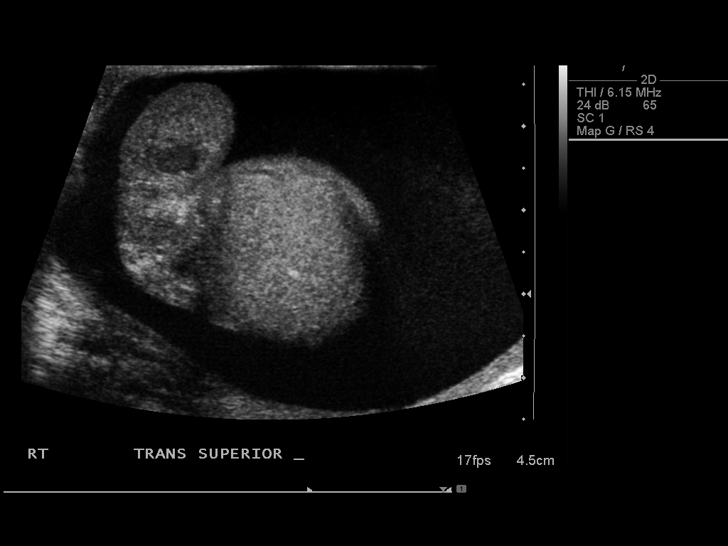
[im 16/93]
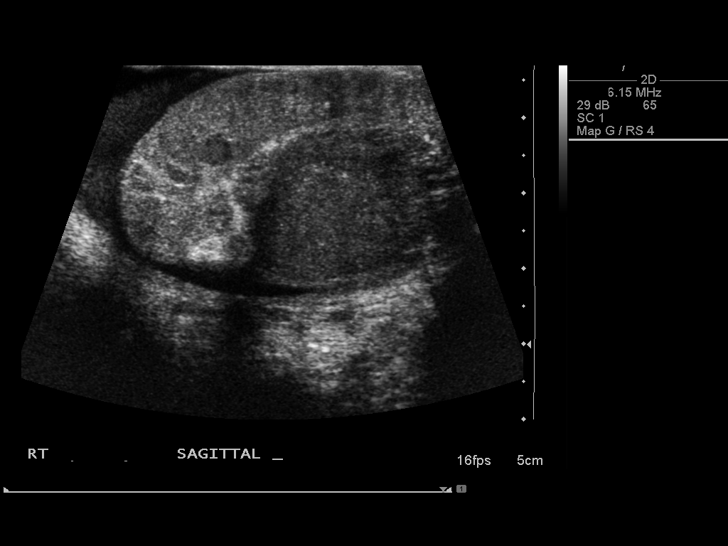
[im 24/93]
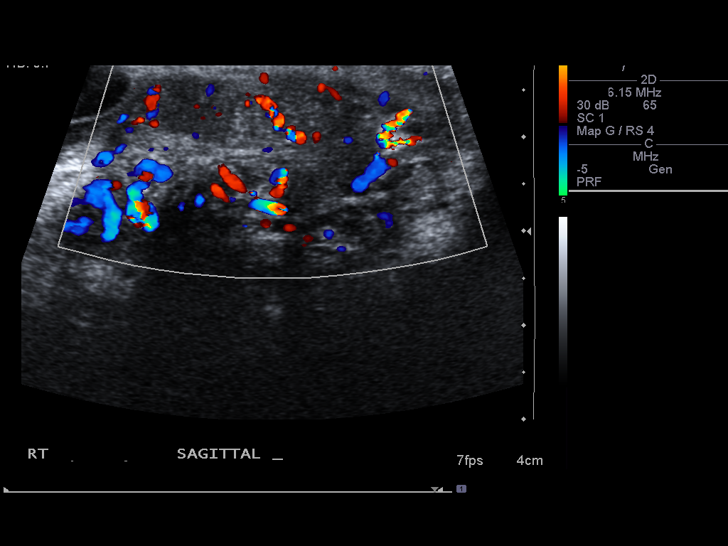
[im 31/93]
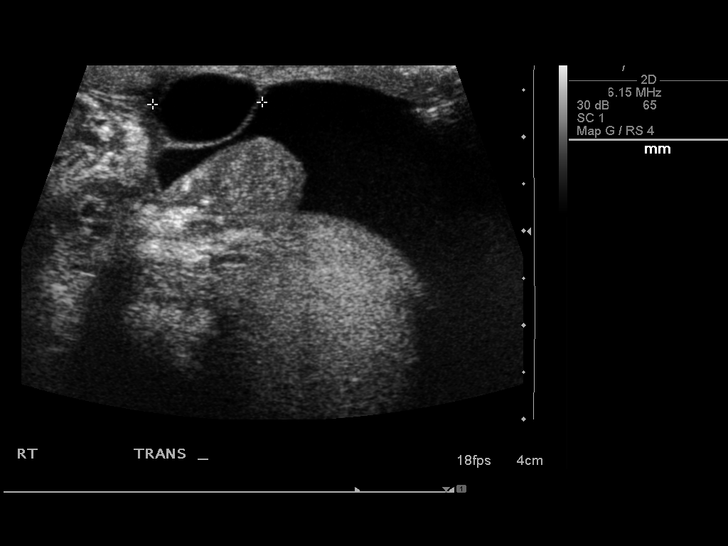
[im 39/93]
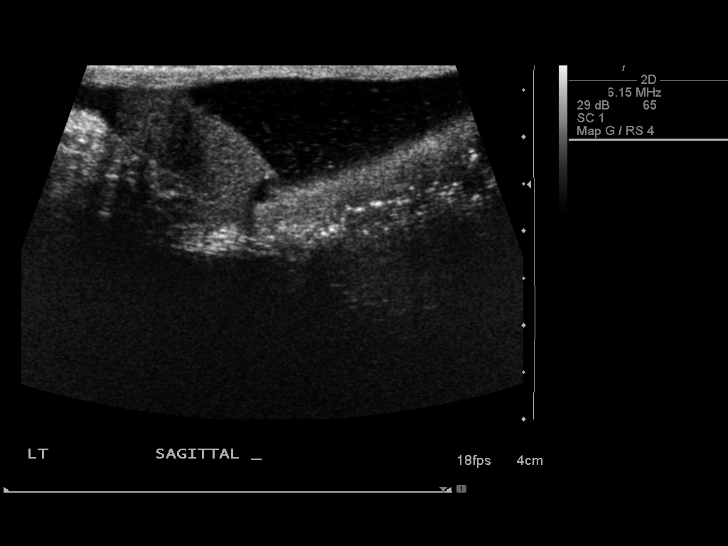
[im 47/93]
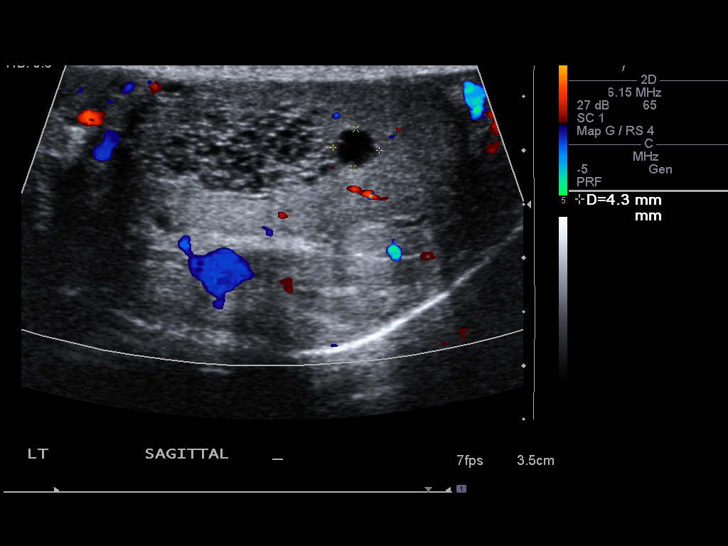
[im 54/93]
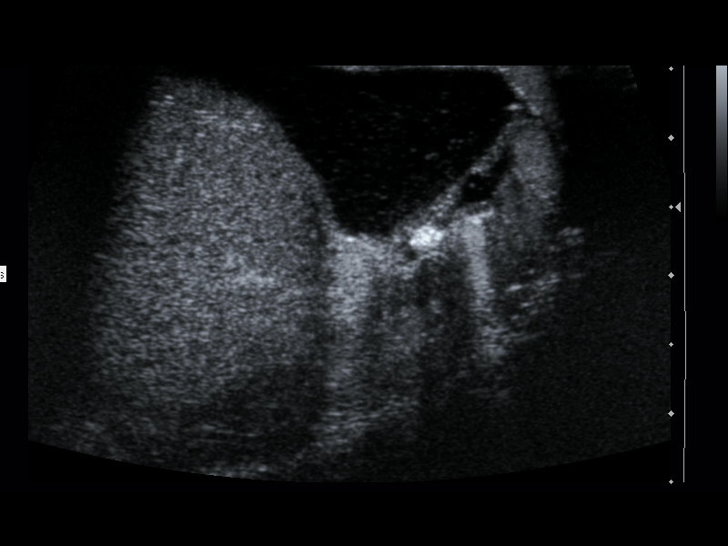
[im 62/93]
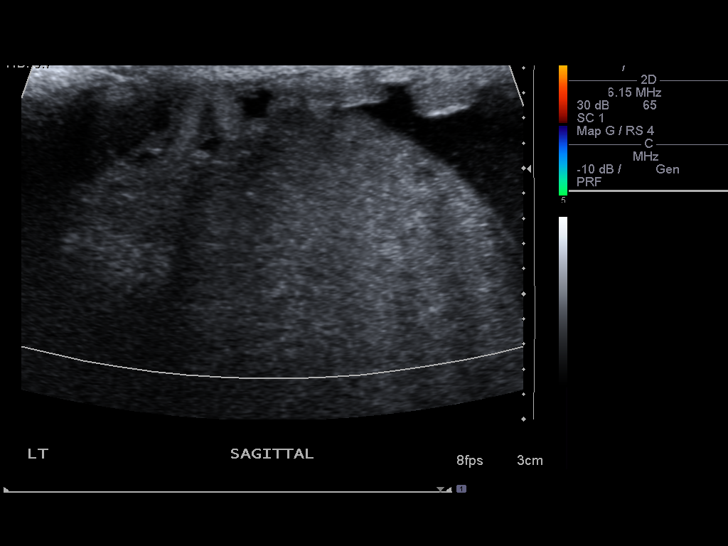
[im 70/93]
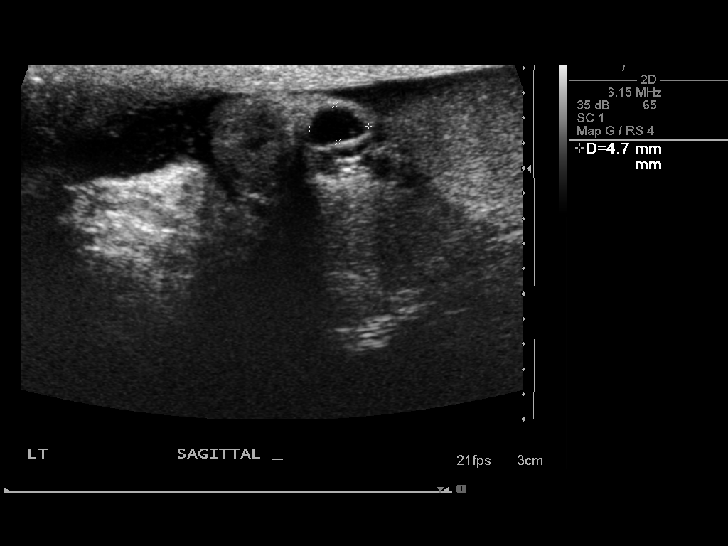
[im 77/93]
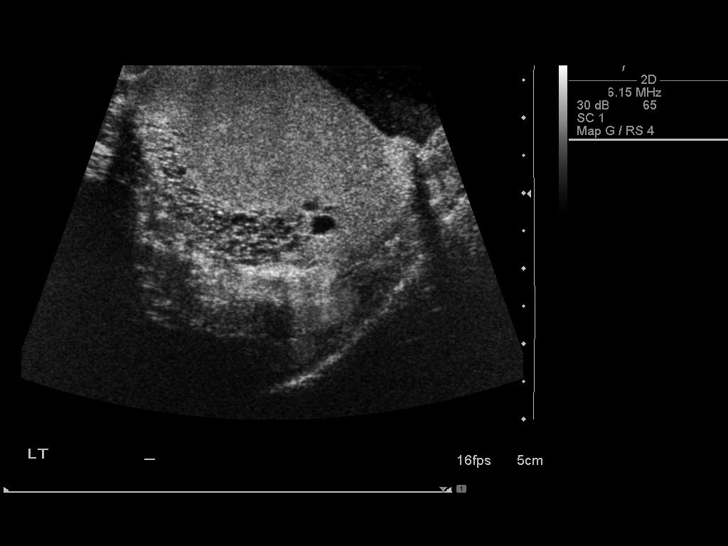
[im 85/93]
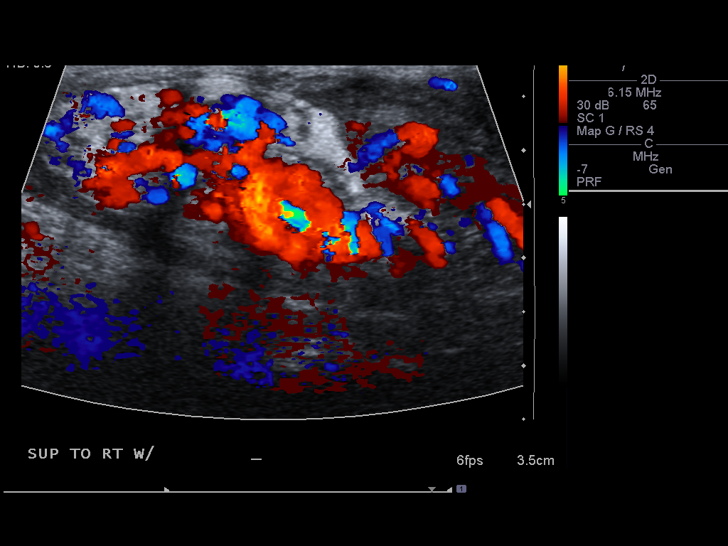
[im 93/93]
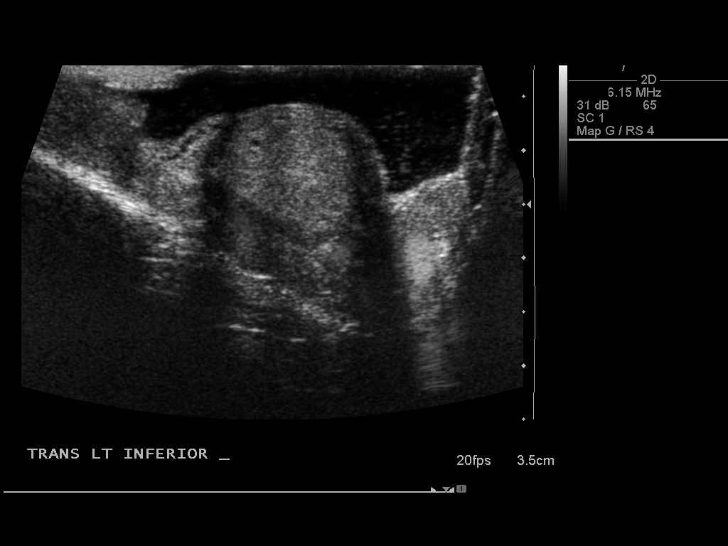

[13 of 25 positions shown; findings below may reference images not displayed]

FINDINGS: Right testis:  The right testicle is normal in size and
echogenicity measuring 4.8 x 2.9 x 3.6 cm.  No focal testicular
masses are identified.  Increased vascularity of the right testicle
is noted.  Color Doppler flow and arterial/venous waveforms are
noted.

Left testis:  The left testicle is normal in size and echogenicity
measuring 4 x 2.7 x 2.6 cm.  There is no evidence of solid mass.
Normal color Doppler flow and arterial/venous waveforms are noted.

Right epididymis:  The right epididymis is not enlarged and
hypervascular compatible with epididymitis.

Left epididymis:  Small spermatoceles/epididymal cysts are noted.

Hydrocele:  A large right hydrocele is noted.
A moderate left hydrocele with internal debris/echoes is noted.

Varicocele:  A left varicocele is present.

Pulsed Doppler interrogation of both testes demonstrates low
resistance flow bilaterally.
IMPRESSION: Enlarged right epididymis with increased vascularity in both the
right epididymis and right testicle - compatible with right
epididymo-orchitis.  Associated large right hydrocele.

Moderate left hydrocele with low level echoes/debris.

No evidence of testicular torsion or focal testicular mass.

Left varicocele.

## 2016-03-24 ENCOUNTER — Encounter (HOSPITAL_COMMUNITY): Payer: Self-pay | Admitting: Emergency Medicine

## 2016-03-24 ENCOUNTER — Inpatient Hospital Stay (HOSPITAL_COMMUNITY)
Admission: EM | Admit: 2016-03-24 | Discharge: 2016-04-02 | DRG: 673 | Disposition: A | Discharge status: Home Health | Payer: Medicare HMO | Attending: Internal Medicine | Admitting: Internal Medicine

## 2016-03-24 DIAGNOSIS — D631 Anemia in chronic kidney disease: Secondary | ICD-10-CM | POA: Diagnosis present

## 2016-03-24 DIAGNOSIS — E1129 Type 2 diabetes mellitus with other diabetic kidney complication: Secondary | ICD-10-CM | POA: Diagnosis present

## 2016-03-24 DIAGNOSIS — K648 Other hemorrhoids: Secondary | ICD-10-CM | POA: Diagnosis present

## 2016-03-24 DIAGNOSIS — E1122 Type 2 diabetes mellitus with diabetic chronic kidney disease: Secondary | ICD-10-CM | POA: Diagnosis present

## 2016-03-24 DIAGNOSIS — N186 End stage renal disease: Secondary | ICD-10-CM | POA: Diagnosis present

## 2016-03-24 DIAGNOSIS — R9431 Abnormal electrocardiogram [ECG] [EKG]: Secondary | ICD-10-CM | POA: Diagnosis present

## 2016-03-24 DIAGNOSIS — K5730 Diverticulosis of large intestine without perforation or abscess without bleeding: Secondary | ICD-10-CM | POA: Diagnosis present

## 2016-03-24 DIAGNOSIS — I120 Hypertensive chronic kidney disease with stage 5 chronic kidney disease or end stage renal disease: Secondary | ICD-10-CM | POA: Diagnosis present

## 2016-03-24 DIAGNOSIS — R55 Syncope and collapse: Secondary | ICD-10-CM | POA: Diagnosis present

## 2016-03-24 DIAGNOSIS — D124 Benign neoplasm of descending colon: Secondary | ICD-10-CM | POA: Diagnosis present

## 2016-03-24 DIAGNOSIS — N2581 Secondary hyperparathyroidism of renal origin: Secondary | ICD-10-CM | POA: Diagnosis present

## 2016-03-24 DIAGNOSIS — Y95 Nosocomial condition: Secondary | ICD-10-CM | POA: Diagnosis present

## 2016-03-24 DIAGNOSIS — Z681 Body mass index (BMI) 19 or less, adult: Secondary | ICD-10-CM

## 2016-03-24 DIAGNOSIS — K922 Gastrointestinal hemorrhage, unspecified: Secondary | ICD-10-CM | POA: Diagnosis present

## 2016-03-24 DIAGNOSIS — N179 Acute kidney failure, unspecified: Principal | ICD-10-CM | POA: Diagnosis present

## 2016-03-24 DIAGNOSIS — E876 Hypokalemia: Secondary | ICD-10-CM | POA: Diagnosis not present

## 2016-03-24 DIAGNOSIS — R5381 Other malaise: Secondary | ICD-10-CM | POA: Diagnosis not present

## 2016-03-24 DIAGNOSIS — K449 Diaphragmatic hernia without obstruction or gangrene: Secondary | ICD-10-CM | POA: Diagnosis present

## 2016-03-24 DIAGNOSIS — N281 Cyst of kidney, acquired: Secondary | ICD-10-CM | POA: Diagnosis present

## 2016-03-24 DIAGNOSIS — N189 Chronic kidney disease, unspecified: Secondary | ICD-10-CM | POA: Diagnosis not present

## 2016-03-24 DIAGNOSIS — Z87891 Personal history of nicotine dependence: Secondary | ICD-10-CM

## 2016-03-24 DIAGNOSIS — Z419 Encounter for procedure for purposes other than remedying health state, unspecified: Secondary | ICD-10-CM

## 2016-03-24 DIAGNOSIS — Z79899 Other long term (current) drug therapy: Secondary | ICD-10-CM

## 2016-03-24 DIAGNOSIS — D62 Acute posthemorrhagic anemia: Secondary | ICD-10-CM | POA: Diagnosis present

## 2016-03-24 DIAGNOSIS — R634 Abnormal weight loss: Secondary | ICD-10-CM

## 2016-03-24 DIAGNOSIS — I10 Essential (primary) hypertension: Secondary | ICD-10-CM | POA: Diagnosis present

## 2016-03-24 DIAGNOSIS — Z9889 Other specified postprocedural states: Secondary | ICD-10-CM

## 2016-03-24 DIAGNOSIS — K3189 Other diseases of stomach and duodenum: Secondary | ICD-10-CM | POA: Diagnosis present

## 2016-03-24 DIAGNOSIS — E11649 Type 2 diabetes mellitus with hypoglycemia without coma: Secondary | ICD-10-CM | POA: Diagnosis present

## 2016-03-24 DIAGNOSIS — E861 Hypovolemia: Secondary | ICD-10-CM | POA: Diagnosis present

## 2016-03-24 DIAGNOSIS — J189 Pneumonia, unspecified organism: Secondary | ICD-10-CM | POA: Diagnosis not present

## 2016-03-24 DIAGNOSIS — Z7984 Long term (current) use of oral hypoglycemic drugs: Secondary | ICD-10-CM

## 2016-03-24 DIAGNOSIS — E860 Dehydration: Secondary | ICD-10-CM | POA: Diagnosis present

## 2016-03-24 DIAGNOSIS — R42 Dizziness and giddiness: Secondary | ICD-10-CM

## 2016-03-24 LAB — COMPREHENSIVE METABOLIC PANEL
ALBUMIN: 3.8 g/dL (ref 3.5–5.0)
ALT: 12 U/L — ABNORMAL LOW (ref 17–63)
ANION GAP: 12 (ref 5–15)
AST: 21 U/L (ref 15–41)
Alkaline Phosphatase: 132 U/L — ABNORMAL HIGH (ref 38–126)
BILIRUBIN TOTAL: 0.3 mg/dL (ref 0.3–1.2)
BUN: 86 mg/dL — AB (ref 6–20)
CO2: 21 mmol/L — ABNORMAL LOW (ref 22–32)
Calcium: 7.6 mg/dL — ABNORMAL LOW (ref 8.9–10.3)
Chloride: 103 mmol/L (ref 101–111)
Creatinine, Ser: 7.46 mg/dL — ABNORMAL HIGH (ref 0.61–1.24)
GFR calc Af Amer: 8 mL/min — ABNORMAL LOW (ref >60)
GFR calc non Af Amer: 6 mL/min — ABNORMAL LOW (ref >60)
GLUCOSE: 257 mg/dL — AB (ref 65–99)
Potassium: 4 mmol/L (ref 3.5–5.1)
Sodium: 136 mmol/L (ref 135–145)
TOTAL PROTEIN: 7.1 g/dL (ref 6.5–8.1)

## 2016-03-24 LAB — URINALYSIS, ROUTINE W REFLEX MICROSCOPIC
Bilirubin Urine: NEGATIVE
GLUCOSE, UA: 250 mg/dL — AB
HGB URINE DIPSTICK: NEGATIVE
KETONES UR: NEGATIVE mg/dL
Leukocytes, UA: NEGATIVE
Nitrite: NEGATIVE
Protein, ur: NEGATIVE mg/dL
Specific Gravity, Urine: 1.013 (ref 1.005–1.030)
pH: 5.5 (ref 5.0–8.0)

## 2016-03-24 LAB — I-STAT CHEM 8, ED
BUN: 79 mg/dL — AB (ref 6–20)
CREATININE: 7.5 mg/dL — AB (ref 0.61–1.24)
Calcium, Ion: 0.97 mmol/L — ABNORMAL LOW (ref 1.15–1.40)
Chloride: 101 mmol/L (ref 101–111)
Glucose, Bld: 237 mg/dL — ABNORMAL HIGH (ref 65–99)
HCT: 24 % — ABNORMAL LOW (ref 39.0–52.0)
HEMOGLOBIN: 8.2 g/dL — AB (ref 13.0–17.0)
POTASSIUM: 4.2 mmol/L (ref 3.5–5.1)
Sodium: 139 mmol/L (ref 135–145)
TCO2: 21 mmol/L (ref 0–100)

## 2016-03-24 LAB — CBC WITH DIFFERENTIAL/PLATELET
BASOS PCT: 0 %
Basophils Absolute: 0 10*3/uL (ref 0.0–0.1)
Eosinophils Absolute: 0.2 10*3/uL (ref 0.0–0.7)
Eosinophils Relative: 3 %
HEMATOCRIT: 24.7 % — AB (ref 39.0–52.0)
HEMOGLOBIN: 8.4 g/dL — AB (ref 13.0–17.0)
Lymphocytes Relative: 14 %
Lymphs Abs: 1 10*3/uL (ref 0.7–4.0)
MCH: 25 pg — ABNORMAL LOW (ref 26.0–34.0)
MCHC: 34 g/dL (ref 30.0–36.0)
MCV: 73.5 fL — ABNORMAL LOW (ref 78.0–100.0)
MONOS PCT: 4 %
Monocytes Absolute: 0.3 10*3/uL (ref 0.1–1.0)
NEUTROS PCT: 79 %
Neutro Abs: 5.8 10*3/uL (ref 1.7–7.7)
Platelets: 214 10*3/uL (ref 150–400)
RBC: 3.36 MIL/uL — ABNORMAL LOW (ref 4.22–5.81)
RDW: 15.6 % — ABNORMAL HIGH (ref 11.5–15.5)
WBC: 7.4 10*3/uL (ref 4.0–10.5)

## 2016-03-24 LAB — CBG MONITORING, ED: Glucose-Capillary: 254 mg/dL — ABNORMAL HIGH (ref 65–99)

## 2016-03-24 LAB — GLUCOSE, CAPILLARY: GLUCOSE-CAPILLARY: 217 mg/dL — AB (ref 65–99)

## 2016-03-24 MED ORDER — HYDRALAZINE HCL 20 MG/ML IJ SOLN: 10.0000 mg | INTRAMUSCULAR | Status: DC | PRN

## 2016-03-24 MED ORDER — AMLODIPINE BESYLATE 10 MG PO TABS
10.0000 mg | ORAL_TABLET | Freq: Every day | ORAL | Status: DC
  Administered 2016-03-25: 10 mg via ORAL
  Filled 2016-03-24: qty 1

## 2016-03-24 MED ORDER — LABETALOL HCL 200 MG PO TABS
200.0000 mg | ORAL_TABLET | Freq: Two times a day (BID) | ORAL | Status: DC
  Administered 2016-03-24 – 2016-04-02 (×16): 200 mg via ORAL
  Filled 2016-03-24 (×16): qty 1

## 2016-03-24 MED ORDER — ACETAMINOPHEN 325 MG PO TABS
650.0000 mg | ORAL_TABLET | Freq: Four times a day (QID) | ORAL | Status: DC | PRN
  Administered 2016-03-28: 650 mg via ORAL
  Filled 2016-03-24: qty 2

## 2016-03-24 MED ORDER — ACETAMINOPHEN 650 MG RE SUPP: 650.0000 mg | Freq: Four times a day (QID) | RECTAL | Status: DC | PRN

## 2016-03-24 MED ORDER — CLONIDINE HCL 0.2 MG PO TABS
0.2000 mg | ORAL_TABLET | Freq: Two times a day (BID) | ORAL | Status: DC
  Administered 2016-03-24 – 2016-03-26 (×4): 0.2 mg via ORAL
  Filled 2016-03-24 (×4): qty 1

## 2016-03-24 MED ORDER — DILTIAZEM HCL ER COATED BEADS 120 MG PO CP24
240.0000 mg | ORAL_CAPSULE | Freq: Every day | ORAL | Status: DC
  Administered 2016-03-25 – 2016-04-02 (×7): 240 mg via ORAL
  Filled 2016-03-24 (×2): qty 2
  Filled 2016-03-24: qty 1
  Filled 2016-03-24 (×2): qty 2
  Filled 2016-03-24: qty 1
  Filled 2016-03-24 (×2): qty 2
  Filled 2016-03-24: qty 1

## 2016-03-24 MED ORDER — ONDANSETRON HCL 4 MG/2ML IJ SOLN: 4.0000 mg | Freq: Four times a day (QID) | INTRAMUSCULAR | Status: DC | PRN

## 2016-03-24 MED ORDER — HEPARIN SODIUM (PORCINE) 5000 UNIT/ML IJ SOLN
5000.0000 [IU] | Freq: Three times a day (TID) | INTRAMUSCULAR | Status: DC
  Administered 2016-03-24 – 2016-03-25 (×2): 5000 [IU] via SUBCUTANEOUS
  Filled 2016-03-24 (×2): qty 1

## 2016-03-24 MED ORDER — ONDANSETRON HCL 4 MG PO TABS: 4.0000 mg | ORAL_TABLET | Freq: Four times a day (QID) | ORAL | Status: DC | PRN

## 2016-03-24 MED ORDER — SODIUM CHLORIDE 0.9 % IV BOLUS (SEPSIS)
500.0000 mL | Freq: Once | INTRAVENOUS | Status: AC
  Administered 2016-03-24: 500 mL via INTRAVENOUS

## 2016-03-24 MED ORDER — CALCITRIOL 0.25 MCG PO CAPS
0.2500 ug | ORAL_CAPSULE | Freq: Every day | ORAL | Status: DC
  Administered 2016-03-25 – 2016-03-30 (×4): 0.25 ug via ORAL
  Filled 2016-03-24 (×4): qty 1

## 2016-03-24 MED ORDER — SODIUM CHLORIDE 0.9 % IV SOLN
INTRAVENOUS | Status: AC
  Administered 2016-03-24 – 2016-03-25 (×2): via INTRAVENOUS

## 2016-03-24 MED ORDER — INSULIN ASPART 100 UNIT/ML ~~LOC~~ SOLN
0.0000 [IU] | Freq: Three times a day (TID) | SUBCUTANEOUS | Status: DC
  Administered 2016-03-25 (×2): 2 [IU] via SUBCUTANEOUS
  Administered 2016-03-26: 1 [IU] via SUBCUTANEOUS
  Administered 2016-03-26 (×2): 2 [IU] via SUBCUTANEOUS
  Administered 2016-03-27 – 2016-03-29 (×4): 1 [IU] via SUBCUTANEOUS
  Administered 2016-03-30: 3 [IU] via SUBCUTANEOUS
  Administered 2016-03-31: 1 [IU] via SUBCUTANEOUS
  Administered 2016-03-31: 2 [IU] via SUBCUTANEOUS
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
## 2016-03-24 NOTE — ED Triage Notes (Addendum)
[name] Hx of DM.  He had breakfast but nothing else.  Pt was at [place] when he began to feel weak.  He saw ambulance crew there and was given a soda and something to eat.  Pt began to feel much better but chose to come to [facility] as he wants to have his Metformin dose looked at.  CBG 206 with EMS

## 2016-03-24 NOTE — ED Notes (Signed)
Pt is aware that urine sample is needed. 
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
## 2016-03-24 NOTE — ED Notes (Signed)
Pt aware of need to give urine sample

## 2016-03-24 NOTE — ED Notes (Signed)
Pt given crackers, peanut butter and milk

## 2016-03-24 NOTE — ED Notes (Signed)
Delay in beginning care due to Pt having an incontinent episode of diarrhea that required staff assistance in cleaning up.

## 2016-03-24 NOTE — ED Notes (Signed)
Bed: WA08 Expected date:  Expected time:  Means of arrival:  Comments: 70 yo felt bad-better after eating,wants eval

## 2016-03-25 ENCOUNTER — Observation Stay (HOSPITAL_COMMUNITY): Payer: Medicare HMO

## 2016-03-25 ENCOUNTER — Encounter (HOSPITAL_COMMUNITY): Payer: Self-pay | Admitting: Gastroenterology

## 2016-03-25 DIAGNOSIS — N185 Chronic kidney disease, stage 5: Secondary | ICD-10-CM | POA: Diagnosis not present

## 2016-03-25 DIAGNOSIS — Z87891 Personal history of nicotine dependence: Secondary | ICD-10-CM | POA: Diagnosis not present

## 2016-03-25 DIAGNOSIS — Z9889 Other specified postprocedural states: Secondary | ICD-10-CM | POA: Diagnosis not present

## 2016-03-25 DIAGNOSIS — Z419 Encounter for procedure for purposes other than remedying health state, unspecified: Secondary | ICD-10-CM | POA: Diagnosis not present

## 2016-03-25 DIAGNOSIS — R5381 Other malaise: Secondary | ICD-10-CM | POA: Diagnosis present

## 2016-03-25 DIAGNOSIS — Z0189 Encounter for other specified special examinations: Secondary | ICD-10-CM | POA: Diagnosis not present

## 2016-03-25 DIAGNOSIS — Z681 Body mass index (BMI) 19 or less, adult: Secondary | ICD-10-CM | POA: Diagnosis not present

## 2016-03-25 DIAGNOSIS — N189 Chronic kidney disease, unspecified: Secondary | ICD-10-CM | POA: Diagnosis not present

## 2016-03-25 DIAGNOSIS — R634 Abnormal weight loss: Secondary | ICD-10-CM | POA: Diagnosis present

## 2016-03-25 DIAGNOSIS — R42 Dizziness and giddiness: Secondary | ICD-10-CM | POA: Diagnosis not present

## 2016-03-25 DIAGNOSIS — N281 Cyst of kidney, acquired: Secondary | ICD-10-CM | POA: Diagnosis present

## 2016-03-25 DIAGNOSIS — E876 Hypokalemia: Secondary | ICD-10-CM | POA: Diagnosis not present

## 2016-03-25 DIAGNOSIS — D124 Benign neoplasm of descending colon: Secondary | ICD-10-CM | POA: Diagnosis present

## 2016-03-25 DIAGNOSIS — I120 Hypertensive chronic kidney disease with stage 5 chronic kidney disease or end stage renal disease: Secondary | ICD-10-CM | POA: Diagnosis present

## 2016-03-25 DIAGNOSIS — Z7984 Long term (current) use of oral hypoglycemic drugs: Secondary | ICD-10-CM | POA: Diagnosis not present

## 2016-03-25 DIAGNOSIS — E11649 Type 2 diabetes mellitus with hypoglycemia without coma: Secondary | ICD-10-CM | POA: Diagnosis present

## 2016-03-25 DIAGNOSIS — E861 Hypovolemia: Secondary | ICD-10-CM | POA: Diagnosis present

## 2016-03-25 DIAGNOSIS — E0821 Diabetes mellitus due to underlying condition with diabetic nephropathy: Secondary | ICD-10-CM | POA: Diagnosis not present

## 2016-03-25 DIAGNOSIS — Z01810 Encounter for preprocedural cardiovascular examination: Secondary | ICD-10-CM | POA: Diagnosis not present

## 2016-03-25 DIAGNOSIS — R9431 Abnormal electrocardiogram [ECG] [EKG]: Secondary | ICD-10-CM | POA: Diagnosis present

## 2016-03-25 DIAGNOSIS — N186 End stage renal disease: Secondary | ICD-10-CM | POA: Diagnosis present

## 2016-03-25 DIAGNOSIS — K449 Diaphragmatic hernia without obstruction or gangrene: Secondary | ICD-10-CM | POA: Diagnosis present

## 2016-03-25 DIAGNOSIS — K3189 Other diseases of stomach and duodenum: Secondary | ICD-10-CM | POA: Diagnosis present

## 2016-03-25 DIAGNOSIS — K5730 Diverticulosis of large intestine without perforation or abscess without bleeding: Secondary | ICD-10-CM | POA: Diagnosis present

## 2016-03-25 DIAGNOSIS — D631 Anemia in chronic kidney disease: Secondary | ICD-10-CM | POA: Diagnosis present

## 2016-03-25 DIAGNOSIS — E1122 Type 2 diabetes mellitus with diabetic chronic kidney disease: Secondary | ICD-10-CM | POA: Diagnosis present

## 2016-03-25 DIAGNOSIS — K648 Other hemorrhoids: Secondary | ICD-10-CM | POA: Diagnosis present

## 2016-03-25 DIAGNOSIS — I10 Essential (primary) hypertension: Secondary | ICD-10-CM | POA: Diagnosis not present

## 2016-03-25 DIAGNOSIS — Z79899 Other long term (current) drug therapy: Secondary | ICD-10-CM | POA: Diagnosis not present

## 2016-03-25 DIAGNOSIS — K922 Gastrointestinal hemorrhage, unspecified: Secondary | ICD-10-CM | POA: Diagnosis present

## 2016-03-25 DIAGNOSIS — Y95 Nosocomial condition: Secondary | ICD-10-CM | POA: Diagnosis present

## 2016-03-25 DIAGNOSIS — R55 Syncope and collapse: Secondary | ICD-10-CM | POA: Diagnosis not present

## 2016-03-25 DIAGNOSIS — D62 Acute posthemorrhagic anemia: Secondary | ICD-10-CM | POA: Diagnosis present

## 2016-03-25 DIAGNOSIS — J189 Pneumonia, unspecified organism: Secondary | ICD-10-CM | POA: Diagnosis not present

## 2016-03-25 DIAGNOSIS — N184 Chronic kidney disease, stage 4 (severe): Secondary | ICD-10-CM

## 2016-03-25 DIAGNOSIS — I340 Nonrheumatic mitral (valve) insufficiency: Secondary | ICD-10-CM | POA: Diagnosis not present

## 2016-03-25 DIAGNOSIS — N179 Acute kidney failure, unspecified: Secondary | ICD-10-CM | POA: Diagnosis present

## 2016-03-25 DIAGNOSIS — E860 Dehydration: Secondary | ICD-10-CM | POA: Diagnosis present

## 2016-03-25 LAB — OCCULT BLOOD X 1 CARD TO LAB, STOOL
FECAL OCCULT BLD: POSITIVE — AB
Fecal Occult Bld: NEGATIVE

## 2016-03-25 LAB — CBC WITH DIFFERENTIAL/PLATELET
Basophils Absolute: 0 10*3/uL (ref 0.0–0.1)
Basophils Relative: 1 %
Eosinophils Absolute: 0.2 10*3/uL (ref 0.0–0.7)
Eosinophils Relative: 4 %
HEMATOCRIT: 20.5 % — AB (ref 39.0–52.0)
Hemoglobin: 6.9 g/dL — CL (ref 13.0–17.0)
LYMPHS ABS: 1.3 10*3/uL (ref 0.7–4.0)
LYMPHS PCT: 28 %
MCH: 24.7 pg — AB (ref 26.0–34.0)
MCHC: 33.7 g/dL (ref 30.0–36.0)
MCV: 73.5 fL — AB (ref 78.0–100.0)
MONO ABS: 0.5 10*3/uL (ref 0.1–1.0)
MONOS PCT: 10 %
NEUTROS ABS: 2.7 10*3/uL (ref 1.7–7.7)
Neutrophils Relative %: 58 %
Platelets: 190 10*3/uL (ref 150–400)
RBC: 2.79 MIL/uL — ABNORMAL LOW (ref 4.22–5.81)
RDW: 15.6 % — AB (ref 11.5–15.5)
WBC: 4.7 10*3/uL (ref 4.0–10.5)

## 2016-03-25 LAB — GASTROINTESTINAL PANEL BY PCR, STOOL (REPLACES STOOL CULTURE)
Adenovirus F40/41: NOT DETECTED
Astrovirus: NOT DETECTED
CYCLOSPORA CAYETANENSIS: NOT DETECTED
Campylobacter species: NOT DETECTED
Cryptosporidium: NOT DETECTED
E. COLI O157: NOT DETECTED
ENTAMOEBA HISTOLYTICA: NOT DETECTED
Enteroaggregative E coli (EAEC): NOT DETECTED
Enteropathogenic E coli (EPEC): NOT DETECTED
Enterotoxigenic E coli (ETEC): NOT DETECTED
Giardia lamblia: NOT DETECTED
Norovirus GI/GII: NOT DETECTED
Plesimonas shigelloides: NOT DETECTED
ROTAVIRUS A: NOT DETECTED
SALMONELLA SPECIES: NOT DETECTED
SAPOVIRUS (I, II, IV, AND V): NOT DETECTED
SHIGA LIKE TOXIN PRODUCING E COLI (STEC): NOT DETECTED
SHIGELLA/ENTEROINVASIVE E COLI (EIEC): NOT DETECTED
VIBRIO CHOLERAE: NOT DETECTED
VIBRIO SPECIES: NOT DETECTED
Yersinia enterocolitica: NOT DETECTED

## 2016-03-25 LAB — GLUCOSE, CAPILLARY
GLUCOSE-CAPILLARY: 181 mg/dL — AB (ref 65–99)
GLUCOSE-CAPILLARY: 166 mg/dL — AB (ref 65–99)
GLUCOSE-CAPILLARY: 160 mg/dL — AB (ref 65–99)
Glucose-Capillary: 89 mg/dL (ref 65–99)

## 2016-03-25 LAB — IRON AND TIBC
IRON: 43 ug/dL — AB (ref 45–182)
SATURATION RATIOS: 12 % — AB (ref 17.9–39.5)
TIBC: 344 ug/dL (ref 250–450)
UIBC: 301 ug/dL

## 2016-03-25 LAB — TROPONIN I
TROPONIN I: 0.03 ng/mL — AB (ref <0.03)
Troponin I: 0.03 ng/mL (ref <0.03)
Troponin I: 0.06 ng/mL (ref <0.03)

## 2016-03-25 LAB — COMPREHENSIVE METABOLIC PANEL
ALBUMIN: 3.1 g/dL — AB (ref 3.5–5.0)
ALK PHOS: 103 U/L (ref 38–126)
ALT: 10 U/L — ABNORMAL LOW (ref 17–63)
ANION GAP: 12 (ref 5–15)
AST: 16 U/L (ref 15–41)
BUN: 82 mg/dL — ABNORMAL HIGH (ref 6–20)
CALCIUM: 7.3 mg/dL — AB (ref 8.9–10.3)
CHLORIDE: 104 mmol/L (ref 101–111)
CO2: 20 mmol/L — AB (ref 22–32)
Creatinine, Ser: 7.08 mg/dL — ABNORMAL HIGH (ref 0.61–1.24)
GFR calc Af Amer: 8 mL/min — ABNORMAL LOW (ref >60)
GFR calc non Af Amer: 7 mL/min — ABNORMAL LOW (ref >60)
GLUCOSE: 89 mg/dL (ref 65–99)
POTASSIUM: 3.3 mmol/L — AB (ref 3.5–5.1)
SODIUM: 136 mmol/L (ref 135–145)
Total Bilirubin: 0.5 mg/dL (ref 0.3–1.2)
Total Protein: 5.9 g/dL — ABNORMAL LOW (ref 6.5–8.1)

## 2016-03-25 LAB — SODIUM, URINE, RANDOM: Sodium, Ur: 75 mmol/L

## 2016-03-25 LAB — RETICULOCYTES
RBC.: 3.23 MIL/uL — ABNORMAL LOW (ref 4.22–5.81)
RETIC CT PCT: 1 % (ref 0.4–3.1)
Retic Count, Absolute: 32.3 10*3/uL (ref 19.0–186.0)

## 2016-03-25 LAB — FOLATE: FOLATE: 14.7 ng/mL (ref >5.9)

## 2016-03-25 LAB — C DIFFICILE QUICK SCREEN W PCR REFLEX
C Diff antigen: NEGATIVE
C Diff interpretation: NOT DETECTED
C Diff toxin: NEGATIVE

## 2016-03-25 LAB — VITAMIN B12: VITAMIN B 12: 667 pg/mL (ref 180–914)

## 2016-03-25 LAB — ABO/RH: ABO/RH(D): B POS

## 2016-03-25 LAB — HEMOGLOBIN AND HEMATOCRIT, BLOOD
HEMATOCRIT: 24.2 % — AB (ref 39.0–52.0)
Hemoglobin: 8.2 g/dL — ABNORMAL LOW (ref 13.0–17.0)

## 2016-03-25 LAB — CREATININE, URINE, RANDOM: Creatinine, Urine: 81.11 mg/dL

## 2016-03-25 LAB — PREPARE RBC (CROSSMATCH)

## 2016-03-25 LAB — FERRITIN: Ferritin: 11 ng/mL — ABNORMAL LOW (ref 24–336)

## 2016-03-25 MED ORDER — NEPRO/CARBSTEADY PO LIQD
237.0000 mL | Freq: Two times a day (BID) | ORAL | Status: DC
  Administered 2016-03-25 – 2016-03-31 (×10): 237 mL via ORAL
  Filled 2016-03-25 (×2): qty 237

## 2016-03-25 MED ORDER — DARBEPOETIN ALFA 40 MCG/0.4ML IJ SOSY: 40.0000 ug | PREFILLED_SYRINGE | INTRAMUSCULAR | Status: DC

## 2016-03-25 MED ORDER — SODIUM CHLORIDE 0.9 % IV SOLN
510.0000 mg | Freq: Once | INTRAVENOUS | Status: AC
  Administered 2016-03-25: 510 mg via INTRAVENOUS
  Filled 2016-03-25: qty 17

## 2016-03-25 MED ORDER — SODIUM CHLORIDE 0.9 % IV SOLN: 125.0000 mg | INTRAVENOUS | Status: DC

## 2016-03-25 MED ORDER — DARBEPOETIN ALFA 150 MCG/0.3ML IJ SOSY
150.0000 ug | PREFILLED_SYRINGE | Freq: Once | INTRAMUSCULAR | Status: AC
  Administered 2016-03-25: 150 ug via SUBCUTANEOUS
  Filled 2016-03-25: qty 0.3

## 2016-03-25 MED ORDER — POTASSIUM CHLORIDE CRYS ER 20 MEQ PO TBCR
40.0000 meq | EXTENDED_RELEASE_TABLET | Freq: Once | ORAL | Status: AC
  Administered 2016-03-25: 40 meq via ORAL
  Filled 2016-03-25: qty 2

## 2016-03-25 MED ORDER — HEPARIN SODIUM (PORCINE) 5000 UNIT/ML IJ SOLN
5000.0000 [IU] | Freq: Three times a day (TID) | INTRAMUSCULAR | Status: DC
  Administered 2016-03-25 – 2016-04-02 (×16): 5000 [IU] via SUBCUTANEOUS
  Filled 2016-03-25 (×12): qty 1

## 2016-03-25 MED ORDER — SODIUM CHLORIDE 0.9 % IV SOLN
Freq: Once | INTRAVENOUS | Status: AC
  Administered 2016-03-25: 08:00:00 via INTRAVENOUS
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
## 2016-03-25 NOTE — Progress Notes (Signed)
CRITICAL VALUE ALERT  Critical value received: Hemoglobin 6.9  Date of notification:  03/25/2016  Time of notification:  0530  Critical value read back:yes  Nurse who received alert:  Maylene Roes  MD notified (1st page):  Schorr  Time of first page:  0544  MD notified (2nd page):  Time of second page:  Responding MD:  Schorr  Time MD responded:  [phone]

## 2016-03-25 NOTE — Care Management Obs Status (Signed)
[place] NOTIFICATION   Patient Details  Name: ZEYAD MARRA MRN: [id number] Date of Birth: 1946-01-22   Medicare Observation Status Notification Given:  Yes    MahabirJuliann Pulse, RN 03/25/2016, 1:40 PM
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
## 2016-03-25 NOTE — Progress Notes (Signed)
Report called to RN Lurena Joiner at [facility]. [facility] also called to arrange transportation. Patient's belongings packed at this time. Patient aware of transfer to [name]- patient will notify his wife. Patient A&O, denies pain & discomfort. VSS. Will continue to monitor.
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
## 2016-03-25 NOTE — Progress Notes (Addendum)
PROGRESS NOTE    Tim Armstrong  [id number] DOB: 01-20-1946 DOA: 03/24/2016  PCP: Philis Fendt, MD   Brief Narrative:  Tim Armstrong is a 70 y.o. male with chronic kidney disease stage IV, hypertension, diabetes mellitus was brought to the ER from [place] where he became lightheaded and near-syncopal. Blood sugar was in the 200s. Patient states over the last 2 weeks he has been feeling fatigue and last 2 days he has been having multiple episodes of diarrhea.     In the ER patient's creatinine is found to be 7. Last creatinine in our system was around 4.7 in 2014 and at that time, he was recommended to have a fistula placed. He was lost to follow up. He was found also to have a Hb of 8 which has dropped to 6.9 with IVF given in ER.  Subjective: No complaints today. His diarrhea has resolved.   Assessment & Plan:   Principal Problem:   Renal failure (ARF), acute on chronic - may be prerenal due to diarrhea but has severe CKD as well- fistula was discussed with him 3 yrs ago but he never followed up - nephrology has been consulted - holding Losartan/ HCTZ - ADDENDUM: Nephrology has evaluated the patient and would like him to be transferred to [facility] to initiate dialysis as soon as possible. Bed has been requested.   Active Problems: Anemia - Hemoccult negative initially but second set is + --  will need GI eval - I have consulted GI - anemia panel shows low Ferritin- will give 1 U PRBC and an Iron infusion with Rolly Salter - will add Aranesp as well due to anemia likely being secondary to CKD  Diabetes mellitus with renal complications  -cont SSI- Glucotrol on hold- check A1c  Hypokalemia - replace  Mild troponin elevation/ abnormal EKG - T wave inversions in V 2- V5 with flat T in V6 - no complaints of chest pain but he is diabetic- will check 2 more sets of Troponin   Hypertension - Clonidine, Diltiazem, Labetalol - hold Losartan/ HCTZ  Diarrhea - per patient this  has been going on for about 4-5 days but is resolving  - abdominal exam is unrevaling - C diff and GI pathogen panel ordered in ER- follow   DVT prophylaxis: heparin Code Status: Full code Family Communication:  Disposition Plan: home in 2-3 days Consultants:   Nephrology Procedures:    Antimicrobials:  Anti-infectives    None       Objective: Vitals:   03/25/16 0629 03/25/16 0840 03/25/16 0911 03/25/16 1116  BP: 121/69 137/68 127/70 133/71  Pulse: 70 73 63 64  Resp: 16 16 18 16   Temp: 99.3 F (37.4 C) 98.9 F (37.2 C) 99 F (37.2 C) 98.9 F (37.2 C)  TempSrc: Oral Oral Oral Oral  SpO2: 100% 100% 100% 100%  Weight:      Height:        Intake/Output Summary (Last 24 hours) at 03/25/16 1127 Last data filed at 03/25/16 1127  Gross per 24 hour  Intake          1873.33 ml  Output             1125 ml  Net           748.33 ml   Filed Weights   03/24/16 2300  Weight: 73.1 kg (161 lb 3.2 oz)    Examination: General exam: Appears comfortable  HEENT: PERRLA, oral mucosa moist, no sclera  icterus or thrush Respiratory system: Clear to auscultation. Respiratory effort normal. Cardiovascular system: S1 & S2 heard, RRR.  No murmurs  Gastrointestinal system: Abdomen soft, non-tender, nondistended. Normal bowel sound. No organomegaly Central nervous system: Alert and oriented. No focal neurological deficits. Extremities: No cyanosis, clubbing or edema Skin: No rashes or ulcers Psychiatry:  Mood & affect appropriate.     Data Reviewed: I have personally reviewed following labs and imaging studies  CBC:  Recent Labs Lab 03/24/16 1824 03/24/16 1850 03/25/16 0439  WBC  --  7.4 4.7  NEUTROABS  --  5.8 2.7  HGB 8.2* 8.4* 6.9*  HCT 24.0* 24.7* 20.5*  MCV  --  73.5* 73.5*  PLT  --  214 99991111   Basic Metabolic Panel:  Recent Labs Lab 03/24/16 1824 03/24/16 1850 03/25/16 0439  NA 139 136 136  K 4.2 4.0 3.3*  CL 101 103 104  CO2  --  21* 20*  GLUCOSE 237*  257* 89  BUN 79* 86* 82*  CREATININE 7.50* 7.46* 7.08*  CALCIUM  --  7.6* 7.3*   GFR: Estimated Creatinine Clearance: 9.9 mL/min (by C-G formula based on SCr of 7.08 mg/dL). Liver Function Tests:  Recent Labs Lab 03/24/16 1850 03/25/16 0439  AST 21 16  ALT 12* 10*  ALKPHOS 132* 103  BILITOT 0.3 0.5  PROT 7.1 5.9*  ALBUMIN 3.8 3.1*   No results for input(s): LIPASE, AMYLASE in the last 168 hours. No results for input(s): AMMONIA in the last 168 hours. Coagulation Profile: No results for input(s): INR, PROTIME in the last 168 hours. Cardiac Enzymes:  Recent Labs Lab 03/25/16 0727  TROPONINI 0.03*   BNP (last 3 results) No results for input(s): PROBNP in the last 8760 hours. HbA1C: No results for input(s): HGBA1C in the last 72 hours. CBG:  Recent Labs Lab 03/24/16 1817 03/24/16 2256 03/25/16 0756  GLUCAP 254* 217* 181*   Lipid Profile: No results for input(s): CHOL, HDL, LDLCALC, TRIG, CHOLHDL, LDLDIRECT in the last 72 hours. Thyroid Function Tests: No results for input(s): TSH, T4TOTAL, FREET4, T3FREE, THYROIDAB in the last 72 hours. Anemia Panel:  Recent Labs  03/25/16 0727 03/25/16 0737  VITAMINB12  --  667  FOLATE 14.7  --   FERRITIN  --  11*  TIBC  --  344  IRON  --  43*  RETICCTPCT 1.0  --    Urine analysis:    Component Value Date/Time   COLORURINE YELLOW 03/24/2016 1951   APPEARANCEUR CLEAR 03/24/2016 1951   LABSPEC 1.013 03/24/2016 1951   PHURINE 5.5 03/24/2016 1951   GLUCOSEU 250 (A) 03/24/2016 1951   HGBUR NEGATIVE 03/24/2016 1951   BILIRUBINUR NEGATIVE 03/24/2016 1951   KETONESUR NEGATIVE 03/24/2016 1951   PROTEINUR NEGATIVE 03/24/2016 1951   UROBILINOGEN 0.2 08/01/2012 1833   NITRITE NEGATIVE 03/24/2016 1951   LEUKOCYTESUR NEGATIVE 03/24/2016 1951   Sepsis Labs: @LABRCNTIP (procalcitonin:4,lacticidven:4) )No results found for this or any previous visit (from the past 240 hour(s)).       Radiology Studies: Dg Chest Port 1  View  Result Date: 03/25/2016 CLINICAL DATA:  70 year old male with a history of dizziness EXAM: PORTABLE CHEST 1 VIEW COMPARISON:  08/06/2012 FINDINGS: Compare to the prior plain film there is increased widening of the mediastinum at the level of the vascular pedicle, with double density of the contours along the right mediastinal border, compatible with prominent right aspect of the ascending aorta. Calcifications of the aortic arch. No central vascular congestion. No pneumothorax or  pleural effusion. No confluent airspace disease. No displaced fracture. IMPRESSION: No acute cardiopulmonary disease. Compare to the prior x-ray of 08/06/2012 there is increased prominence of the mediastinal width, with either tortuosity of the ascending aorta or developing aneurysm/ectasia. Further evaluation with CTA chest recommended. These results will be called to the ordering clinician or representative by the Radiologist Assistant, and communication documented in the PACS or zVision Dashboard. Signed, Dulcy Fanny. Earleen Newport, DO [facility] Radiology Electronically Signed   By: Corrie Mckusick D.O.   On: 03/25/2016 08:08      Scheduled Meds: . amLODipine  10 mg Oral Daily  . calcitRIOL  0.25 mcg Oral Daily  . cloNIDine  0.2 mg Oral BID  . diltiazem  240 mg Oral Daily  . feeding supplement (NEPRO CARB STEADY)  237 mL Oral BID BM  . insulin aspart  0-9 Units Subcutaneous TID WC  . labetalol  200 mg Oral BID   Continuous Infusions: . sodium chloride 100 mL/hr at 03/25/16 0828     LOS: 0 days    Time spent in minutes: 54    [place], MD [facility] Pager: [email or web address] Password [facility] 03/25/2016, 11:27 AM

## 2016-03-25 NOTE — Progress Notes (Signed)
Initial Nutrition Assessment  DOCUMENTATION CODES:   Not applicable  INTERVENTION:  - Continue Nepro Shake BID, each supplement provides 425 kcal and 19 grams protein - Continue to encourage PO intakes of meals and supplements.  - RD will follow-up for diet education prior to d/c.  NUTRITION DIAGNOSIS:   Food and nutrition related knowledge deficit related to other (see comment) (prior eating habits) as evidenced by per patient/family report.  GOAL:   Patient will meet greater than or equal to 90% of their needs  MONITOR:   PO intake, Supplement acceptance, Weight trends, Labs, I & O's  REASON FOR ASSESSMENT:   Malnutrition Screening Tool  ASSESSMENT:   70 y.o. male with chronic kidney disease stage IV, hypertension, diabetes mellitus was brought to the ER from [place] which he was visiting today. Patient states around noon time he felt week and almost passed out. He has not eaten well today and thought he may have had low blood sugar. But blood sugar was in the 200s. Patient states over the last 2 weeks he has been feeling fatigue and last 2 days he has been having multiple episodes of diarrhea. Denies any chest pain or shortness of breath. Patient denies any recent use of antibiotics or sick contacts. In the ER patient's creatinine is found to be 7. Last creatinine in our system was around 4.7 in 2014. Patient was admitted for acute on chronic renal failure probably from dehydration and was given fluid bolus. Hemoglobin is around 8 which is a drop from 12 in 2014. Patient states he has not followed up with his nephrologist since last 2 years. 2 years previously patient was advised to have AV fistula which he has not followed up with.  Pt seen for MST. BMI indicates normal weight. No intakes documented since admission; pt reports he ate breakfast this AM and RD visualized lunch tray with ~50% completion at time of visit. Pt denies any discomfort or nausea with eating. He states that he  has made several dietary changes PTA such as cutting out pork and sweet d/t hx of HTN and DM. Pt states he feels he has a good understanding of foods that increase his blood pressure and blood sugars. Pt does request diet education prior to d/c. Pt states that he drank 1 can of Nepro this AM and preparing to drink another after RD visit. Pt reports that he likes this supplement.   Pt states that he usually eats 3 meals/day but that in the few days PTA he was mainly grazing throughout the day rather than eating set meals. Pt states that d/t change in foods he was eating, he has lost weight from UBW of 200 lbs over the past 6-7 months. Based on CBW, this would indicate 39 lbs weight loss (19.5% body weight) in this time frame which is significant. No weight in chart since 04/25/14 with weight of 163 lbs at that time (consistent with CBW) and pt weighed 145-148 lbs in 2014. Question if some of his weight gain and reported subsequent weight loss were fluid related with hx of CKD and report of HTN. Will continue to monitor weight trends during admission. Unable to complete physical assessment during this visit but will complete at follow-up and document findings at that time.  Medications reviewed; 0.25 mcg Rocaltrol/day, 510 mg IV Feraheme x1 dose today, sliding scale Novolog, PRN Zofran, 40 mEq oral KCl x1 dose today.   Labs reviewed; CBGs: 166 and 181 mg/dL today, K: 3.3 mmol/L, BUN:  82 mg/dL, creatinine: 7.08 mg/dL, Ca: 7.3 mg/dL, GFR: 8 mL/min. IVF: NS @ 100 mL/hr.   Diet Order:  Diet renal/carb modified with fluid restriction Diet-HS Snack? Nothing; Room service appropriate? Yes; Fluid consistency: Thin  Skin:  Reviewed, no issues  Last BM:  9/10  Height:   Ht Readings from Last 1 Encounters:  03/24/16 5\' 11"  (1.803 m)    Weight:   Wt Readings from Last 1 Encounters:  03/24/16 161 lb 3.2 oz (73.1 kg)    Ideal Body Weight:  78.18 kg  BMI:  Body mass index is 22.48 kg/m.  Estimated  Nutritional Needs:   Kcal:  1550-1750  Protein:  70-80 grams  Fluid:  1.5 L/day  EDUCATION NEEDS:   No education needs identified at this time    Jarome Matin, MS, RD, LDN Inpatient Clinical Dietitian Pager # [phone] After hours/weekend pager # [phone]

## 2016-03-25 NOTE — Care Management Note (Signed)
Case Management Note  Patient Details  Name: Tim Armstrong MRN: [id number] Date of Birth: May 20, 1946  Subjective/Objective: 70 y/o m admitted w/ARF. From home.                   Action/Plan:d/c plan home.   Expected Discharge Date:                  Expected Discharge Plan:  Home/Self Care  In-House Referral:     Discharge planning Services  CM Consult  Post Acute Care Choice:    Choice offered to:     DME Arranged:    DME Agency:     HH Arranged:    HH Agency:     Status of Service:  In process, will continue to follow  If discussed at Long Length of Stay Meetings, dates discussed:    Additional Comments:  Dessa Phi, RN 03/25/2016, 1:41 PM

## 2016-03-25 NOTE — Consult Note (Signed)
Asked by Dr Jonnie Finner to eval pt for hemodialysis access.  Pt is left handed.  Will do full consult when pt arrives at [facility].  Ruta Hinds, MD [facility] [place] Office: [phone] Pager: [phone]

## 2016-03-25 NOTE — Progress Notes (Signed)
MD Rizwan paged to be made aware of Troponin 0.06 and Positive fecal occult stool result. Awaiting response.

## 2016-03-26 ENCOUNTER — Encounter (HOSPITAL_COMMUNITY): Payer: Medicare Other

## 2016-03-26 ENCOUNTER — Inpatient Hospital Stay (HOSPITAL_COMMUNITY): Payer: Medicare HMO

## 2016-03-26 DIAGNOSIS — N179 Acute kidney failure, unspecified: Principal | ICD-10-CM

## 2016-03-26 DIAGNOSIS — R42 Dizziness and giddiness: Secondary | ICD-10-CM

## 2016-03-26 DIAGNOSIS — N189 Chronic kidney disease, unspecified: Secondary | ICD-10-CM

## 2016-03-26 DIAGNOSIS — Z01810 Encounter for preprocedural cardiovascular examination: Secondary | ICD-10-CM

## 2016-03-26 DIAGNOSIS — I10 Essential (primary) hypertension: Secondary | ICD-10-CM

## 2016-03-26 DIAGNOSIS — Z0189 Encounter for other specified special examinations: Secondary | ICD-10-CM

## 2016-03-26 DIAGNOSIS — R55 Syncope and collapse: Secondary | ICD-10-CM | POA: Diagnosis present

## 2016-03-26 LAB — GLUCOSE, CAPILLARY
GLUCOSE-CAPILLARY: 129 mg/dL — AB (ref 65–99)
GLUCOSE-CAPILLARY: 192 mg/dL — AB (ref 65–99)
GLUCOSE-CAPILLARY: 161 mg/dL — AB (ref 65–99)
GLUCOSE-CAPILLARY: 90 mg/dL (ref 65–99)

## 2016-03-26 LAB — TYPE AND SCREEN
ABO/RH(D): B POS
ABO/RH(D): B POS
Antibody Screen: NEGATIVE
Antibody Screen: NEGATIVE
Unit division: 0

## 2016-03-26 LAB — ABO/RH: ABO/RH(D): B POS

## 2016-03-26 MED ORDER — DEXTROSE 5 % IV SOLN
1.5000 g | INTRAVENOUS | Status: AC
  Administered 2016-03-27: 1.5 g via INTRAVENOUS
  Filled 2016-03-26 (×2): qty 1.5

## 2016-03-26 MED ORDER — CLONIDINE HCL 0.1 MG PO TABS
0.1000 mg | ORAL_TABLET | Freq: Two times a day (BID) | ORAL | Status: DC
  Administered 2016-03-26 – 2016-04-02 (×12): 0.1 mg via ORAL
  Filled 2016-03-26 (×12): qty 1

## 2016-03-26 MED ORDER — IOPAMIDOL (ISOVUE-300) INJECTION 61%
15.0000 mL | INTRAVENOUS | Status: AC
  Administered 2016-03-26 (×2): 15 mL via ORAL

## 2016-03-26 MED ORDER — SODIUM CHLORIDE 0.9 % IV SOLN
125.0000 mg | INTRAVENOUS | Status: DC
  Filled 2016-03-26 (×2): qty 10

## 2016-03-26 NOTE — Consult Note (Signed)
[facility] [place]  Subjective  - no complaints   Objective 135/65 66 97.9 F (36.6 C) (Oral) 18 100%  Intake/Output Summary (Last 24 hours) at 03/26/16 1121 Last data filed at 03/26/16 0900  Gross per 24 hour  Intake             1900 ml  Output             1800 ml  Net              100 ml    Assessment/Planning: Vein map pending but most likely right arm AV graft and tunneled catheter tomorrow Discussed with pt  Consent NPO p midnight Cefuroxime on call  Ruta Hinds 03/26/2016 11:21 AM --  Laboratory Lab Results:  Recent Labs  03/24/16 1850 03/25/16 0439 03/25/16 1331  WBC 7.4 4.7  --   HGB 8.4* 6.9* 8.2*  HCT 24.7* 20.5* 24.2*  PLT 214 190  --    BMET  Recent Labs  03/24/16 1850 03/25/16 0439  NA 136 136  K 4.0 3.3*  CL 103 104  CO2 21* 20*  GLUCOSE 257* 89  BUN 86* 82*  CREATININE 7.46* 7.08*  CALCIUM 7.6* 7.3*    COAG No results found for: INR, PROTIME No results found for: PTT

## 2016-03-26 NOTE — Progress Notes (Signed)
Tim Armstrong 10:44 AM  Subjective: Patient doing well without any new complaints and is scheduled tomorrow for his dialysis catheter  Objective: Vital signs stable afebrile no acute distress abdomen is soft nontender hemoglobin increased  Assessment: Anemia weight loss question will etiology  Plan: Will begin workup with a oral contrast only CT and then decide the timing of further workup and plans pending those findings  [facility]  Pager [phone] After 5PM or if no answer call [phone]
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
## 2016-03-26 NOTE — Progress Notes (Addendum)
[facility]  PROGRESS NOTE  Tim Armstrong [id number] DOB: [birth year] DOA: 03/24/2016 PCP: Philis Fendt, MD    Brief HPI:   70 y.o.malewith chronic kidney disease stage IV, hypertension, diabetes mellitus was brought to the ER from [place] he became lightheaded and near-syncopal. Blood sugar was in the 200s. Patient states over the last 2 weeks he hasbeen feeling fatigue and last 2 days he has been having multiple episodes of diarrhea.     In the ER patient's creatinine is found to be 7. Last creatinine in our system was around 4.7 in 2014 and at that time, he was recommended to have a fistula placed. He was lost to follow up. He was found also to have a Hb of 8 which has dropped to 6.9 with IVF given in ER.    Assessment/Plan:    1. CKD stage V-patient seen by nephrology, to initiating dialysis in a.m. Vascular surgery has been consulted for tunneled catheter and AV graft in a.m. 2. Anemia with blood positive stool- gastroenterology has been consulted and plan is to do CT of the abdomen and pelvis with oral contrast, as patient has 40 pound weight loss history. Hemoglobin is 8.2 after blood transfusion. Will check CBC in a.m. 3. Diabetes mellitus-hold Glucotrol,  glucose is well controlled, continue sliding scale insulin with NovoLog. 4. Hypertension-blood pressure is controlled, continue labetalol, Cardizem. 5. Diarrhea- stool for C. difficile is negative, we'll start Imodium when necessary for diarrhea 6. Syncope- likely from diarrhea, but patient has had 3 episodes of syncope in the recent past. Will check 2-D echo, monitor on telemetry 7. Elevated troponin- patient had mild elevation of troponin 0.06, which came down to 0.03, no chest pain. EKG showed nonspecific T-wave abnormality and T-wave inversions in lead V2 to V5 Will check 2-D echo.   DVT prophylaxis: Heparin Code Status: Full code Family Communication: No family at bedside Disposition Plan: Likely home  after AV graft and tunneled catheter    Consultants:  Vascular surgery  Nephrology  GI  Procedures:  None   Antibiotics: Anti-infectives    Start     Dose/Rate Route Frequency Ordered Stop   03/27/16 0600  cefUROXime (ZINACEF) 1.5 g in dextrose 5 % 50 mL IVPB     1.5 g 100 mL/hr over 30 Minutes Intravenous To [facility] 03/26/16 1003 03/28/16 0600       Subjective   Patient seen and examined, denies chest pain or shortness of breath.He told me that he had 3 episodes of syncope over the past few months. And recent episode made him come to the hospital for further evaluation  Objective    Objective: Vitals:   03/25/16 1334 03/25/16 2025 03/26/16 0516 03/26/16 1000  BP: 120/68 130/66 117/62 135/65  Pulse: 69 65 67 66  Resp: 18 18 18 18   Temp: 99.1 F (37.3 C) 98.6 F (37 C) 98.8 F (37.1 C) 97.9 F (36.6 C)  TempSrc: Oral Oral Oral Oral  SpO2: 100% 99% 100% 100%  Weight:  74.5 kg (164 lb 3.9 oz)    Height:        Intake/Output Summary (Last 24 hours) at 03/26/16 1336 Last data filed at 03/26/16 1100  Gross per 24 hour  Intake             2140 ml  Output             1500 ml  Net  640 ml   Filed Weights   03/24/16 2300 03/25/16 2025  Weight: 73.1 kg (161 lb 3.2 oz) 74.5 kg (164 lb 3.9 oz)    Examination:  General exam: Appears calm and comfortable  Respiratory system: Clear to auscultation. Respiratory effort normal. Cardiovascular system: S1 & S2 heard, RRR. No JVD, murmurs, rubs, gallops or clicks. No pedal edema. Gastrointestinal system: Abdomen is nondistended, soft and nontender. No organomegaly or masses felt. Normal bowel sounds heard. Central nervous system: Alert and oriented. No focal neurological deficits. Extremities: Symmetric 5 x 5 power. Skin: No rashes, lesions or ulcers Psychiatry: Judgement and insight appear normal. Mood & affect appropriate.    Data Reviewed: I have personally reviewed following labs and  imaging studies Basic Metabolic Panel:  Recent Labs Lab 03/24/16 1824 03/24/16 1850 03/25/16 0439  NA 139 136 136  K 4.2 4.0 3.3*  CL 101 103 104  CO2  --  21* 20*  GLUCOSE 237* 257* 89  BUN 79* 86* 82*  CREATININE 7.50* 7.46* 7.08*  CALCIUM  --  7.6* 7.3*   Liver Function Tests:  Recent Labs Lab 03/24/16 1850 03/25/16 0439  AST 21 16  ALT 12* 10*  ALKPHOS 132* 103  BILITOT 0.3 0.5  PROT 7.1 5.9*  ALBUMIN 3.8 3.1*   No results for input(s): LIPASE, AMYLASE in the last 168 hours. No results for input(s): AMMONIA in the last 168 hours. CBC:  Recent Labs Lab 03/24/16 1824 03/24/16 1850 03/25/16 0439 03/25/16 1331  WBC  --  7.4 4.7  --   NEUTROABS  --  5.8 2.7  --   HGB 8.2* 8.4* 6.9* 8.2*  HCT 24.0* 24.7* 20.5* 24.2*  MCV  --  73.5* 73.5*  --   PLT  --  214 190  --    Cardiac Enzymes:  Recent Labs Lab 03/25/16 0727 03/25/16 1331 03/25/16 1857  TROPONINI 0.03* 0.06* 0.03*   BNP (last 3 results) No results for input(s): BNP in the last 8760 hours.  ProBNP (last 3 results) No results for input(s): PROBNP in the last 8760 hours.  CBG:  Recent Labs Lab 03/25/16 1138 03/25/16 1649 03/25/16 2023 03/26/16 0756 03/26/16 1146  GLUCAP 166* 89 160* 129* 192*    Recent Results (from the past 240 hour(s))  C difficile quick scan w PCR reflex     Status: None   Collection Time: 03/24/16 10:02 PM  Result Value Ref Range Status   C Diff antigen NEGATIVE NEGATIVE Final   C Diff toxin NEGATIVE NEGATIVE Final   C Diff interpretation No C. difficile detected.  Final  Gastrointestinal Panel by PCR , Stool     Status: None   Collection Time: 03/24/16 10:02 PM  Result Value Ref Range Status   Campylobacter species NOT DETECTED NOT DETECTED Final   Plesimonas shigelloides NOT DETECTED NOT DETECTED Final   Salmonella species NOT DETECTED NOT DETECTED Final   Yersinia enterocolitica NOT DETECTED NOT DETECTED Final   Vibrio species NOT DETECTED NOT DETECTED  Final   Vibrio cholerae NOT DETECTED NOT DETECTED Final   Enteroaggregative E coli (EAEC) NOT DETECTED NOT DETECTED Final   Enteropathogenic E coli (EPEC) NOT DETECTED NOT DETECTED Final   Enterotoxigenic E coli (ETEC) NOT DETECTED NOT DETECTED Final   Shiga like toxin producing E coli (STEC) NOT DETECTED NOT DETECTED Final   E. coli O157 NOT DETECTED NOT DETECTED Final   Shigella/Enteroinvasive E coli (EIEC) NOT DETECTED NOT DETECTED Final   Cryptosporidium NOT DETECTED  NOT DETECTED Final   Cyclospora cayetanensis NOT DETECTED NOT DETECTED Final   Entamoeba histolytica NOT DETECTED NOT DETECTED Final   Giardia lamblia NOT DETECTED NOT DETECTED Final   Adenovirus F40/41 NOT DETECTED NOT DETECTED Final   Astrovirus NOT DETECTED NOT DETECTED Final   Norovirus GI/GII NOT DETECTED NOT DETECTED Final   Rotavirus A NOT DETECTED NOT DETECTED Final   Sapovirus (I, II, IV, and V) NOT DETECTED NOT DETECTED Final     Studies: Dg Chest Port 1 View  Result Date: 03/25/2016 CLINICAL DATA:  70 year old male with a history of dizziness EXAM: PORTABLE CHEST 1 VIEW COMPARISON:  08/06/2012 FINDINGS: Compare to the prior plain film there is increased widening of the mediastinum at the level of the vascular pedicle, with double density of the contours along the right mediastinal border, compatible with prominent right aspect of the ascending aorta. Calcifications of the aortic arch. No central vascular congestion. No pneumothorax or pleural effusion. No confluent airspace disease. No displaced fracture. IMPRESSION: No acute cardiopulmonary disease. Compare to the prior x-ray of 08/06/2012 there is increased prominence of the mediastinal width, with either tortuosity of the ascending aorta or developing aneurysm/ectasia. Further evaluation with CTA chest recommended. These results will be called to the ordering clinician or representative by the Radiologist Assistant, and communication documented in the PACS or  zVision Dashboard. Signed, Dulcy Fanny. Earleen Newport, DO [facility] Electronically Signed   By: Corrie Mckusick D.O.   On: 03/25/2016 08:08    Scheduled Meds: . calcitRIOL  0.25 mcg Oral Daily  . [START ON 03/27/2016] cefUROXime (ZINACEF)  IV  1.5 g Intravenous To SS-Surg  . cloNIDine  0.1 mg Oral BID  . darbepoetin (ARANESP) injection - DIALYSIS  40 mcg Intravenous Q Mon-HD  . diltiazem  240 mg Oral Daily  . feeding supplement (NEPRO CARB STEADY)  237 mL Oral BID BM  . [START ON 03/29/2016] ferric gluconate (FERRLECIT/NULECIT) IV  125 mg Intravenous Q M,W,F-HD  . heparin subcutaneous  5,000 Units Subcutaneous Q8H  . insulin aspart  0-9 Units Subcutaneous TID WC  . labetalol  200 mg Oral BID   Continuous Infusions:      Time spent: 25 min    [facility] Pager [phone]. If 7PM-7AM, please contact night-coverage at [email or web address], Office  [phone]  password [email or web address] 03/26/2016, 1:36 PM  LOS: 1 day

## 2016-03-26 NOTE — Progress Notes (Signed)
[facility] Progress Note   Subjective:  "I'm feeling OK. I didn't want to start dialysis but after I started passing out, that convinced me."  Initially seen by Dr. Florene Glen, didn't follow up. "He said I'd be back". Base line Scr 4.48, now 7. Admits to feeling tired pre admission and likely uremic with weight loss  Objective Vitals:   03/25/16 1334 03/25/16 2025 03/26/16 0516 03/26/16 1000  BP: 120/68 130/66 117/62 135/65  Pulse: 69 65 67 66  Resp: 18 18 18 18   Temp: 99.1 F (37.3 C) 98.6 F (37 C) 98.8 F (37.1 C) 97.9 F (36.6 C)  TempSrc: Oral Oral Oral Oral  SpO2: 100% 99% 100% 100%  Weight:  74.5 kg (164 lb 3.9 oz)    Height:       Physical Exam General: Pleasant, NAD Heart: S1,S2, RRR Lungs: BBS CTA A/P Abdomen: Soft, non-tender, active BS Extremities: No LE edema, pedal pulses intact.  Dialysis Access: going to perm access/TDC tomorrow    Additional Objective Labs: Basic Metabolic Panel:  Recent Labs Lab 03/24/16 1824 03/24/16 1850 03/25/16 0439  NA 139 136 136  K 4.2 4.0 3.3*  CL 101 103 104  CO2  --  21* 20*  GLUCOSE 237* 257* 89  BUN 79* 86* 82*  CREATININE 7.50* 7.46* 7.08*  CALCIUM  --  7.6* 7.3*   Liver Function Tests:  Recent Labs Lab 03/24/16 1850 03/25/16 0439  AST 21 16  ALT 12* 10*  ALKPHOS 132* 103  BILITOT 0.3 0.5  PROT 7.1 5.9*  ALBUMIN 3.8 3.1*   CBC:  Recent Labs Lab 03/24/16 1850 03/25/16 0439 03/25/16 1331  WBC 7.4 4.7  --   NEUTROABS 5.8 2.7  --   HGB 8.4* 6.9* 8.2*  HCT 24.7* 20.5* 24.2*  MCV 73.5* 73.5*  --   PLT 214 190  --    Blood Culture    Component Value Date/Time   SDES BLOOD HAND RIGHT 08/02/2012 0840   SPECREQUEST BOTTLES DRAWN AEROBIC AND ANAEROBIC 10CC EACH 08/02/2012 0840   CULT NO GROWTH 5 DAYS 08/02/2012 0840   REPTSTATUS 08/08/2012 FINAL 08/02/2012 0840    Cardiac Enzymes:  Recent Labs Lab 03/25/16 0727 03/25/16 1331 03/25/16 1857  TROPONINI 0.03* 0.06* 0.03*    CBG:  Recent Labs Lab 03/25/16 0756 03/25/16 1138 03/25/16 1649 03/25/16 2023 03/26/16 0756  GLUCAP 181* 166* 89 160* 129*   Iron Studies:  Recent Labs  03/25/16 0737  IRON 43*  TIBC 344  FERRITIN 11*   @lablastinr3 @ Studies/Results: Dg Chest Port 1 View  Result Date: 03/25/2016 CLINICAL DATA:  70 year old male with a history of dizziness EXAM: PORTABLE CHEST 1 VIEW COMPARISON:  08/06/2012 FINDINGS: Compare to the prior plain film there is increased widening of the mediastinum at the level of the vascular pedicle, with double density of the contours along the right mediastinal border, compatible with prominent right aspect of the ascending aorta. Calcifications of the aortic arch. No central vascular congestion. No pneumothorax or pleural effusion. No confluent airspace disease. No displaced fracture. IMPRESSION: No acute cardiopulmonary disease. Compare to the prior x-ray of 08/06/2012 there is increased prominence of the mediastinal width, with either tortuosity of the ascending aorta or developing aneurysm/ectasia. Further evaluation with CTA chest recommended. These results will be called to the ordering clinician or representative by the Radiologist Assistant, and communication documented in the PACS or zVision Dashboard. Signed, Dulcy Fanny. Earleen Newport, DO [facility] Radiology Electronically Signed  By: Corrie Mckusick D.O.   On: 03/25/2016 08:08   Medications:   . calcitRIOL  0.25 mcg Oral Daily  . [START ON 03/27/2016] cefUROXime (ZINACEF)  IV  1.5 g Intravenous To SS-Surg  . cloNIDine  0.2 mg Oral BID  . darbepoetin (ARANESP) injection - DIALYSIS  40 mcg Intravenous Q Mon-HD  . diltiazem  240 mg Oral Daily  . feeding supplement (NEPRO CARB STEADY)  237 mL Oral BID BM  . [START ON 03/27/2016] ferric gluconate (FERRLECIT/NULECIT) IV  125 mg Intravenous Q M,W,F-HD  . heparin subcutaneous  5,000 Units Subcutaneous Q8H  . insulin  aspart  0-9 Units Subcutaneous TID WC  . labetalol  200 mg Oral BID     Assessment/Plan: 1. CKDstage V - uremic, lost to f/u , last visit 3 yrs ago.  Lost 40 lbs.  Ready to start dialysis. Will have TDC and perm access placed tomorrow, 1st HD when [facility] placed. Start CLIP process to [facility]. Scr 7.08 BUN 82 Alk Phos 103.  I talked to him about possible home dialysis options and maybe transplant- although age may be a stumbling block 2. HTN/Volume - on 6 bp medications- clonidine/cardizem and labetalol, BP controlled at present. No edema. Will have HD tomorrow, run even. No heparin. Probably will be able to wean BP meds as HD gets started- will start with clonidine 3. Anemia of CKD/ also guiac + - transfused, low tsat will start IV Fe and ESA. GI following. HGB down to 6.9 rec'd 1 unit PRBCs, repeat HGB 8.2  03/25/16. Check CBC with HD tomorrow.  4. CKD BMD: Ca 7.3 C Ca 8. Use 2.5 Ca bath check renal panel with HD tomorrow. On daily calcitriol. Increase dose and change to dialysis days after he starts HD. Check phos and PTH tomorrow 5. 40 lb wt loss: probably D/T uremia however GI following.   6. Nutrition: Albumin 3.2. Renal/Carb mod diet.  7. DM: per primary.  Rita H. Brown NP-C 03/26/2016, 10:54 AM  [facility] [phone]  Patient seen and examined, agree with above note with above modifications. Pt resigned to starting HD- for PC and avf tomorrow followed by first HD- check phos and PTH and treat as needed- started iron and ESA- make arrangements for OP HD - wean BP meds  Corliss Parish, MD 03/26/2016

## 2016-03-27 ENCOUNTER — Inpatient Hospital Stay (HOSPITAL_COMMUNITY): Payer: Medicare HMO | Admitting: Anesthesiology

## 2016-03-27 ENCOUNTER — Encounter (HOSPITAL_COMMUNITY)
Admission: EM | Disposition: A | Discharge status: Home Health | Payer: Self-pay | Source: Home / Self Care | Attending: Internal Medicine

## 2016-03-27 ENCOUNTER — Inpatient Hospital Stay (HOSPITAL_COMMUNITY): Payer: Medicare HMO

## 2016-03-27 ENCOUNTER — Encounter (HOSPITAL_COMMUNITY): Payer: Self-pay | Admitting: Anesthesiology

## 2016-03-27 DIAGNOSIS — E0821 Diabetes mellitus due to underlying condition with diabetic nephropathy: Secondary | ICD-10-CM

## 2016-03-27 DIAGNOSIS — N185 Chronic kidney disease, stage 5: Secondary | ICD-10-CM

## 2016-03-27 HISTORY — PX: INSERTION OF DIALYSIS CATHETER: SHX1324

## 2016-03-27 HISTORY — PX: AV FISTULA PLACEMENT: SHX1204

## 2016-03-27 LAB — RENAL FUNCTION PANEL
Albumin: 2.9 g/dL — ABNORMAL LOW (ref 3.5–5.0)
Anion gap: 9 (ref 5–15)
BUN: 71 mg/dL — ABNORMAL HIGH (ref 6–20)
CO2: 23 mmol/L (ref 22–32)
Calcium: 8.2 mg/dL — ABNORMAL LOW (ref 8.9–10.3)
Chloride: 106 mmol/L (ref 101–111)
Creatinine, Ser: 6.59 mg/dL — ABNORMAL HIGH (ref 0.61–1.24)
GFR calc Af Amer: 9 mL/min — ABNORMAL LOW (ref >60)
GFR calc non Af Amer: 8 mL/min — ABNORMAL LOW (ref >60)
Glucose, Bld: 133 mg/dL — ABNORMAL HIGH (ref 65–99)
Phosphorus: 3.8 mg/dL (ref 2.5–4.6)
Potassium: 3.8 mmol/L (ref 3.5–5.1)
Sodium: 138 mmol/L (ref 135–145)

## 2016-03-27 LAB — BASIC METABOLIC PANEL
Anion gap: 8 (ref 5–15)
BUN: 77 mg/dL — AB (ref 6–20)
CALCIUM: 8 mg/dL — AB (ref 8.9–10.3)
CHLORIDE: 106 mmol/L (ref 101–111)
CO2: 22 mmol/L (ref 22–32)
CREATININE: 6.89 mg/dL — AB (ref 0.61–1.24)
GFR, EST AFRICAN AMERICAN: 8 mL/min — AB (ref >60)
GFR, EST NON AFRICAN AMERICAN: 7 mL/min — AB (ref >60)
Glucose, Bld: 119 mg/dL — ABNORMAL HIGH (ref 65–99)
Potassium: 3.5 mmol/L (ref 3.5–5.1)
SODIUM: 136 mmol/L (ref 135–145)

## 2016-03-27 LAB — CBC
HCT: 24.3 % — ABNORMAL LOW (ref 39.0–52.0)
HCT: 25.9 % — ABNORMAL LOW (ref 39.0–52.0)
Hemoglobin: 8 g/dL — ABNORMAL LOW (ref 13.0–17.0)
Hemoglobin: 8.6 g/dL — ABNORMAL LOW (ref 13.0–17.0)
MCH: 24.8 pg — ABNORMAL LOW (ref 26.0–34.0)
MCH: 25.1 pg — ABNORMAL LOW (ref 26.0–34.0)
MCHC: 32.9 g/dL (ref 30.0–36.0)
MCHC: 33.2 g/dL (ref 30.0–36.0)
MCV: 75.2 fL — ABNORMAL LOW (ref 78.0–100.0)
MCV: 75.5 fL — ABNORMAL LOW (ref 78.0–100.0)
PLATELETS: 171 10*3/uL (ref 150–400)
Platelets: 143 10*3/uL — ABNORMAL LOW (ref 150–400)
RBC: 3.23 MIL/uL — AB (ref 4.22–5.81)
RBC: 3.43 MIL/uL — ABNORMAL LOW (ref 4.22–5.81)
RDW: 16 % — AB (ref 11.5–15.5)
RDW: 16.1 % — ABNORMAL HIGH (ref 11.5–15.5)
WBC: 5.3 10*3/uL (ref 4.0–10.5)
WBC: 7.1 K/uL (ref 4.0–10.5)

## 2016-03-27 LAB — GLUCOSE, CAPILLARY
GLUCOSE-CAPILLARY: 121 mg/dL — AB (ref 65–99)
GLUCOSE-CAPILLARY: 118 mg/dL — AB (ref 65–99)
Glucose-Capillary: 116 mg/dL — ABNORMAL HIGH (ref 65–99)
Glucose-Capillary: 116 mg/dL — ABNORMAL HIGH (ref 65–99)
Glucose-Capillary: 126 mg/dL — ABNORMAL HIGH (ref 65–99)

## 2016-03-27 LAB — HEPATITIS PANEL, ACUTE
HCV AB: 0.1 {s_co_ratio} (ref 0.0–0.9)
HEP A IGM: NEGATIVE
HEP B C IGM: NEGATIVE
HEP B S AG: NEGATIVE

## 2016-03-27 LAB — PHOSPHORUS: PHOSPHORUS: 4 mg/dL (ref 2.5–4.6)

## 2016-03-27 SURGERY — ARTERIOVENOUS (AV) FISTULA CREATION
Anesthesia: Monitor Anesthesia Care | Site: Neck | Laterality: Right

## 2016-03-27 MED ORDER — VANCOMYCIN HCL 10 G IV SOLR
1500.0000 mg | Freq: Once | INTRAVENOUS | Status: AC
  Administered 2016-03-27: 1500 mg via INTRAVENOUS
  Filled 2016-03-27: qty 1500

## 2016-03-27 MED ORDER — CEFEPIME HCL 1 G IJ SOLR
1.0000 g | INTRAMUSCULAR | Status: DC
  Administered 2016-03-27: 1 g via INTRAVENOUS
  Filled 2016-03-27 (×2): qty 1

## 2016-03-27 MED ORDER — SODIUM CHLORIDE 0.9 % IV SOLN
INTRAVENOUS | Status: DC
  Administered 2016-03-27 (×2): via INTRAVENOUS

## 2016-03-27 MED ORDER — INFLUENZA VAC SPLIT QUAD 0.5 ML IM SUSY: 0.5000 mL | PREFILLED_SYRINGE | INTRAMUSCULAR | Status: DC

## 2016-03-27 MED ORDER — PHENYLEPHRINE HCL 10 MG/ML IJ SOLN
INTRAVENOUS | Status: DC | PRN
  Administered 2016-03-27: 20 ug/min via INTRAVENOUS

## 2016-03-27 MED ORDER — HEPARIN SODIUM (PORCINE) 1000 UNIT/ML IJ SOLN
INTRAMUSCULAR | Status: DC | PRN
  Administered 2016-03-27: 3.4 mL

## 2016-03-27 MED ORDER — LACTATED RINGERS IV SOLN: INTRAVENOUS | Status: DC

## 2016-03-27 MED ORDER — SODIUM CHLORIDE 0.9 % IV SOLN
INTRAVENOUS | Status: DC | PRN
  Administered 2016-03-27: 500 mL

## 2016-03-27 MED ORDER — FENTANYL CITRATE (PF) 100 MCG/2ML IJ SOLN
INTRAMUSCULAR | Status: DC | PRN
  Administered 2016-03-27: 50 ug via INTRAVENOUS
  Administered 2016-03-27: 100 ug via INTRAVENOUS
  Administered 2016-03-27: 50 ug via INTRAVENOUS

## 2016-03-27 MED ORDER — LIDOCAINE HCL (PF) 1 % IJ SOLN
INTRAMUSCULAR | Status: DC | PRN
  Administered 2016-03-27: 30 mL

## 2016-03-27 MED ORDER — PROMETHAZINE HCL 25 MG/ML IJ SOLN: 6.2500 mg | INTRAMUSCULAR | Status: DC | PRN

## 2016-03-27 MED ORDER — FENTANYL CITRATE (PF) 100 MCG/2ML IJ SOLN: 25.0000 ug | INTRAMUSCULAR | Status: DC | PRN

## 2016-03-27 MED ORDER — ONDANSETRON HCL 4 MG/2ML IJ SOLN
INTRAMUSCULAR | Status: DC | PRN
  Administered 2016-03-27: 4 mg via INTRAVENOUS

## 2016-03-27 MED ORDER — 0.9 % SODIUM CHLORIDE (POUR BTL) OPTIME
TOPICAL | Status: DC | PRN
  Administered 2016-03-27: 1000 mL

## 2016-03-27 MED ORDER — LIDOCAINE HCL (PF) 1 % IJ SOLN
INTRAMUSCULAR | Status: AC
  Filled 2016-03-27: qty 30

## 2016-03-27 MED ORDER — SUCCINYLCHOLINE CHLORIDE 20 MG/ML IJ SOLN
INTRAMUSCULAR | Status: DC | PRN
  Administered 2016-03-27: 120 mg via INTRAVENOUS

## 2016-03-27 MED ORDER — LABETALOL HCL 200 MG PO TABS
200.0000 mg | ORAL_TABLET | ORAL | Status: AC
  Administered 2016-03-27: 200 mg via ORAL
  Filled 2016-03-27: qty 1

## 2016-03-27 MED ORDER — LIDOCAINE HCL (CARDIAC) 20 MG/ML IV SOLN
INTRAVENOUS | Status: DC | PRN
  Administered 2016-03-27: 60 mg via INTRATRACHEAL

## 2016-03-27 MED ORDER — HEPARIN SODIUM (PORCINE) 1000 UNIT/ML IJ SOLN
INTRAMUSCULAR | Status: DC | PRN
  Administered 2016-03-27: 3000 [IU] via INTRAVENOUS

## 2016-03-27 MED ORDER — HEPARIN SODIUM (PORCINE) 1000 UNIT/ML DIALYSIS: 20.0000 [IU]/kg | INTRAMUSCULAR | Status: DC | PRN

## 2016-03-27 MED ORDER — FENTANYL CITRATE (PF) 100 MCG/2ML IJ SOLN
INTRAMUSCULAR | Status: AC
  Filled 2016-03-27: qty 2

## 2016-03-27 MED ORDER — PROPOFOL 10 MG/ML IV BOLUS
INTRAVENOUS | Status: DC | PRN
  Administered 2016-03-27: 200 mg via INTRAVENOUS
  Administered 2016-03-27: 100 mg via INTRAVENOUS

## 2016-03-27 MED ORDER — KIDNEY FAILURE BOOK
Freq: Once | Status: AC
  Administered 2016-03-27: 05:00:00
  Filled 2016-03-27: qty 1

## 2016-03-27 MED ORDER — SODIUM CHLORIDE 0.9 % IV SOLN: INTRAVENOUS | Status: DC

## 2016-03-27 MED ORDER — MEPERIDINE HCL 25 MG/ML IJ SOLN: 6.2500 mg | INTRAMUSCULAR | Status: DC | PRN

## 2016-03-27 MED ORDER — HEPARIN SODIUM (PORCINE) 1000 UNIT/ML IJ SOLN
INTRAMUSCULAR | Status: AC
  Filled 2016-03-27: qty 1

## 2016-03-27 MED ORDER — PROPOFOL 10 MG/ML IV BOLUS
INTRAVENOUS | Status: AC
  Filled 2016-03-27: qty 20

## 2016-03-27 MED ORDER — VANCOMYCIN HCL 10 G IV SOLR
1500.0000 mg | Freq: Once | INTRAVENOUS | Status: DC
  Filled 2016-03-27: qty 1500

## 2016-03-27 MED ORDER — IOPAMIDOL (ISOVUE-300) INJECTION 61%
INTRAVENOUS | Status: AC
  Filled 2016-03-27: qty 50

## 2016-03-27 SURGICAL SUPPLY — 59 items
ARMBAND PINK RESTRICT EXTREMIT (MISCELLANEOUS) ×6 IMPLANT
BAG DECANTER FOR FLEXI CONT (MISCELLANEOUS) ×3 IMPLANT
BIOPATCH RED 1 DISK 7.0 (GAUZE/BANDAGES/DRESSINGS) ×3 IMPLANT
CANISTER SUCTION 2500CC (MISCELLANEOUS) ×3 IMPLANT
CANNULA VESSEL 3MM 2 BLNT TIP (CANNULA) ×3 IMPLANT
CATH PALINDROME RT-P 15FX19CM (CATHETERS) IMPLANT
CATH PALINDROME RT-P 15FX23CM (CATHETERS) ×3 IMPLANT
CATH PALINDROME RT-P 15FX28CM (CATHETERS) IMPLANT
CATH PALINDROME RT-P 15FX55CM (CATHETERS) IMPLANT
CATH STRAIGHT 5FR 65CM (CATHETERS) IMPLANT
CHLORAPREP W/TINT 26ML (MISCELLANEOUS) ×3 IMPLANT
CLIP TI MEDIUM 6 (CLIP) ×3 IMPLANT
CLIP TI WIDE RED SMALL 6 (CLIP) ×3 IMPLANT
COVER PROBE W GEL 5X96 (DRAPES) ×6 IMPLANT
COVER SURGICAL LIGHT HANDLE (MISCELLANEOUS) ×3 IMPLANT
DECANTER SPIKE VIAL GLASS SM (MISCELLANEOUS) ×3 IMPLANT
DRAIN PENROSE 1/4X12 LTX STRL (WOUND CARE) ×3 IMPLANT
DRAPE C-ARM 42X72 X-RAY (DRAPES) ×3 IMPLANT
DRAPE CHEST BREAST 15X10 FENES (DRAPES) ×3 IMPLANT
ELECT REM PT RETURN 9FT ADLT (ELECTROSURGICAL) ×3
ELECTRODE REM PT RTRN 9FT ADLT (ELECTROSURGICAL) ×2 IMPLANT
GAUZE SPONGE 2X2 8PLY STRL LF (GAUZE/BANDAGES/DRESSINGS) ×2 IMPLANT
GAUZE SPONGE 4X4 16PLY XRAY LF (GAUZE/BANDAGES/DRESSINGS) ×3 IMPLANT
GLOVE BIO SURGEON STRL SZ 6.5 (GLOVE) ×12 IMPLANT
GLOVE BIO SURGEON STRL SZ7.5 (GLOVE) ×3 IMPLANT
GLOVE BIOGEL PI IND STRL 6.5 (GLOVE) ×12 IMPLANT
GLOVE BIOGEL PI IND STRL 7.5 (GLOVE) ×2 IMPLANT
GLOVE BIOGEL PI INDICATOR 6.5 (GLOVE) ×6
GLOVE BIOGEL PI INDICATOR 7.5 (GLOVE) ×1
GLOVE ECLIPSE 7.0 STRL STRAW (GLOVE) ×3 IMPLANT
GLOVE SURG SS PI 6.5 STRL IVOR (GLOVE) ×3 IMPLANT
GOWN STRL REUS W/ TWL LRG LVL3 (GOWN DISPOSABLE) ×14 IMPLANT
GOWN STRL REUS W/TWL LRG LVL3 (GOWN DISPOSABLE) ×7
KIT BASIN OR (CUSTOM PROCEDURE TRAY) ×3 IMPLANT
KIT ROOM TURNOVER OR (KITS) ×3 IMPLANT
LIQUID BAND (GAUZE/BANDAGES/DRESSINGS) ×6 IMPLANT
LOOP VESSEL MINI RED (MISCELLANEOUS) IMPLANT
NEEDLE 18GX1X1/2 (RX/OR ONLY) (NEEDLE) ×3 IMPLANT
NEEDLE HYPO 25GX1X1/2 BEV (NEEDLE) ×3 IMPLANT
NS IRRIG 1000ML POUR BTL (IV SOLUTION) ×3 IMPLANT
PACK CV ACCESS (CUSTOM PROCEDURE TRAY) ×3 IMPLANT
PACK SURGICAL SETUP 50X90 (CUSTOM PROCEDURE TRAY) ×3 IMPLANT
PAD ARMBOARD 7.5X6 YLW CONV (MISCELLANEOUS) ×6 IMPLANT
SET MICROPUNCTURE 5F STIFF (MISCELLANEOUS) IMPLANT
SPONGE GAUZE 2X2 STER 10/PKG (GAUZE/BANDAGES/DRESSINGS) ×1
SPONGE SURGIFOAM ABS GEL 100 (HEMOSTASIS) IMPLANT
SUT ETHILON 3 0 PS 1 (SUTURE) ×3 IMPLANT
SUT PROLENE 6 0 BV (SUTURE) ×3 IMPLANT
SUT PROLENE 7 0 BV 1 (SUTURE) ×3 IMPLANT
SUT VIC AB 3-0 SH 27 (SUTURE) ×1
SUT VIC AB 3-0 SH 27X BRD (SUTURE) ×2 IMPLANT
SUT VICRYL 4-0 PS2 18IN ABS (SUTURE) ×6 IMPLANT
SYR 20CC LL (SYRINGE) ×6 IMPLANT
SYR 5ML LL (SYRINGE) ×3 IMPLANT
SYR CONTROL 10ML LL (SYRINGE) ×3 IMPLANT
SYRINGE 10CC LL (SYRINGE) ×3 IMPLANT
UNDERPAD 30X30 (UNDERPADS AND DIAPERS) ×3 IMPLANT
WATER STERILE IRR 1000ML POUR (IV SOLUTION) ×3 IMPLANT
WIRE AMPLATZ SS-J .035X180CM (WIRE) IMPLANT

## 2016-03-27 NOTE — Progress Notes (Signed)
[facility] Progress Note   Subjective:  Seen in PACU after PC and AVF placement Objective Vitals:   03/27/16 1051 03/27/16 1418 03/27/16 1430 03/27/16 1447  BP: 136/70 (!) 153/69 137/64 137/69  Pulse: 78 69 70 70  Resp:  14 14 11   Temp:  97.5 F (36.4 C)    TempSrc:      SpO2:  100% 100% 100%  Weight:      Height:       Physical Exam General: Pleasant, NAD Heart: S1,S2, RRR Lungs: BBS CTA A/P Abdomen: Soft, non-tender, active BS Extremities: No LE edema, pedal pulses intact.  Dialysis Access: new right sided PC and right AVF     Additional Objective Labs: Basic Metabolic Panel:  Recent Labs Lab 03/24/16 1850 03/25/16 0439 03/27/16 0517  NA 136 136 136  K 4.0 3.3* 3.5  CL 103 104 106  CO2 21* 20* 22  GLUCOSE 257* 89 119*  BUN 86* 82* 77*  CREATININE 7.46* 7.08* 6.89*  CALCIUM 7.6* 7.3* 8.0*  PHOS  --   --  4.0   Liver Function Tests:  Recent Labs Lab 03/24/16 1850 03/25/16 0439  AST 21 16  ALT 12* 10*  ALKPHOS 132* 103  BILITOT 0.3 0.5  PROT 7.1 5.9*  ALBUMIN 3.8 3.1*   CBC:  Recent Labs Lab 03/24/16 1850 03/25/16 0439 03/25/16 1331 03/27/16 0517  WBC 7.4 4.7  --  5.3  NEUTROABS 5.8 2.7  --   --   HGB 8.4* 6.9* 8.2* 8.0*  HCT 24.7* 20.5* 24.2* 24.3*  MCV 73.5* 73.5*  --  75.2*  PLT 214 190  --  171   Blood Culture    Component Value Date/Time   SDES BLOOD HAND RIGHT 08/02/2012 0840   SPECREQUEST BOTTLES DRAWN AEROBIC AND ANAEROBIC 10CC EACH 08/02/2012 0840   CULT NO GROWTH 5 DAYS 08/02/2012 0840   REPTSTATUS 08/08/2012 FINAL 08/02/2012 0840    Cardiac Enzymes:  Recent Labs Lab 03/25/16 0727 03/25/16 1331 03/25/16 1857  TROPONINI 0.03* 0.06* 0.03*   CBG:  Recent Labs Lab 03/26/16 1728 03/26/16 2113 03/27/16 0808 03/27/16 1029 03/27/16 1418  GLUCAP 161* 90 116* 116* 121*   Iron Studies:   Recent Labs  03/25/16 0737  IRON 43*  TIBC 344  FERRITIN 11*   @lablastinr3 @ Studies/Results: Ct  Abdomen Pelvis Wo Contrast  Result Date: 03/26/2016 CLINICAL DATA:  Fatigue, diarrhea, and weight loss EXAM: CT ABDOMEN AND PELVIS WITHOUT CONTRAST TECHNIQUE: Multidetector CT imaging of the abdomen and pelvis was performed following the standard protocol without IV contrast. COMPARISON:  July 21, 2012 FINDINGS: Lower chest: There are small bilateral pleural effusions. Cardiomegaly is identified. The left lung base is clear. Mild opacity seen in the right lung base that is series 3, image 10. No suspicious masses Hepatobiliary: No focal liver abnormality is seen. No gallstones, gallbladder wall thickening, or biliary dilatation. Pancreas: Unremarkable. No pancreatic ductal dilatation or surrounding inflammatory changes. Spleen: Normal in size without focal abnormality. Adrenals/Urinary Tract: The adrenal glands are normal. Several tiny stones are seen in the kidneys. Numerous bilateral renal cysts are identified. There are also a few high attenuation rounded regions in the kidneys. These were noted to represent hyperdense cysts on a previous MRI. No suspicious solid masses identified. There is no hydronephrosis. There is moderate to severe perinephric stranding bilaterally which is symmetric. There was perinephric stranding in a similar distribution previously but the finding has worsened. The bladder is unremarkable. Stomach/Bowel: The stomach and small  bowel are unremarkable. Colonic diverticulosis is seen without diverticulitis. The appendix is well seen with no appendicitis. Vascular/Lymphatic: Atherosclerotic changes seen in the non aneurysmal aorta. No adenopathy. Reproductive: The prostate is enlarged. Other: No free air or free fluid. Musculoskeletal: Degenerative changes seen in the thoracic and lumbar spine. No acute bony abnormalities. IMPRESSION: 1. Worsening cysts and hyperdense cysts in the kidneys in the interval. There is no hydronephrosis to suggest obstruction. Nonobstructive renal stones.  There is bilateral symmetric perinephric stranding which is moderate to severe and significantly worsened since the previous study. However, the distribution of the stranding was similar on the previous study. I suspect the stranding is simply due to the underlying cause of renal insufficiency such as nephritis. The stranding could be seen with an acute process such as pyelonephritis but the symmetry and lack of obvious renal edema suggests this is less likely. Recommend clinical correlation and correlation with urinalysis. 2. Small bilateral pleural effusions.  Cardiomegaly. 3. Mild opacity in the right lung base may represent pneumonia. Recommend follow-up to resolution. 4. Atherosclerotic change in the aorta. Electronically Signed   By: Dorise Bullion III M.D   On: 03/26/2016 20:46   Dg Cyndy Freeze Guide Cv Line-no Report  Result Date: 03/27/2016 CLINICAL DATA:  FLOURO GUIDE CV LINE Fluoroscopy was utilized by the requesting physician.  No radiographic interpretation.   Medications: . sodium chloride    . lactated ringers     . [MAR Hold] calcitRIOL  0.25 mcg Oral Daily  . [MAR Hold] ceFEPime (MAXIPIME) IV  1 g Intravenous Q24H  . [MAR Hold] cloNIDine  0.1 mg Oral BID  . [MAR Hold] darbepoetin (ARANESP) injection - DIALYSIS  40 mcg Intravenous Q Mon-HD  . [MAR Hold] diltiazem  240 mg Oral Daily  . [MAR Hold] feeding supplement (NEPRO CARB STEADY)  237 mL Oral BID BM  . [MAR Hold] ferric gluconate (FERRLECIT/NULECIT) IV  125 mg Intravenous Q M,W,F-HD  . [MAR Hold] heparin subcutaneous  5,000 Units Subcutaneous Q8H  . [MAR Hold] Influenza vac split quadrivalent PF  0.5 mL Intramuscular Tomorrow-1000  . [MAR Hold] insulin aspart  0-9 Units Subcutaneous TID WC  . [MAR Hold] labetalol  200 mg Oral BID  . [MAR Hold] vancomycin  1,500 mg Intravenous Once     Assessment/Plan: 1. CKDstage V - uremic, lost to f/u , last visit 3 yrs ago.  Lost 40 lbs.  Ready to start dialysis. s/p TDC and perm access  placed today, 1st HD tonight, second tomorrow, third Friday. Start CLIP process to [facility]. Scr 7.08 BUN 82 Alk Phos 103.  I talked to him about possible home dialysis options and maybe transplant- although age may be a stumbling block 2. HTN/Volume - on  bp medications- clonidine/cardizem and labetalol, BP controlled at present. No edema. Will have HD tomorrow, run even. No heparin. Probably will be able to wean BP meds as HD gets started- will start with clonidine 3. Anemia of CKD/ also guiac + - transfused, low tsat will start IV Fe and ESA. GI following. HGB down to 6.9 rec'd 1 unit PRBCs, repeat HGB 8.2, now 8   Follow CBC  4. CKD BMD: Ca 7.3 C Ca 8. Use 2.5 Ca bath check renal panel with HD tomorrow. On daily calcitriol. Increase dose and change to dialysis days after he starts HD. PTH pending- phos 4.0- no binder yet 5. 40 lb wt loss: probably D/T uremia however GI following.   6. Nutrition: Albumin 3.2.  Renal/Carb mod diet.  7. DM: per primary.  Corliss Parish, MD 03/27/2016

## 2016-03-27 NOTE — Progress Notes (Signed)
SLP Cancellation Note  Patient Details Name: POLO TORCHIA MRN: [id number] DOB: [birth year]   Cancelled treatment:       Reason Eval/Treat Not Completed: Patient at procedure or test/unavailable   Kaynen Minner, Katherene Ponto 03/27/2016, 10:34 AM

## 2016-03-27 NOTE — Progress Notes (Signed)
Right  Upper Extremity Vein Map    Cephalic  Segment Diameter Depth Comment  1. Axilla 2.71mm 5.107mm   2. Mid upper arm 1.75mm 1.101mm   3. Above AC mm mm   4. In [facility] 3.62mm 1.83mm   6. Mid forearm 1.18mm 1.34mm branches  7. Wrist mm mm Too small   Basilic  2. Mid upper arm 4.37mm 7.76mm Enters brachial  3. Above AC 4.37mm 5.71mm   4. In [facility] 3.66mm 3.45mm   5. Below AC 1.12mm 1.18mm   6. Mid forearm 1.81mm 1.8mm   7. Wrist mm mm Too small

## 2016-03-27 NOTE — Transfer of Care (Signed)
Immediate Anesthesia Transfer of Care Note  Patient: Tim Armstrong  Procedure(s) Performed: Procedure(s): CREATION  OF RIGHT UPPER ARM ARTERIOVENOUS (AV) FISTULA (Right) INSERTION OF DIALYSIS CATHETER RIGHT INTERNAL JUGULAR (N/A)  Patient Location: PACU  Anesthesia Type:General  Level of Consciousness: awake, alert  and oriented  Airway & Oxygen Therapy: Patient Spontanous Breathing and Patient connected to nasal cannula oxygen  Post-op Assessment: Report given to RN, Post -op Vital signs reviewed and stable and Patient moving all extremities X 4  Post vital signs: Reviewed and stable  Last Vitals:  Vitals:   03/27/16 1430 03/27/16 1447  BP: 137/64 137/69  Pulse: 70 70  Resp: 14 11  Temp:      Last Pain:  Vitals:   03/27/16 1447  TempSrc:   PainSc: Asleep         Complications: No apparent anesthesia complications

## 2016-03-27 NOTE — Progress Notes (Signed)
Patient Name:  Tim Armstrong, Tim Armstrong: Sep 24, 1945, MRN: [id number] Primary Doctor: Philis Fendt, MD Primary Cardiologist:   Date: 03/27/2016   SUBJECTIVE: Patient is stable today. Echo is pending.   Past Medical History:  Diagnosis Date  . Bilateral renal masses   . CKD (chronic kidney disease), stage V ([place]) NEPHROLOGIST--  DR Florene Glen -- LOV NOTE W/ CHART 09-17-2012   SECONDARY TO HYPERTENSIVE NEPHROSCLEROSIS  . Constipation   . Diabetes mellitus with complication ([place])   . Diabetes mellitus, type 2 ([place])   . Elevated PSA   . History of hematuria   . History of hypoglycemia   . Hypertension   . Nocturia   . Spermatocele    RIGHT   Vitals:   03/26/16 1902 03/26/16 2115 03/27/16 0431 03/27/16 0741  BP: (!) 141/62 137/67 (!) 144/76 135/80  Pulse: 62 65 68 71  Resp: 16 16 17 18   Temp: 97.8 F (36.6 C) 98.4 F (36.9 C) 98.2 F (36.8 C) 98.4 F (36.9 C)  TempSrc: Oral Oral Oral Oral  SpO2: 100% 100% 99% 99%  Weight:  153 lb (69.4 kg)    Height:        Intake/Output Summary (Last 24 hours) at 03/27/16 0856 Last data filed at 03/27/16 0430  Gross per 24 hour  Intake             1080 ml  Output              400 ml  Net              680 ml   Filed Weights   03/24/16 2300 03/25/16 2025 03/26/16 2115  Weight: 161 lb 3.2 oz (73.1 kg) 164 lb 3.9 oz (74.5 kg) 153 lb (69.4 kg)     LABS: Basic Metabolic Panel:  Recent Labs  03/25/16 0439 03/27/16 0517  NA 136 136  K 3.3* 3.5  CL 104 106  CO2 20* 22  GLUCOSE 89 119*  BUN 82* 77*  CREATININE 7.08* 6.89*  CALCIUM 7.3* 8.0*  PHOS  --  4.0   Liver Function Tests:  Recent Labs  03/24/16 1850 03/25/16 0439  AST 21 16  ALT 12* 10*  ALKPHOS 132* 103  BILITOT 0.3 0.5  PROT 7.1 5.9*  ALBUMIN 3.8 3.1*   No results for input(s): LIPASE, AMYLASE in the last 72 hours. CBC:  Recent Labs  03/24/16 1850 03/25/16 0439 03/25/16 1331 03/27/16 0517  WBC 7.4 4.7  --  5.3  NEUTROABS 5.8 2.7  --   --   HGB  8.4* 6.9* 8.2* 8.0*  HCT 24.7* 20.5* 24.2* 24.3*  MCV 73.5* 73.5*  --  75.2*  PLT 214 190  --  171   Cardiac Enzymes:  Recent Labs  03/25/16 0727 03/25/16 1331 03/25/16 1857  TROPONINI 0.03* 0.06* 0.03*   BNP: Invalid input(s): POCBNP D-Dimer: No results for input(s): DDIMER in the last 72 hours. Thyroid Function Tests: No results for input(s): TSH, T4TOTAL, T3FREE, THYROIDAB in the last 72 hours.  Invalid input(s): FREET3  RADIOLOGY: Ct Abdomen Pelvis Wo Contrast  Result Date: 03/26/2016 CLINICAL DATA:  Fatigue, diarrhea, and weight loss EXAM: CT ABDOMEN AND PELVIS WITHOUT CONTRAST TECHNIQUE: Multidetector CT imaging of the abdomen and pelvis was performed following the standard protocol without IV contrast. COMPARISON:  July 21, 2012 FINDINGS: Lower chest: There are small bilateral pleural effusions. Cardiomegaly is identified. The left lung base is clear. Mild opacity seen in the right  lung base that is series 3, image 10. No suspicious masses Hepatobiliary: No focal liver abnormality is seen. No gallstones, gallbladder wall thickening, or biliary dilatation. Pancreas: Unremarkable. No pancreatic ductal dilatation or surrounding inflammatory changes. Spleen: Normal in size without focal abnormality. Adrenals/Urinary Tract: The adrenal glands are normal. Several tiny stones are seen in the kidneys. Numerous bilateral renal cysts are identified. There are also a few high attenuation rounded regions in the kidneys. These were noted to represent hyperdense cysts on a previous MRI. No suspicious solid masses identified. There is no hydronephrosis. There is moderate to severe perinephric stranding bilaterally which is symmetric. There was perinephric stranding in a similar distribution previously but the finding has worsened. The bladder is unremarkable. Stomach/Bowel: The stomach and small bowel are unremarkable. Colonic diverticulosis is seen without diverticulitis. The appendix is well  seen with no appendicitis. Vascular/Lymphatic: Atherosclerotic changes seen in the non aneurysmal aorta. No adenopathy. Reproductive: The prostate is enlarged. Other: No free air or free fluid. Musculoskeletal: Degenerative changes seen in the thoracic and lumbar spine. No acute bony abnormalities. IMPRESSION: 1. Worsening cysts and hyperdense cysts in the kidneys in the interval. There is no hydronephrosis to suggest obstruction. Nonobstructive renal stones. There is bilateral symmetric perinephric stranding which is moderate to severe and significantly worsened since the previous study. However, the distribution of the stranding was similar on the previous study. I suspect the stranding is simply due to the underlying cause of renal insufficiency such as nephritis. The stranding could be seen with an acute process such as pyelonephritis but the symmetry and lack of obvious renal edema suggests this is less likely. Recommend clinical correlation and correlation with urinalysis. 2. Small bilateral pleural effusions.  Cardiomegaly. 3. Mild opacity in the right lung base may represent pneumonia. Recommend follow-up to resolution. 4. Atherosclerotic change in the aorta. Electronically Signed   By: Dorise Bullion III M.D   On: 03/26/2016 20:46   Dg Chest Port 1 View  Result Date: 03/25/2016 CLINICAL DATA:  70 year old male with a history of dizziness EXAM: PORTABLE CHEST 1 VIEW COMPARISON:  08/06/2012 FINDINGS: Compare to the prior plain film there is increased widening of the mediastinum at the level of the vascular pedicle, with double density of the contours along the right mediastinal border, compatible with prominent right aspect of the ascending aorta. Calcifications of the aortic arch. No central vascular congestion. No pneumothorax or pleural effusion. No confluent airspace disease. No displaced fracture. IMPRESSION: No acute cardiopulmonary disease. Compare to the prior x-ray of 08/06/2012 there is  increased prominence of the mediastinal width, with either tortuosity of the ascending aorta or developing aneurysm/ectasia. Further evaluation with CTA chest recommended. These results will be called to the ordering clinician or representative by the Radiologist Assistant, and communication documented in the PACS or zVision Dashboard. Signed, Dulcy Fanny. Earleen Newport, DO [facility] Electronically Signed   By: Corrie Mckusick D.O.   On: 03/25/2016 08:08    PHYSICAL EXAM   Patient is oriented to person time and place. Affect is normal. Cardiac exam reveals S1 and S2.   TELEMETRY: I have reviewed telemetry today March 27, 2016. There is normal sinus rhythm.   ASSESSMENT AND PLAN:     Renal failure (ARF), acute on chronic (HCC)   Diabetes mellitus with renal complications (HCC)   Hypertension   AKI (acute kidney injury) ([place]    Syncope     Two-dimensional echo is pending. Telemetry so far reveals normal  sinus rhythm. We will continue to follow.   Dola Argyle 03/27/2016 8:56 AM

## 2016-03-27 NOTE — Progress Notes (Signed)
Nutrition Follow-up  DOCUMENTATION CODES:   Not applicable  INTERVENTION:  Once diet advances, continue Nepro Shake po BID, each supplement provides 425 kcal and 19 grams protein.  Renal diet education handout given.   NUTRITION DIAGNOSIS:   Food and nutrition related knowledge deficit related to other (see comment) (prior eating habits) as evidenced by per patient/family report; ongoing  GOAL:   Patient will meet greater than or equal to 90% of their needs; met  MONITOR:   PO intake, Supplement acceptance, Weight trends, Labs, I & O's  REASON FOR ASSESSMENT:   Malnutrition Screening Tool    ASSESSMENT:   70 y.o. male with chronic kidney disease stage IV, hypertension, diabetes mellitus was brought to the ER from [place] which he was visiting today. Patient states around noon time he felt week and almost passed out. He has not eaten well today and thought he may have had low blood sugar. But blood sugar was in the 200s. Patient states over the last 2 weeks he has been feeling fatigue and last 2 days he has been having multiple episodes of diarrhea. Denies any chest pain or shortness of breath. Patient denies any recent use of antibiotics or sick contacts. In the ER patient's creatinine is found to be 7. Last creatinine in our system was around 4.7 in 2014. Patient was admitted for acute on chronic renal failure probably from dehydration and was given fluid bolus. Hemoglobin is around 8 which is a drop from 12 in 2014. Patient states he has not followed up with his nephrologist since last 2 years. 2 years previously patient was advised to have AV fistula which he has not followed up with.  Procedure(9/13): ARTERIOVENOUS (AV) FISTULA CREATION VERSUS GRAFT INSERTION (Right) INSERTION OF DIALYSIS CATHETER  Pt was in procedure during attempted time of visit. Meal completion prior to NPO status was mostly 100%. Pt additionally has been consuming his Nepro shakes. RD to continue with  orders. Pt requested diet education on a renal diet prior to discharge. Per MD note, pt likely home after AV graft and tunneled catheter. Plans for first HD when [facility] placed. RD placed "Eating Healthy with Kidney Disease" handout in patient's room.   Labs and medications reviewed.   Diet Order:  Diet NPO time specified Except for: Sips with Meds  Skin:   (Incision on neck and arm)  Last BM:  9/12  Height:   Ht Readings from Last 1 Encounters:  03/24/16 5' 11"  (1.803 m)    Weight:   Wt Readings from Last 1 Encounters:  03/26/16 153 lb (69.4 kg)    Ideal Body Weight:  78.18 kg  BMI:  Body mass index is 21.34 kg/m.  Estimated Nutritional Needs:   Kcal:  1900-2100  Protein:  90-100 grams  Fluid:  Per MD  EDUCATION NEEDS:   No education needs identified at this time  Corrin Parker, MS, RD, LDN Pager # [phone] After hours/ weekend pager # [phone]

## 2016-03-27 NOTE — Anesthesia Postprocedure Evaluation (Signed)
Anesthesia Post Note  Patient: Tim Armstrong  Procedure(s) Performed: Procedure(s) (LRB): CREATION  OF RIGHT UPPER ARM ARTERIOVENOUS (AV) FISTULA (Right) INSERTION OF DIALYSIS CATHETER RIGHT INTERNAL JUGULAR (N/A)  Patient location during evaluation: PACU Anesthesia Type: General Level of consciousness: awake and alert Pain management: pain level controlled Vital Signs Assessment: post-procedure vital signs reviewed and stable Respiratory status: spontaneous breathing, nonlabored ventilation, respiratory function stable and patient connected to nasal cannula oxygen Cardiovascular status: blood pressure returned to baseline and stable Postop Assessment: no signs of nausea or vomiting Anesthetic complications: no    Last Vitals:  Vitals:   03/27/16 1540 03/27/16 1542  BP:  (!) 165/75  Pulse: 68 70  Resp: 15 15  Temp:  36.7 C    Last Pain:  Vitals:   03/27/16 1542  TempSrc:   PainSc: Asleep                 Effie Berkshire

## 2016-03-27 NOTE — Progress Notes (Signed)
Dialysis treatment completed.  500 mL ultrafiltrated.  0 mL net fluid removal.  Patient status unchanged. Lung sounds clear to ausculation in all fields. No edema. Cardiac: NSR.  Cleansed RIJ catheter with chlorhexidine.  Disconnected lines and flushed ports with saline per protocol.  Ports locked with heparin and capped per protocol.    Report given to bedside, RN Charito.

## 2016-03-27 NOTE — Progress Notes (Addendum)
Pharmacy Antibiotic Note  Tim Armstrong is a 70 y.o. male admitted on 03/24/2016 with pneumonia.  Pharmacy has been consulted for vancomycin and cefepime dosing.  Of note, patient is CKD5 and now ESRD starting HD. To go for AVF and TDC placement today ~noon and then go for first HD session after.  Plan: -Vancomycin 1500mg  IV x1 - already left the floor for procedure before dose could be given. Plan to give in HD this afternoon -Getting Zinacef 1.5g pre-op this morning -Start cefepime 1g IV q24h @ 2000 tonight -Follow HD schedule/tolerance and need for vancomycin dosing  Height: 5\' 11"  (180.3 cm) Weight: 153 lb (69.4 kg) IBW/kg (Calculated) : 75.3  Temp (24hrs), Avg:98.2 F (36.8 C), Min:97.8 F (36.6 C), Max:98.4 F (36.9 C)   Recent Labs Lab 03/24/16 1824 03/24/16 1850 03/25/16 0439 03/27/16 0517  WBC  --  7.4 4.7 5.3  CREATININE 7.50* 7.46* 7.08* 6.89*    Estimated Creatinine Clearance: 9.7 mL/min (by C-G formula based on SCr of 6.89 mg/dL (H)).    Allergies  Allergen Reactions  . Ciprofloxacin Other (See Comments)    HYPOGLYCEMIA    Antimicrobials this admission: Zinacef 9/13 x1 Vancomycin 9/13 >>  Cefepime 9/13 >>    Dose adjustments this admission: n/a  Microbiology results: 9/10 CDiff: antigen and toxin negative 9/10 GI panel: negative  Thank you for allowing pharmacy to be a part of this patient's care.  [name] D. Ranyah Groeneveld, PharmD, BCPS Clinical Pharmacist Pager: [phone] 03/27/2016 10:11 AM

## 2016-03-27 NOTE — Op Note (Signed)
Procedure: Ultrasound of neck, Right Internal jugular vein Palindrome catheter, Right Brachial Cephalic AV fistula  Preop: ESRD  Postop: ESRD  Anesthesia: General  Assistant:  Gerri Lins, PA-C  Findings: 2.5 mm cephalic vein, 23 cm palindrome catheter  Operative details: After obtaining informed consent, the patient was taken to the operating room. The patient was placed in supine position on the operating room table. After adequate sedation the patient's entire neck and chest were prepped and draped in usual sterile fashion. The patient was placed in Trendelenburg position. Ultrasound was used to identify the patient's right internal jugular vein. This had normal compressibility and respiratory variation. Local anesthesia was infiltrated over the right jugular vein.  Using ultrasound guidance, the right internal jugular vein was successfully cannulated.  A 0.035 J-tipped guidewire was threaded into the right internal jugular vein and into the superior vena cava followed by the inferior vena cava under fluoroscopic guidance.   Next sequential 12 and 14 dilators were placed over the guidewire into the right atrium.  A 16 French dilator with a peel-away sheath was then placed over the guidewire into the right atrium.   The guidewire and dilator were removed. A 23 cm Palindrome catheter was then placed through the peel away sheath into the right atrium.  The catheter was then tunneled subcutaneously, cut to length, and the hub attached. The catheter was noted to flush and draw easily. The catheter was inspected under fluoroscopy and found with its tip to be in the right atrium without any kinks throughout its course. The catheter was sutured to the skin with nylon sutures. The neck insertion site was closed with a Vicryl stitch. The catheter was then loaded with concentrated Heparin solution. A dry sterile dressing was applied.  Next, the right upper extremity was prepped and draped in usual sterile  fashion.  A transverse incision was then made near the antecubital crease the right arm. The incision was carried into the subcutaneous tissues down to level of the cephalic vein. The cephalic vein was approximately 2.5 mm in diameter. It was of good quality. This was dissected free circumferentially and small side branches ligated and divided between silk ties or clips. Next the brachial artery was dissected free in the medial portion of the incision. The artery was  3-4 mm in diameter. The vessel loops were placed proximal and distal to the planned site of arteriotomy. The patient was given 3000 units of intravenous heparin. After appropriate circulation time, the vessel loops were used to control the artery. A longitudinal opening was made in the brachial artery.  The vein was ligated distally with a 2-0 silk tie. The vein was controlled proximally with a fine bulldog clamp. The vein was then swung over to the artery and sewn end of vein to side of artery using a running 7-0 Prolene suture. Just prior to completion of the anastomosis, everything was fore bled back bled and thoroughly flushed. The anastomosis was secured, vessel loops released, and there was a palpable thrill in the fistula immediately. After hemostasis was obtained, the subcutaneous tissues were reapproximated using a running 3-0 Vicryl suture. The skin was then closed with a 4 0 Vicryl subcuticular stitch. Dermabond was applied to the skin incision.  The patient had a palpable radial pulse at the end of the case.  The patient tolerated procedure well and there were no complications. Instrument sponge and needle counts correct in the case. The patient was taken to the recovery room in stable condition. Chest  x-ray will be obtained in the recovery room.  Ruta Hinds, MD [facility] [place] Office: [phone] Pager: [phone]

## 2016-03-27 NOTE — Progress Notes (Signed)
Patient arrived to unit by bed.  Reviewed treatment plan and this RN agrees with plan.  Report received from bedside RN, Lurena Joiner.  Consent obtained.  Patient A & o X 4.   Lung sounds clear to ausculation in all fields. No edema. Cardiac:  NSR.  Removed caps and cleansed RIJ catheter with chlorhedxidine.  Aspirated ports of heparin and flushed them with saline per protocol.  Connected and secured lines, initiated treatment at 1836.  UF Goal of 562mL and net fluid removal 0L.  Will continue to monitor.

## 2016-03-27 NOTE — Anesthesia Procedure Notes (Signed)
Procedure Name: Intubation Date/Time: 03/27/2016 12:21 PM Performed by: Mariea Clonts Pre-anesthesia Checklist: Patient identified, Emergency Drugs available, Suction available and Patient being monitored Patient Re-evaluated:Patient Re-evaluated prior to inductionOxygen Delivery Method: Circle System Utilized Preoxygenation: Pre-oxygenation with 100% oxygen Intubation Type: IV induction Ventilation: Mask ventilation without difficulty and Oral airway inserted - appropriate to patient size Laryngoscope Size: Glidescope Grade View: Grade III Tube type: Oral Tube size: 7.5 mm Number of attempts: 2 Airway Equipment and Method: Stylet and Oral airway Placement Confirmation: ETT inserted through vocal cords under direct vision,  positive ETCO2 and breath sounds checked- equal and bilateral Tube secured with: Tape Dental Injury: Teeth and Oropharynx as per pre-operative assessment

## 2016-03-27 NOTE — Progress Notes (Signed)
[facility]  PROGRESS NOTE  Tim Armstrong [id number] DOB: 19-May-1946 DOA: 03/24/2016 PCP: Philis Fendt, MD    Brief HPI:   70 y.o.malewith chronic kidney disease stage IV, hypertension, diabetes mellitus was brought to the ER from [place] he became lightheaded and near-syncopal. Blood sugar was in the 200s. Patient states over the last 2 weeks he hasbeen feeling fatigue and last 2 days he has been having multiple episodes of diarrhea.     In the ER patient's creatinine is found to be 7. Last creatinine in our system was around 4.7 in 2014 and at that time, he was recommended to have a fistula placed. He was lost to follow up. He was found also to have a Hb of 8 which has dropped to 6.9 with IVF given in ER.    Assessment/Plan:    1. CKD stage V-patient seen by nephrology, to initiating dialysis in a.m. Vascular surgery has been consulted for tunneled catheter and AV graft in a.m . Patient is likely uremic with weight loss.Start CLIP process to [facility] 2. Anemia with blood positive stool- gastroenterology has been consulted  , CT  abdomen and pelvis with oral contrast, shows worsening bilateral renal cysts, no hydronephrosis, cannot rule out pyelonephritis. Possible pneumonia in the right lower lobe. Patient has had 40 pound weight loss history. Hemoglobin is 8.0 after blood transfusion. Will check CBC in a.m. pending further GI recommendations. 3. Diabetes mellitus-hold Glucotrol,  glucose is well controlled, continue sliding scale insulin with NovoLog. 4. Hypertension-blood pressure is controlled, continue labetalol, Cardizem. 5. Diarrhea- stool for C. difficile is negative, we'll start Imodium when necessary for diarrhea 6. Syncope- likely from diarrhea, but patient has had 3 episodes of syncope in the recent past. Will check 2-D echo, monitor on telemetry 7. Elevated troponin- patient had mild elevation of troponin 0.06, which came down to 0.03, no  chest pain. EKG showed nonspecific T-wave abnormality and T-wave inversions in lead V2 to V5 . 2-D echo pending. Cardiology consulted  for pre-syncope. Patient may need outpatient event monitor 8. HCAP-currently on cefuroxime , dc start cefepime and   vancomycin   DVT prophylaxis: Heparin Code Status: Full code Family Communication: No family at bedside Disposition Plan: Likely home after AV graft and tunneled catheter    Consultants:  Vascular surgery  Nephrology  GI  Cardiology  Procedures:  None   Antibiotics: Anti-infectives    Start     Dose/Rate Route Frequency Ordered Stop   03/27/16 0600  cefUROXime (ZINACEF) 1.5 g in dextrose 5 % 50 mL IVPB     1.5 g 100 mL/hr over 30 Minutes Intravenous To [facility] 03/26/16 1003 03/28/16 0600      Subjective  had several episodes of dizziness  But these are improving     Objective    Objective: Vitals:   03/26/16 1902 03/26/16 2115 03/27/16 0431 03/27/16 0741  BP: (!) 141/62 137/67 (!) 144/76 135/80  Pulse: 62 65 68 71  Resp: 16 16 17 18   Temp: 97.8 F (36.6 C) 98.4 F (36.9 C) 98.2 F (36.8 C) 98.4 F (36.9 C)  TempSrc: Oral Oral Oral Oral  SpO2: 100% 100% 99% 99%  Weight:  69.4 kg (153 lb)    Height:        Intake/Output Summary (Last 24 hours) at 03/27/16 0828 Last data filed at 03/27/16 0430  Gross per 24 hour  Intake  1080 ml  Output              400 ml  Net              680 ml   Filed Weights   03/24/16 2300 03/25/16 2025 03/26/16 2115  Weight: 73.1 kg (161 lb 3.2 oz) 74.5 kg (164 lb 3.9 oz) 69.4 kg (153 lb)    Examination:  General exam: Appears calm and comfortable  Respiratory system: Clear to auscultation. Respiratory effort normal. Cardiovascular system: S1 & S2 heard, RRR. No JVD, murmurs, rubs, gallops or clicks. No pedal edema. Gastrointestinal system: Abdomen is nondistended, soft and nontender. No organomegaly or masses felt. Normal bowel sounds heard. Central  nervous system: Alert and oriented. No focal neurological deficits. Extremities: Symmetric 5 x 5 power. Skin: No rashes, lesions or ulcers Psychiatry: Judgement and insight appear normal. Mood & affect appropriate.    Data Reviewed: I have personally reviewed following labs and imaging studies Basic Metabolic Panel:  Recent Labs Lab 03/24/16 1824 03/24/16 1850 03/25/16 0439 03/27/16 0517  NA 139 136 136 136  K 4.2 4.0 3.3* 3.5  CL 101 103 104 106  CO2  --  21* 20* 22  GLUCOSE 237* 257* 89 119*  BUN 79* 86* 82* 77*  CREATININE 7.50* 7.46* 7.08* 6.89*  CALCIUM  --  7.6* 7.3* 8.0*  PHOS  --   --   --  4.0   Liver Function Tests:  Recent Labs Lab 03/24/16 1850 03/25/16 0439  AST 21 16  ALT 12* 10*  ALKPHOS 132* 103  BILITOT 0.3 0.5  PROT 7.1 5.9*  ALBUMIN 3.8 3.1*   No results for input(s): LIPASE, AMYLASE in the last 168 hours. No results for input(s): AMMONIA in the last 168 hours. CBC:  Recent Labs Lab 03/24/16 1824 03/24/16 1850 03/25/16 0439 03/25/16 1331 03/27/16 0517  WBC  --  7.4 4.7  --  5.3  NEUTROABS  --  5.8 2.7  --   --   HGB 8.2* 8.4* 6.9* 8.2* 8.0*  HCT 24.0* 24.7* 20.5* 24.2* 24.3*  MCV  --  73.5* 73.5*  --  75.2*  PLT  --  214 190  --  171   Cardiac Enzymes:  Recent Labs Lab 03/25/16 0727 03/25/16 1331 03/25/16 1857  TROPONINI 0.03* 0.06* 0.03*   BNP (last 3 results) No results for input(s): BNP in the last 8760 hours.  ProBNP (last 3 results) No results for input(s): PROBNP in the last 8760 hours.  CBG:  Recent Labs Lab 03/26/16 0756 03/26/16 1146 03/26/16 1728 03/26/16 2113 03/27/16 0808  GLUCAP 129* 192* 161* 90 116*    Recent Results (from the past 240 hour(s))  C difficile quick scan w PCR reflex     Status: None   Collection Time: 03/24/16 10:02 PM  Result Value Ref Range Status   C Diff antigen NEGATIVE NEGATIVE Final   C Diff toxin NEGATIVE NEGATIVE Final   C Diff interpretation No C. difficile detected.   Final  Gastrointestinal Panel by PCR , Stool     Status: None   Collection Time: 03/24/16 10:02 PM  Result Value Ref Range Status   Campylobacter species NOT DETECTED NOT DETECTED Final   Plesimonas shigelloides NOT DETECTED NOT DETECTED Final   Salmonella species NOT DETECTED NOT DETECTED Final   Yersinia enterocolitica NOT DETECTED NOT DETECTED Final   Vibrio species NOT DETECTED NOT DETECTED Final   Vibrio cholerae NOT DETECTED NOT DETECTED Final  Enteroaggregative E coli (EAEC) NOT DETECTED NOT DETECTED Final   Enteropathogenic E coli (EPEC) NOT DETECTED NOT DETECTED Final   Enterotoxigenic E coli (ETEC) NOT DETECTED NOT DETECTED Final   Shiga like toxin producing E coli (STEC) NOT DETECTED NOT DETECTED Final   E. coli O157 NOT DETECTED NOT DETECTED Final   Shigella/Enteroinvasive E coli (EIEC) NOT DETECTED NOT DETECTED Final   Cryptosporidium NOT DETECTED NOT DETECTED Final   Cyclospora cayetanensis NOT DETECTED NOT DETECTED Final   Entamoeba histolytica NOT DETECTED NOT DETECTED Final   Giardia lamblia NOT DETECTED NOT DETECTED Final   Adenovirus F40/41 NOT DETECTED NOT DETECTED Final   Astrovirus NOT DETECTED NOT DETECTED Final   Norovirus GI/GII NOT DETECTED NOT DETECTED Final   Rotavirus A NOT DETECTED NOT DETECTED Final   Sapovirus (I, II, IV, and V) NOT DETECTED NOT DETECTED Final     Studies: Ct Abdomen Pelvis Wo Contrast  Result Date: 03/26/2016 CLINICAL DATA:  Fatigue, diarrhea, and weight loss EXAM: CT ABDOMEN AND PELVIS WITHOUT CONTRAST TECHNIQUE: Multidetector CT imaging of the abdomen and pelvis was performed following the standard protocol without IV contrast. COMPARISON:  July 21, 2012 FINDINGS: Lower chest: There are small bilateral pleural effusions. Cardiomegaly is identified. The left lung base is clear. Mild opacity seen in the right lung base that is series 3, image 10. No suspicious masses Hepatobiliary: No focal liver abnormality is seen. No gallstones,  gallbladder wall thickening, or biliary dilatation. Pancreas: Unremarkable. No pancreatic ductal dilatation or surrounding inflammatory changes. Spleen: Normal in size without focal abnormality. Adrenals/Urinary Tract: The adrenal glands are normal. Several tiny stones are seen in the kidneys. Numerous bilateral renal cysts are identified. There are also a few high attenuation rounded regions in the kidneys. These were noted to represent hyperdense cysts on a previous MRI. No suspicious solid masses identified. There is no hydronephrosis. There is moderate to severe perinephric stranding bilaterally which is symmetric. There was perinephric stranding in a similar distribution previously but the finding has worsened. The bladder is unremarkable. Stomach/Bowel: The stomach and small bowel are unremarkable. Colonic diverticulosis is seen without diverticulitis. The appendix is well seen with no appendicitis. Vascular/Lymphatic: Atherosclerotic changes seen in the non aneurysmal aorta. No adenopathy. Reproductive: The prostate is enlarged. Other: No free air or free fluid. Musculoskeletal: Degenerative changes seen in the thoracic and lumbar spine. No acute bony abnormalities. IMPRESSION: 1. Worsening cysts and hyperdense cysts in the kidneys in the interval. There is no hydronephrosis to suggest obstruction. Nonobstructive renal stones. There is bilateral symmetric perinephric stranding which is moderate to severe and significantly worsened since the previous study. However, the distribution of the stranding was similar on the previous study. I suspect the stranding is simply due to the underlying cause of renal insufficiency such as nephritis. The stranding could be seen with an acute process such as pyelonephritis but the symmetry and lack of obvious renal edema suggests this is less likely. Recommend clinical correlation and correlation with urinalysis. 2. Small bilateral pleural effusions.  Cardiomegaly. 3. Mild  opacity in the right lung base may represent pneumonia. Recommend follow-up to resolution. 4. Atherosclerotic change in the aorta. Electronically Signed   By: Dorise Bullion III M.D   On: 03/26/2016 20:46    Scheduled Meds: . calcitRIOL  0.25 mcg Oral Daily  . cefUROXime (ZINACEF)  IV  1.5 g Intravenous To SS-Surg  . cloNIDine  0.1 mg Oral BID  . darbepoetin (ARANESP) injection - DIALYSIS  40 mcg Intravenous  Q Mon-HD  . diltiazem  240 mg Oral Daily  . feeding supplement (NEPRO CARB STEADY)  237 mL Oral BID BM  . [START ON 03/29/2016] ferric gluconate (FERRLECIT/NULECIT) IV  125 mg Intravenous Q M,W,F-HD  . heparin subcutaneous  5,000 Units Subcutaneous Q8H  . [START ON 03/28/2016] Influenza vac split quadrivalent PF  0.5 mL Intramuscular Tomorrow-1000  . insulin aspart  0-9 Units Subcutaneous TID WC  . labetalol  200 mg Oral BID   Continuous Infusions:      Time spent: 25 min    [facility] Pager [phone]. If 7PM-7AM, please contact night-coverage at [email or web address], Office  [phone]  password [email or web address] 03/27/2016, 8:28 AM  LOS: 2 days

## 2016-03-27 NOTE — Anesthesia Preprocedure Evaluation (Addendum)
Anesthesia Evaluation  Patient identified by MRN, date of birth, ID band Patient awake    Reviewed: Allergy & Precautions, NPO status , Patient's Chart, lab work & pertinent test results, reviewed documented beta blocker date and time   Airway Mallampati: II  TM Distance: >3 FB Neck ROM: Full    Dental  (+) Dental Advisory Given, Missing,    Pulmonary former smoker,    breath sounds clear to auscultation       Cardiovascular hypertension, Pt. on medications and Pt. on home beta blockers  Rhythm:Regular Rate:Normal     Neuro/Psych negative neurological ROS  negative psych ROS   GI/Hepatic negative GI ROS, Neg liver ROS,   Endo/Other  diabetes, Type 2  Renal/GU CRFRenal disease  negative genitourinary   Musculoskeletal negative musculoskeletal ROS (+)   Abdominal   Peds negative pediatric ROS (+)  Hematology negative hematology ROS (+)   Anesthesia Other Findings   Reproductive/Obstetrics negative OB ROS                            03/2016 EKG: normal sinus rhythm, PAC's noted.  Anesthesia Physical Anesthesia Plan  ASA: III  Anesthesia Plan: General   Post-op Pain Management:    Induction: Intravenous  Airway Management Planned: LMA  Additional Equipment:   Intra-op Plan:   Post-operative Plan: Extubation in OR  Informed Consent: I have reviewed the patients History and Physical, chart, labs and discussed the procedure including the risks, benefits and alternatives for the proposed anesthesia with the patient or authorized representative who has indicated his/her understanding and acceptance.     Plan Discussed with: CRNA  Anesthesia Plan Comments:        Anesthesia Quick Evaluation

## 2016-03-27 NOTE — Interval H&P Note (Signed)
History and Physical Interval Note:  03/27/2016 11:57 AM  Tim Armstrong  has presented today for surgery, with the diagnosis of Stage V Chronic Kidney Disease N18.5  The various methods of treatment have been discussed with the patient and family. After consideration of risks, benefits and other options for treatment, the patient has consented to  Procedure(s): ARTERIOVENOUS (AV) FISTULA CREATION VERSUS GRAFT INSERTION (Right) INSERTION OF DIALYSIS CATHETER (N/A) as a surgical intervention .  The patient's history has been reviewed, patient examined, no change in status, stable for surgery.  I have reviewed the patient's chart and labs.  Questions were answered to the patient's satisfaction.     Ruta Hinds
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
## 2016-03-27 NOTE — Progress Notes (Signed)
Patient assessed by C. Kenton Kingfisher RN

## 2016-03-27 NOTE — Progress Notes (Signed)
Left Upper Extremity Vein Map    Cephalic  Segment Diameter Depth Comment  1. Axilla 1.1mm 68mm   2. Mid upper arm 1.74mm 1.1mm   3. Above AC mm mm Unable to image due to IV  4. In AC mm mm Unable to image due to IV  5. Below AC 2.36mm 1.8   6. Mid forearm 1.49mm 41mm   7. Wrist [id number]    Basilic  Segment Diameter Depth Comment  2. Mid upper arm 2.39mm 81mm Enters brachial  3. Above AC mm mm Unable to images due to IV  4. In AC mm mm Unable to image due to IV  5. Below AC mm mm Unable to image due to IV  6. Mid forearm 1.65mm 71mm   7. Wrist mm mm Too small

## 2016-03-27 NOTE — Progress Notes (Signed)
Tim Armstrong 4:48 PM  Subjective: Patient without any complaints and no problems from surgery and his case discussed with his wife as well  Objective: Vital signs stable afebrile patient looks well not examined today about to go to dialysis  Assessment: Multiple medical problems including chronic anemia and episodic guaiac positivity with probable history of colon polyps  Plan: We discussed beginning workup with an endoscopy and will proceed tomorrow morning at 1045 and pending those findings can consider either inpatient or outpatient colonoscopy to complete his workup  [facility] E  Pager [phone] After 5PM or if no answer call [phone]

## 2016-03-27 NOTE — Progress Notes (Signed)
   Attempted to do Echo but Patient already left  for AVF and [facility] placement procedure.  Jennette Dubin 03/27/2016, 10:19 AM

## 2016-03-28 ENCOUNTER — Inpatient Hospital Stay (HOSPITAL_COMMUNITY): Payer: Medicare HMO | Admitting: Anesthesiology

## 2016-03-28 ENCOUNTER — Encounter (HOSPITAL_COMMUNITY)
Admission: EM | Disposition: A | Discharge status: Home Health | Payer: Self-pay | Source: Home / Self Care | Attending: Internal Medicine

## 2016-03-28 ENCOUNTER — Inpatient Hospital Stay (HOSPITAL_COMMUNITY): Payer: Medicare HMO

## 2016-03-28 ENCOUNTER — Other Ambulatory Visit: Payer: Self-pay | Admitting: *Deleted

## 2016-03-28 ENCOUNTER — Encounter (HOSPITAL_COMMUNITY): Payer: Self-pay | Admitting: *Deleted

## 2016-03-28 DIAGNOSIS — Z4931 Encounter for adequacy testing for hemodialysis: Secondary | ICD-10-CM

## 2016-03-28 DIAGNOSIS — N185 Chronic kidney disease, stage 5: Secondary | ICD-10-CM

## 2016-03-28 DIAGNOSIS — Z419 Encounter for procedure for purposes other than remedying health state, unspecified: Secondary | ICD-10-CM

## 2016-03-28 HISTORY — PX: ESOPHAGOGASTRODUODENOSCOPY: SHX5428

## 2016-03-28 LAB — RENAL FUNCTION PANEL
Albumin: 2.9 g/dL — ABNORMAL LOW (ref 3.5–5.0)
Anion gap: 9 (ref 5–15)
BUN: 46 mg/dL — ABNORMAL HIGH (ref 6–20)
CO2: 25 mmol/L (ref 22–32)
Calcium: 8.3 mg/dL — ABNORMAL LOW (ref 8.9–10.3)
Chloride: 107 mmol/L (ref 101–111)
Creatinine, Ser: 5.27 mg/dL — ABNORMAL HIGH (ref 0.61–1.24)
GFR calc Af Amer: 11 mL/min — ABNORMAL LOW (ref >60)
GFR calc non Af Amer: 10 mL/min — ABNORMAL LOW (ref >60)
Glucose, Bld: 96 mg/dL (ref 65–99)
Phosphorus: 3.3 mg/dL (ref 2.5–4.6)
Potassium: 3.9 mmol/L (ref 3.5–5.1)
Sodium: 141 mmol/L (ref 135–145)

## 2016-03-28 LAB — COMPREHENSIVE METABOLIC PANEL
ALBUMIN: 2.7 g/dL — AB (ref 3.5–5.0)
ALT: 10 U/L — AB (ref 17–63)
AST: 17 U/L (ref 15–41)
Alkaline Phosphatase: 101 U/L (ref 38–126)
Anion gap: 6 (ref 5–15)
BUN: 45 mg/dL — AB (ref 6–20)
CHLORIDE: 107 mmol/L (ref 101–111)
CO2: 25 mmol/L (ref 22–32)
CREATININE: 5.11 mg/dL — AB (ref 0.61–1.24)
Calcium: 8.2 mg/dL — ABNORMAL LOW (ref 8.9–10.3)
GFR calc non Af Amer: 10 mL/min — ABNORMAL LOW (ref >60)
GFR, EST AFRICAN AMERICAN: 12 mL/min — AB (ref >60)
GLUCOSE: 84 mg/dL (ref 65–99)
Potassium: 3.9 mmol/L (ref 3.5–5.1)
SODIUM: 138 mmol/L (ref 135–145)
Total Bilirubin: 0.7 mg/dL (ref 0.3–1.2)
Total Protein: 5.7 g/dL — ABNORMAL LOW (ref 6.5–8.1)

## 2016-03-28 LAB — CBC
HCT: 26.2 % — ABNORMAL LOW (ref 39.0–52.0)
HCT: 27.4 % — ABNORMAL LOW (ref 39.0–52.0)
Hemoglobin: 8.6 g/dL — ABNORMAL LOW (ref 13.0–17.0)
Hemoglobin: 8.9 g/dL — ABNORMAL LOW (ref 13.0–17.0)
MCH: 24.9 pg — ABNORMAL LOW (ref 26.0–34.0)
MCH: 24.9 pg — ABNORMAL LOW (ref 26.0–34.0)
MCHC: 32.8 g/dL (ref 30.0–36.0)
MCHC: 32.5 g/dL (ref 30.0–36.0)
MCV: 75.9 fL — ABNORMAL LOW (ref 78.0–100.0)
MCV: 76.5 fL — ABNORMAL LOW (ref 78.0–100.0)
PLATELETS: 142 10*3/uL — AB (ref 150–400)
Platelets: 152 10*3/uL (ref 150–400)
RBC: 3.45 MIL/uL — AB (ref 4.22–5.81)
RBC: 3.58 MIL/uL — ABNORMAL LOW (ref 4.22–5.81)
RDW: 16.1 % — ABNORMAL HIGH (ref 11.5–15.5)
RDW: 16.2 % — ABNORMAL HIGH (ref 11.5–15.5)
WBC: 7.9 10*3/uL (ref 4.0–10.5)
WBC: 7.8 K/uL (ref 4.0–10.5)

## 2016-03-28 LAB — GLUCOSE, CAPILLARY
GLUCOSE-CAPILLARY: 87 mg/dL (ref 65–99)
GLUCOSE-CAPILLARY: 77 mg/dL (ref 65–99)
GLUCOSE-CAPILLARY: 125 mg/dL — AB (ref 65–99)
GLUCOSE-CAPILLARY: 139 mg/dL — AB (ref 65–99)

## 2016-03-28 LAB — PARATHYROID HORMONE, INTACT (NO CA)
PTH: 397 pg/mL — AB (ref 15–65)
PTH: 450 pg/mL — ABNORMAL HIGH (ref 15–65)

## 2016-03-28 SURGERY — EGD (ESOPHAGOGASTRODUODENOSCOPY)
Anesthesia: Monitor Anesthesia Care

## 2016-03-28 MED ORDER — PROPOFOL 500 MG/50ML IV EMUL
INTRAVENOUS | Status: DC | PRN
  Administered 2016-03-28: 75 ug/kg/min via INTRAVENOUS

## 2016-03-28 MED ORDER — AMOXICILLIN-POT CLAVULANATE 500-125 MG PO TABS
2.0000 | ORAL_TABLET | Freq: Every day | ORAL | Status: AC
  Administered 2016-03-28 – 2016-03-31 (×4): 1000 mg via ORAL
  Filled 2016-03-28: qty 2
  Filled 2016-03-28 (×4): qty 1
  Filled 2016-03-28: qty 2

## 2016-03-28 MED ORDER — PENTAFLUOROPROP-TETRAFLUOROETH EX AERO: 1.0000 "application " | INHALATION_SPRAY | CUTANEOUS | Status: DC | PRN

## 2016-03-28 MED ORDER — PROPOFOL 10 MG/ML IV BOLUS
INTRAVENOUS | Status: DC | PRN
  Administered 2016-03-28: 20 mg via INTRAVENOUS

## 2016-03-28 MED ORDER — MEPERIDINE HCL 25 MG/ML IJ SOLN: 6.2500 mg | INTRAMUSCULAR | Status: DC | PRN

## 2016-03-28 MED ORDER — LIDOCAINE HCL (PF) 1 % IJ SOLN: 5.0000 mL | INTRAMUSCULAR | Status: DC | PRN

## 2016-03-28 MED ORDER — PANTOPRAZOLE SODIUM 40 MG PO TBEC
40.0000 mg | DELAYED_RELEASE_TABLET | Freq: Every day | ORAL | Status: DC
  Administered 2016-03-28 – 2016-04-02 (×6): 40 mg via ORAL
  Filled 2016-03-28 (×6): qty 1

## 2016-03-28 MED ORDER — PROMETHAZINE HCL 25 MG/ML IJ SOLN: 6.2500 mg | INTRAMUSCULAR | Status: DC | PRN

## 2016-03-28 MED ORDER — LIDOCAINE HCL (CARDIAC) 20 MG/ML IV SOLN
INTRAVENOUS | Status: DC | PRN
  Administered 2016-03-28: 40 mg via INTRAVENOUS

## 2016-03-28 MED ORDER — FENTANYL CITRATE (PF) 100 MCG/2ML IJ SOLN: 25.0000 ug | INTRAMUSCULAR | Status: DC | PRN

## 2016-03-28 MED ORDER — LIDOCAINE-PRILOCAINE 2.5-2.5 % EX CREA: 1.0000 "application " | TOPICAL_CREAM | CUTANEOUS | Status: DC | PRN

## 2016-03-28 MED ORDER — LACTATED RINGERS IV SOLN: INTRAVENOUS | Status: DC

## 2016-03-28 MED ORDER — SODIUM CHLORIDE 0.9 % IV SOLN: 100.0000 mL | INTRAVENOUS | Status: DC | PRN

## 2016-03-28 NOTE — Anesthesia Preprocedure Evaluation (Signed)
Anesthesia Evaluation  Patient identified by MRN, date of birth, ID band Patient awake    Reviewed: Allergy & Precautions, NPO status , Patient's Chart, lab work & pertinent test results  Airway Mallampati: II  TM Distance: >3 FB Neck ROM: Full    Dental no notable dental hx.    Pulmonary neg pulmonary ROS, former smoker,    Pulmonary exam normal breath sounds clear to auscultation       Cardiovascular hypertension, Normal cardiovascular exam Rhythm:Regular Rate:Normal     Neuro/Psych negative neurological ROS  negative psych ROS   GI/Hepatic negative GI ROS, Neg liver ROS,   Endo/Other  diabetes  Renal/GU Renal Insufficiency and ESRFRenal disease  negative genitourinary   Musculoskeletal negative musculoskeletal ROS (+)   Abdominal   Peds negative pediatric ROS (+)  Hematology negative hematology ROS (+) anemia ,   Anesthesia Other Findings   Reproductive/Obstetrics negative OB ROS                             Anesthesia Physical Anesthesia Plan  ASA: III  Anesthesia Plan: MAC   Post-op Pain Management:    Induction: Intravenous  Airway Management Planned: Nasal Cannula  Additional Equipment:   Intra-op Plan:   Post-operative Plan:   Informed Consent: I have reviewed the patients History and Physical, chart, labs and discussed the procedure including the risks, benefits and alternatives for the proposed anesthesia with the patient or authorized representative who has indicated his/her understanding and acceptance.   Dental advisory given  Plan Discussed with: CRNA and Surgeon  Anesthesia Plan Comments:         Anesthesia Quick Evaluation

## 2016-03-28 NOTE — Op Note (Signed)
[facility] Patient Name: Tim Armstrong Procedure Date : 03/28/2016 MRN: [id number] Attending MD: Clarene Essex , MD Date of Birth: 02-28-1946 CSN: [id number] Age: 70 Admit Type: Inpatient Procedure:                Upper GI endoscopy Indications:              Iron deficiency anemia, Heme positive stool Providers:                Clarene Essex, MD, Carolynn Comment, RN, Alfonso Patten,                            Technician, Luciana Axe, CRNA Referring MD:              Medicines:                Propofol total dose 80 mg IV Complications:            No immediate complications. Estimated Blood Loss:     Estimated blood loss: none. Procedure:                Pre-Anesthesia Assessment:                           - Prior to the procedure, a History and Physical                            was performed, and patient medications and                            allergies were reviewed. The patient's tolerance of                            previous anesthesia was also reviewed. The risks                            and benefits of the procedure and the sedation                            options and risks were discussed with the patient.                            All questions were answered, and informed consent                            was obtained. Prior Anticoagulants: The patient has                            taken no previous anticoagulant or antiplatelet                            agents. ASA Grade Assessment: III - A patient with                            severe systemic disease. After reviewing the risks  and benefits, the patient was deemed in                            satisfactory condition to undergo the procedure.                           After obtaining informed consent, the endoscope was                            passed under direct vision. Throughout the                            procedure, the patient's blood pressure, pulse, and              oxygen saturations were monitored continuously. The                            [id number]) scope was introduced through the                            mouth, and advanced to the third part of duodenum.                            The upper GI endoscopy was accomplished without                            difficulty. The patient tolerated the procedure                            well. Scope In: Scope Out: Findings:      The larynx was normal.      A small hiatal hernia was present.      A single, small non-bleeding erosion was found in the gastric antrum.       There were no stigmata of recent bleeding.      Patchy mildly erythematous mucosa without active bleeding was found in       the duodenal bulb.      The second portion of the duodenum and third portion of the duodenum       were normal.      The exam was otherwise without abnormality. Impression:               - Normal larynx.                           - Small hiatal hernia.                           - Non-bleeding erosive gastropathy.                           - Erythematous duodenopathy.                           - Normal second portion of the duodenum and third  portion of the duodenum.                           - The examination was otherwise normal.                           - No specimens collected. Moderate Sedation:      moderate sedation-none Recommendation:           - Patient has a contact number available for                            emergencies. The signs and symptoms of potential                            delayed complications were discussed with the                            patient. Return to normal activities tomorrow.                            Written discharge instructions were provided to the                            patient.                           - Resume previous diet today.                           - Continue present medications.                            - Return to GI clinic PRN.                           - Telephone GI clinic if symptomatic PRN.                           - Perform a colonoscopy either Sunday or Monday. Procedure Code(s):        --- Professional ---                           43 235, Esophagogastroduodenoscopy, flexible,                            transoral; diagnostic, including collection of                            specimen(s) by brushing or washing, when performed                            (separate procedure) Diagnosis Code(s):        --- Professional ---                           K44.9, Diaphragmatic hernia without obstruction or  gangrene                           K31.89, Other diseases of stomach and duodenum                           D50.9, Iron deficiency anemia, unspecified                           R19.5, Other fecal abnormalities CPT copyright 2016 [employer]. All rights reserved. The codes documented in this report are preliminary and upon coder review may  be revised to meet current compliance requirements. Clarene Essex, MD 03/28/2016 11:36:46 AM This report has been signed electronically. Number of Addenda: 0

## 2016-03-28 NOTE — Anesthesia Procedure Notes (Signed)
Procedure Name: MAC Date/Time: 03/28/2016 11:21 AM Performed by: Jenne Campus Pre-anesthesia Checklist: Patient identified, Suction available, Emergency Drugs available, Patient being monitored and Timeout performed Oxygen Delivery Method: Nasal cannula

## 2016-03-28 NOTE — Progress Notes (Signed)
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

## 2016-03-28 NOTE — Care Management Important Message (Signed)
Important Message  Patient Details  Name: Tim Armstrong MRN: [id number] Date of Birth: 20-May-1946   Medicare Important Message Given:  Yes    Kindred Reidinger Abena 03/28/2016, 11:19 AM

## 2016-03-28 NOTE — Anesthesia Postprocedure Evaluation (Signed)
Anesthesia Post Note  Patient: Tim Armstrong  Procedure(s) Performed: Procedure(s) (LRB): ESOPHAGOGASTRODUODENOSCOPY (EGD) (N/A)  Patient location during evaluation: PACU Anesthesia Type: MAC Level of consciousness: awake and alert Pain management: pain level controlled Vital Signs Assessment: post-procedure vital signs reviewed and stable Respiratory status: spontaneous breathing, nonlabored ventilation, respiratory function stable and patient connected to nasal cannula oxygen Cardiovascular status: stable and blood pressure returned to baseline Anesthetic complications: no    Last Vitals:  Vitals:   03/28/16 1140 03/28/16 1150  BP: (!) 164/82 (!) 164/82  Pulse: 84   Resp: 12 (!) 21  Temp:      Last Pain:  Vitals:   03/28/16 1133  TempSrc: Oral  PainSc:                  Brinae Woods S

## 2016-03-28 NOTE — Transfer of Care (Signed)
Immediate Anesthesia Transfer of Care Note  Patient: Tim Armstrong  Procedure(s) Performed: Procedure(s): ESOPHAGOGASTRODUODENOSCOPY (EGD) (N/A)  Patient Location: Endoscopy Unit  Anesthesia Type:MAC  Level of Consciousness: awake, oriented and patient cooperative  Airway & Oxygen Therapy: Patient Spontanous Breathing and Patient connected to nasal cannula oxygen  Post-op Assessment: Report given to RN and Post -op Vital signs reviewed and stable  Post vital signs: Reviewed  Last Vitals:  Vitals:   03/28/16 0807 03/28/16 1011  BP: (!) 160/68 (!) 172/74  Pulse: 85 88  Resp: 17 (!) 25  Temp: 36.9 C 37.6 C    Last Pain:  Vitals:   03/28/16 1011  TempSrc: Oral  PainSc:          Complications: No apparent anesthesia complications

## 2016-03-28 NOTE — Progress Notes (Signed)
PT Cancellation Note  Patient Details Name: Tim Armstrong MRN: [id number] DOB: April 02, 1946   Cancelled Treatment:    Reason Eval/Treat Not Completed: Patient at procedure or test/unavailable.  Will return at later time for PT evaluation.   Despina Pole 03/28/2016, 12:46 PM Carita Pian. Sanjuana Kava, [place] Pager [phone]

## 2016-03-28 NOTE — Progress Notes (Signed)
Patient Name: Tim Armstrong Date of Encounter: 03/28/2016  Hospital Problem List     Principal Problem:   Renal failure (ARF), acute on chronic [facility]) Active Problems:   Diabetes mellitus with renal complications (HCC)   Hypertension   AKI (acute kidney injury) ([place])   Syncope   Dizziness   Surgery, elective    Subjective   Feeling well today, no further episodes of syncope. Planned for Endoscopy this morning.   Inpatient Medications    . calcitRIOL  0.25 mcg Oral Daily  . ceFEPime (MAXIPIME) IV  1 g Intravenous Q24H  . cloNIDine  0.1 mg Oral BID  . darbepoetin (ARANESP) injection - DIALYSIS  40 mcg Intravenous Q Mon-HD  . diltiazem  240 mg Oral Daily  . feeding supplement (NEPRO CARB STEADY)  237 mL Oral BID BM  . [START ON 03/29/2016] ferric gluconate (FERRLECIT/NULECIT) IV  125 mg Intravenous Q M,W,F-HD  . heparin subcutaneous  5,000 Units Subcutaneous Q8H  . Influenza vac split quadrivalent PF  0.5 mL Intramuscular Tomorrow-1000  . insulin aspart  0-9 Units Subcutaneous TID WC  . labetalol  200 mg Oral BID    Vital Signs    Vitals:   03/27/16 2110 03/27/16 2149 03/28/16 0446 03/28/16 0807  BP: (!) 165/83 (!) 174/77 (!) 145/68 (!) 160/68  Pulse: 83 89 83 85  Resp:  15 16 17   Temp:  97.5 F (36.4 C) 98.8 F (37.1 C) 98.5 F (36.9 C)  TempSrc:  Oral Oral Oral  SpO2:  100% 98% 99%  Weight:  143 lb 15.4 oz (65.3 kg)    Height:        Intake/Output Summary (Last 24 hours) at 03/28/16 0841 Last data filed at 03/28/16 0600  Gross per 24 hour  Intake              650 ml  Output              900 ml  Net             -250 ml   Filed Weights   03/27/16 1831 03/27/16 2106 03/27/16 2149  Weight: 143 lb 15.4 oz (65.3 kg) 143 lb 15.4 oz (65.3 kg) 143 lb 15.4 oz (65.3 kg)    Physical Exam    General: Pleasant AA male, NAD. Neuro: Alert and oriented X 3. Moves all extremities spontaneously. Psych: Normal affect. HEENT:  Normal  Neck: Supple without bruits or  JVD. Lungs:  Resp regular and unlabored, CTA. Heart: RRR no s3, s4, or murmurs. Abdomen: Soft, non-tender, non-distended, BS + x 4.  Extremities: No clubbing, cyanosis or edema. DP/PT/Radials 2+ and equal bilaterally. 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   Labs    CBC  Recent Labs  03/27/16 0517 03/27/16 2014  WBC 5.3 7.1  HGB 8.0* 8.6*  HCT 24.3* 25.9*  MCV 75.2* 75.5*  PLT 171 [id number]*   Basic Metabolic Panel  Recent Labs  03/27/16 0517 03/27/16 1858  NA 136 138  K 3.5 3.8  CL 106 106  CO2 22 23  GLUCOSE 119* 133*  BUN 77* 71*  CREATININE 6.89* 6.59*  CALCIUM 8.0* 8.2*  PHOS 4.0 3.8   Liver Function Tests  Recent Labs  03/27/16 1858  ALBUMIN 2.9*   No results for input(s): LIPASE, AMYLASE in the last 72 hours. Cardiac Enzymes  Recent Labs  03/25/16 1331 03/25/16 1857  TROPONINI 0.06* 0.03*   BNP Invalid input(s): POCBNP D-Dimer No results for input(s): DDIMER in  the last 72 hours. Hemoglobin A1C No results for input(s): HGBA1C in the last 72 hours. Fasting Lipid Panel No results for input(s): CHOL, HDL, LDLCALC, TRIG, CHOLHDL, LDLDIRECT in the last 72 hours. Thyroid Function Tests No results for input(s): TSH, T4TOTAL, T3FREE, THYROIDAB in the last 72 hours.  Invalid input(s): FREET3  Telemetry    N/A   ECG    N/A  Radiology      Assessment & Plan    70 yo male with PMH of DM II, HTN, bilateral renal masses and CKD V who presented to the [facility] ED with reports of hypoglycemia and presyncope. Found to have a GI requiring transfusion and worsening renal disease. Now on HD.   1. Anemia s/p GI bleed: Going for endoscopy this morning. GI following. Hgb appears stable around 8.  2. Syncope: Likely 2/2 the above. No further episodes this admission. No longer on telemetry this morning. 2D echo is ordered but pending.   3. ESRD on HD: Nephrology following  Signed, Reino Bellis NP-C Pager [phone]   I agree with the note as outlined above.  Cardiac status is stable at this time.  Daryel November, MD

## 2016-03-28 NOTE — Progress Notes (Signed)
SLP Cancellation Note  Patient Details Name: Tim Armstrong MRN: [id number] DOB: 11-17-1945   Cancelled treatment:       Reason Eval/Treat Not Completed: Patient at procedure or test/unavailable. Pt NPO for endoscopy   Izamar Linden, Katherene Ponto 03/28/2016, 9:00 AM

## 2016-03-28 NOTE — Procedures (Signed)
Patient seen and examined on Hemodialysis.  Undergoing treatment #2.  Tolerated #1 well.   QB 300 mL/ min R TDC, UF goal 1L .  Treatment adjusted as needed.  Madelon Lips MD [facility]. Office # [phone] Pager # [phone] 3:07 PM

## 2016-03-28 NOTE — Progress Notes (Signed)
Jalene Mullet 10:23 AM  Subjective: Patient without any new complaints and ready for his procedure although his IVs and lines are bothering him a little  Objective: Vital signs stable afebrile no acute distress exam please see preassessment evaluation no new labs today  Assessment: Chronic anemia episodic guaiac positivity  Plan: Okay to proceed with endoscopy with anesthesia assistance  [facility]  Pager [phone] After 5PM or if no answer call [phone]

## 2016-03-28 NOTE — Progress Notes (Addendum)
[facility]  PROGRESS NOTE  CHRISTOPHR Armstrong [id number] DOB: [birth year] DOA: 03/24/2016 PCP: Philis Fendt, MD    Brief HPI:   70 y.o.malewith chronic kidney disease stage IV, hypertension, diabetes mellitus was brought to the ER from [place] he became lightheaded and near-syncopal. Blood sugar was in the 200s. Patient states over the last 2 weeks he hasbeen feeling fatigue and last 2 days he has been having multiple episodes of diarrhea.     In the ER patient's creatinine is found to be 7. Last creatinine in our system was around 4.7 in 2014 and at that time, he was recommended to have a fistula placed. He was lost to follow up. He was found also to have a Hb of 8 which has dropped to 6.9 with IVF given in ER.    Assessment/Plan:    1. CKD stage V-patient seen by nephrology, to initiating dialysis in a.m. Vascular surgery   consulted for tunneled catheter and AV Fistula which was placed on 9/13  . Patient is likely uremic with weight loss.Start CLIP process to [facility]. Received hemodialysis yesterday via right IJ catheter 2. Anemia with blood positive stool- gastroenterology has been consulted  , CT  abdomen and pelvis with oral contrast, shows worsening bilateral renal cysts, no hydronephrosis, cannot rule out pyelonephritis. Possible pneumonia in the right lower lobe. Patient has had 40 pound weight loss history. Hemoglobin is 8.6 after blood transfusion. GI recommends EGD this morning/outpatient colonoscopy to complete workup 3. Diabetes mellitus-hold Glucotrol,  glucose is well controlled, continue sliding scale insulin with NovoLog. 4. Hypertension-blood pressure is controlled, continue labetalol, Cardizem. 5. Diarrhea- stool for C. difficile is negative, we'll start Imodium when necessary for diarrhea 6. Syncope- likely from diarrhea, but patient has had 3 episodes of syncope in the recent past.   2-D echo pending, monitor on telemetry which shows  normal sinus rhythm 7. Elevated troponin- patient had mild elevation of troponin 0.06, which came down to 0.03, no chest pain. EKG showed nonspecific T-wave abnormality and T-wave inversions in lead V2 to V5 . 2-D echo pending. Cardiology consulted  for pre-syncope. Patient may need outpatient event monitor 8. probable HCAP-started on  cefepime and   Vancomycin, now switched to augmentin   DVT prophylaxis: Heparin Code Status: Full code Family Communication: No family at bedside Disposition Plan: Needs to be clipped, started on hemodialysis, EGD   Consultants:  Vascular surgery  Nephrology  GI  Cardiology  Procedures:  None   Antibiotics: Anti-infectives    Start     Dose/Rate Route Frequency Ordered Stop   03/27/16 2000  ceFEPIme (MAXIPIME) 1 g in dextrose 5 % 50 mL IVPB     1 g 100 mL/hr over 30 Minutes Intravenous Every 24 hours 03/27/16 1007     03/27/16 1615  vancomycin (VANCOCIN) 1,500 mg in sodium chloride 0.9 % 500 mL IVPB     1,500 mg 250 mL/hr over 120 Minutes Intravenous  Once 03/27/16 1603 03/27/16 1807   03/27/16 1030  vancomycin (VANCOCIN) 1,500 mg in sodium chloride 0.9 % 500 mL IVPB  Status:  Discontinued     1,500 mg 250 mL/hr over 120 Minutes Intravenous  Once 03/27/16 0948 03/27/16 1603   03/27/16 0600  cefUROXime (ZINACEF) 1.5 g in dextrose 5 % 50 mL IVPB     1.5 g 100 mL/hr over 30 Minutes Intravenous To [facility] 03/26/16 1003 03/27/16 1226       Denies LH , no melena ,  hematochezia     Objective    Objective: Vitals:   03/27/16 2110 03/27/16 2149 03/28/16 0446 03/28/16 0807  BP: (!) 165/83 (!) 174/77 (!) 145/68 (!) 160/68  Pulse: 83 89 83 85  Resp:  15 16 17   Temp:  97.5 F (36.4 C) 98.8 F (37.1 C) 98.5 F (36.9 C)  TempSrc:  Oral Oral Oral  SpO2:  100% 98% 99%  Weight:  65.3 kg (143 lb 15.4 oz)    Height:        Intake/Output Summary (Last 24 hours) at 03/28/16 0835 Last data filed at 03/28/16 0600  Gross per 24  hour  Intake              650 ml  Output              900 ml  Net             -250 ml   Filed Weights   03/27/16 1831 03/27/16 2106 03/27/16 2149  Weight: 65.3 kg (143 lb 15.4 oz) 65.3 kg (143 lb 15.4 oz) 65.3 kg (143 lb 15.4 oz)    Examination:  General exam: Appears calm and comfortable  Respiratory system: Clear to auscultation. Respiratory effort normal. Cardiovascular system: S1 & S2 heard, RRR. No JVD, murmurs, rubs, gallops or clicks. No pedal edema. Gastrointestinal system: Abdomen is nondistended, soft and nontender. No organomegaly or masses felt. Normal bowel sounds heard. Central nervous system: Alert and oriented. No focal neurological deficits. Extremities: Symmetric 5 x 5 power. Skin: No rashes, lesions or ulcers Psychiatry: Judgement and insight appear normal. Mood & affect appropriate.    Data Reviewed: I have personally reviewed following labs and imaging studies Basic Metabolic Panel:  Recent Labs Lab 03/24/16 1824 03/24/16 1850 03/25/16 0439 03/27/16 0517 03/27/16 1858  NA 139 136 136 136 138  K 4.2 4.0 3.3* 3.5 3.8  CL 101 103 104 106 106  CO2  --  21* 20* 22 23  GLUCOSE 237* 257* 89 119* 133*  BUN 79* 86* 82* 77* 71*  CREATININE 7.50* 7.46* 7.08* 6.89* 6.59*  CALCIUM  --  7.6* 7.3* 8.0* 8.2*  PHOS  --   --   --  4.0 3.8   Liver Function Tests:  Recent Labs Lab 03/24/16 1850 03/25/16 0439 03/27/16 1858  AST 21 16  --   ALT 12* 10*  --   ALKPHOS 132* 103  --   BILITOT 0.3 0.5  --   PROT 7.1 5.9*  --   ALBUMIN 3.8 3.1* 2.9*   No results for input(s): LIPASE, AMYLASE in the last 168 hours. No results for input(s): AMMONIA in the last 168 hours. CBC:  Recent Labs Lab 03/24/16 1850 03/25/16 0439 03/25/16 1331 03/27/16 0517 03/27/16 2014  WBC 7.4 4.7  --  5.3 7.1  NEUTROABS 5.8 2.7  --   --   --   HGB 8.4* 6.9* 8.2* 8.0* 8.6*  HCT 24.7* 20.5* 24.2* 24.3* 25.9*  MCV 73.5* 73.5*  --  75.2* 75.5*  PLT 214 190  --  171 143*    Cardiac Enzymes:  Recent Labs Lab 03/25/16 0727 03/25/16 1331 03/25/16 1857  TROPONINI 0.03* 0.06* 0.03*   BNP (last 3 results) No results for input(s): BNP in the last 8760 hours.  ProBNP (last 3 results) No results for input(s): PROBNP in the last 8760 hours.  CBG:  Recent Labs Lab 03/27/16 1029 03/27/16 1418 03/27/16 1620 03/27/16 2141 03/28/16 0752  GLUCAP 116* 121*  126* 118* 87    Recent Results (from the past 240 hour(s))  C difficile quick scan w PCR reflex     Status: None   Collection Time: 03/24/16 10:02 PM  Result Value Ref Range Status   C Diff antigen NEGATIVE NEGATIVE Final   C Diff toxin NEGATIVE NEGATIVE Final   C Diff interpretation No C. difficile detected.  Final  Gastrointestinal Panel by PCR , Stool     Status: None   Collection Time: 03/24/16 10:02 PM  Result Value Ref Range Status   Campylobacter species NOT DETECTED NOT DETECTED Final   Plesimonas shigelloides NOT DETECTED NOT DETECTED Final   Salmonella species NOT DETECTED NOT DETECTED Final   Yersinia enterocolitica NOT DETECTED NOT DETECTED Final   Vibrio species NOT DETECTED NOT DETECTED Final   Vibrio cholerae NOT DETECTED NOT DETECTED Final   Enteroaggregative E coli (EAEC) NOT DETECTED NOT DETECTED Final   Enteropathogenic E coli (EPEC) NOT DETECTED NOT DETECTED Final   Enterotoxigenic E coli (ETEC) NOT DETECTED NOT DETECTED Final   Shiga like toxin producing E coli (STEC) NOT DETECTED NOT DETECTED Final   E. coli O157 NOT DETECTED NOT DETECTED Final   Shigella/Enteroinvasive E coli (EIEC) NOT DETECTED NOT DETECTED Final   Cryptosporidium NOT DETECTED NOT DETECTED Final   Cyclospora cayetanensis NOT DETECTED NOT DETECTED Final   Entamoeba histolytica NOT DETECTED NOT DETECTED Final   Giardia lamblia NOT DETECTED NOT DETECTED Final   Adenovirus F40/41 NOT DETECTED NOT DETECTED Final   Astrovirus NOT DETECTED NOT DETECTED Final   Norovirus GI/GII NOT DETECTED NOT DETECTED Final    Rotavirus A NOT DETECTED NOT DETECTED Final   Sapovirus (I, II, IV, and V) NOT DETECTED NOT DETECTED Final     Studies: Ct Abdomen Pelvis Wo Contrast  Result Date: 03/26/2016 CLINICAL DATA:  Fatigue, diarrhea, and weight loss EXAM: CT ABDOMEN AND PELVIS WITHOUT CONTRAST TECHNIQUE: Multidetector CT imaging of the abdomen and pelvis was performed following the standard protocol without IV contrast. COMPARISON:  July 21, 2012 FINDINGS: Lower chest: There are small bilateral pleural effusions. Cardiomegaly is identified. The left lung base is clear. Mild opacity seen in the right lung base that is series 3, image 10. No suspicious masses Hepatobiliary: No focal liver abnormality is seen. No gallstones, gallbladder wall thickening, or biliary dilatation. Pancreas: Unremarkable. No pancreatic ductal dilatation or surrounding inflammatory changes. Spleen: Normal in size without focal abnormality. Adrenals/Urinary Tract: The adrenal glands are normal. Several tiny stones are seen in the kidneys. Numerous bilateral renal cysts are identified. There are also a few high attenuation rounded regions in the kidneys. These were noted to represent hyperdense cysts on a previous MRI. No suspicious solid masses identified. There is no hydronephrosis. There is moderate to severe perinephric stranding bilaterally which is symmetric. There was perinephric stranding in a similar distribution previously but the finding has worsened. The bladder is unremarkable. Stomach/Bowel: The stomach and small bowel are unremarkable. Colonic diverticulosis is seen without diverticulitis. The appendix is well seen with no appendicitis. Vascular/Lymphatic: Atherosclerotic changes seen in the non aneurysmal aorta. No adenopathy. Reproductive: The prostate is enlarged. Other: No free air or free fluid. Musculoskeletal: Degenerative changes seen in the thoracic and lumbar spine. No acute bony abnormalities. IMPRESSION: 1. Worsening cysts and  hyperdense cysts in the kidneys in the interval. There is no hydronephrosis to suggest obstruction. Nonobstructive renal stones. There is bilateral symmetric perinephric stranding which is moderate to severe and significantly worsened since the previous  study. However, the distribution of the stranding was similar on the previous study. I suspect the stranding is simply due to the underlying cause of renal insufficiency such as nephritis. The stranding could be seen with an acute process such as pyelonephritis but the symmetry and lack of obvious renal edema suggests this is less likely. Recommend clinical correlation and correlation with urinalysis. 2. Small bilateral pleural effusions.  Cardiomegaly. 3. Mild opacity in the right lung base may represent pneumonia. Recommend follow-up to resolution. 4. Atherosclerotic change in the aorta. Electronically Signed   By: Dorise Bullion III M.D   On: 03/26/2016 20:46   Dg Chest Port 1 View  Result Date: 03/27/2016 CLINICAL DATA:  Postoperative radiograph. Acute on chronic renal failure EXAM: PORTABLE CHEST 1 VIEW COMPARISON:  Portable chest x-ray of March 25, 2016 FINDINGS: The lungs are well-expanded and clear. There has been interval placement of a dual-lumen venous catheter via the right internal jugular approach. The tip overlies the midportion of the SVC. There is no pneumothorax or hemo thorax. The heart and pulmonary vascularity are normal. There is tortuosity of the ascending and descending thoracic aorta. IMPRESSION: No acute cardiopulmonary abnormality. Interval placement of a dual-lumen right internal jugular venous catheter. Electronically Signed   By: David  Martinique M.D.   On: 03/27/2016 15:53   Dg Fluoro Guide Cv Line-no Report  Result Date: 03/27/2016 CLINICAL DATA:  FLOURO GUIDE CV LINE Fluoroscopy was utilized by the requesting physician.  No radiographic interpretation.    Scheduled Meds: . calcitRIOL  0.25 mcg Oral Daily  . ceFEPime  (MAXIPIME) IV  1 g Intravenous Q24H  . cloNIDine  0.1 mg Oral BID  . darbepoetin (ARANESP) injection - DIALYSIS  40 mcg Intravenous Q Mon-HD  . diltiazem  240 mg Oral Daily  . feeding supplement (NEPRO CARB STEADY)  237 mL Oral BID BM  . [START ON 03/29/2016] ferric gluconate (FERRLECIT/NULECIT) IV  125 mg Intravenous Q M,W,F-HD  . heparin subcutaneous  5,000 Units Subcutaneous Q8H  . Influenza vac split quadrivalent PF  0.5 mL Intramuscular Tomorrow-1000  . insulin aspart  0-9 Units Subcutaneous TID WC  . labetalol  200 mg Oral BID   Continuous Infusions: . sodium chloride    . sodium chloride         Time spent: 25 min    [facility] Pager [phone]. If 7PM-7AM, please contact night-coverage at [email or web address], Office  [phone]  password [email or web address] 03/28/2016, 8:35 AM  LOS: 3 days

## 2016-03-28 NOTE — Progress Notes (Signed)
[facility] Progress Note   Subjective: "I'm just a little weak because I haven't had anything to eat".  For endoscopy this AM.   Objective Vitals:   03/27/16 2110 03/27/16 2149 03/28/16 0446 03/28/16 0807  BP: (!) 165/83 (!) 174/77 (!) 145/68 (!) 160/68  Pulse: 83 89 83 85  Resp:  15 16 17   Temp:  97.5 F (36.4 C) 98.8 F (37.1 C) 98.5 F (36.9 C)  TempSrc:  Oral Oral Oral  SpO2:  100% 98% 99%  Weight:  65.3 kg (143 lb 15.4 oz)    Height:        Physical Exam General: Pleasant, NAD Heart: S1,S2, RRR Lungs: BBS CTA A/P Abdomen: Soft, non-tender, active BS Extremities: No LE edema, pedal pulses intact.  Dialysis Access: new right sided PC and right AVF Strong bruit    Additional Objective Labs: Basic Metabolic Panel:  Recent Labs Lab 03/25/16 0439 03/27/16 0517 03/27/16 1858  NA 136 136 138  K 3.3* 3.5 3.8  CL 104 106 106  CO2 20* 22 23  GLUCOSE 89 119* 133*  BUN 82* 77* 71*  CREATININE 7.08* 6.89* 6.59*  CALCIUM 7.3* 8.0* 8.2*  PHOS  --  4.0 3.8   Liver Function Tests:  Recent Labs Lab 03/24/16 1850 03/25/16 0439 03/27/16 1858  AST 21 16  --   ALT 12* 10*  --   ALKPHOS 132* 103  --   BILITOT 0.3 0.5  --   PROT 7.1 5.9*  --   ALBUMIN 3.8 3.1* 2.9*   No results for input(s): LIPASE, AMYLASE in the last 168 hours. CBC:  Recent Labs Lab 03/24/16 1850 03/25/16 0439 03/25/16 1331 03/27/16 0517 03/27/16 2014  WBC 7.4 4.7  --  5.3 7.1  NEUTROABS 5.8 2.7  --   --   --   HGB 8.4* 6.9* 8.2* 8.0* 8.6*  HCT 24.7* 20.5* 24.2* 24.3* 25.9*  MCV 73.5* 73.5*  --  75.2* 75.5*  PLT 214 190  --  171 143*   Blood Culture    Component Value Date/Time   SDES BLOOD HAND RIGHT 08/02/2012 0840   SPECREQUEST BOTTLES DRAWN AEROBIC AND ANAEROBIC 10CC EACH 08/02/2012 0840   CULT NO GROWTH 5 DAYS 08/02/2012 0840   REPTSTATUS 08/08/2012 FINAL 08/02/2012 0840    Cardiac Enzymes:  Recent Labs Lab 03/25/16 0727 03/25/16 1331 03/25/16 1857   TROPONINI 0.03* 0.06* 0.03*   CBG:  Recent Labs Lab 03/27/16 1029 03/27/16 1418 03/27/16 1620 03/27/16 2141 03/28/16 0752  GLUCAP 116* 121* 126* 118* 87   Iron Studies: No results for input(s): IRON, TIBC, TRANSFERRIN, FERRITIN in the last 72 hours. @lablastinr3 @ Studies/Results: Ct Abdomen Pelvis Wo Contrast  Result Date: 03/26/2016 CLINICAL DATA:  Fatigue, diarrhea, and weight loss EXAM: CT ABDOMEN AND PELVIS WITHOUT CONTRAST TECHNIQUE: Multidetector CT imaging of the abdomen and pelvis was performed following the standard protocol without IV contrast. COMPARISON:  July 21, 2012 FINDINGS: Lower chest: There are small bilateral pleural effusions. Cardiomegaly is identified. The left lung base is clear. Mild opacity seen in the right lung base that is series 3, image 10. No suspicious masses Hepatobiliary: No focal liver abnormality is seen. No gallstones, gallbladder wall thickening, or biliary dilatation. Pancreas: Unremarkable. No pancreatic ductal dilatation or surrounding inflammatory changes. Spleen: Normal in size without focal abnormality. Adrenals/Urinary Tract: The adrenal glands are normal. Several tiny stones are seen in the kidneys. Numerous bilateral renal cysts are identified. There are also a few high attenuation rounded  regions in the kidneys. These were noted to represent hyperdense cysts on a previous MRI. No suspicious solid masses identified. There is no hydronephrosis. There is moderate to severe perinephric stranding bilaterally which is symmetric. There was perinephric stranding in a similar distribution previously but the finding has worsened. The bladder is unremarkable. Stomach/Bowel: The stomach and small bowel are unremarkable. Colonic diverticulosis is seen without diverticulitis. The appendix is well seen with no appendicitis. Vascular/Lymphatic: Atherosclerotic changes seen in the non aneurysmal aorta. No adenopathy. Reproductive: The prostate is enlarged. Other:  No free air or free fluid. Musculoskeletal: Degenerative changes seen in the thoracic and lumbar spine. No acute bony abnormalities. IMPRESSION: 1. Worsening cysts and hyperdense cysts in the kidneys in the interval. There is no hydronephrosis to suggest obstruction. Nonobstructive renal stones. There is bilateral symmetric perinephric stranding which is moderate to severe and significantly worsened since the previous study. However, the distribution of the stranding was similar on the previous study. I suspect the stranding is simply due to the underlying cause of renal insufficiency such as nephritis. The stranding could be seen with an acute process such as pyelonephritis but the symmetry and lack of obvious renal edema suggests this is less likely. Recommend clinical correlation and correlation with urinalysis. 2. Small bilateral pleural effusions.  Cardiomegaly. 3. Mild opacity in the right lung base may represent pneumonia. Recommend follow-up to resolution. 4. Atherosclerotic change in the aorta. Electronically Signed   By: Dorise Bullion III M.D   On: 03/26/2016 20:46   Dg Chest Port 1 View  Result Date: 03/27/2016 CLINICAL DATA:  Postoperative radiograph. Acute on chronic renal failure EXAM: PORTABLE CHEST 1 VIEW COMPARISON:  Portable chest x-ray of March 25, 2016 FINDINGS: The lungs are well-expanded and clear. There has been interval placement of a dual-lumen venous catheter via the right internal jugular approach. The tip overlies the midportion of the SVC. There is no pneumothorax or hemo thorax. The heart and pulmonary vascularity are normal. There is tortuosity of the ascending and descending thoracic aorta. IMPRESSION: No acute cardiopulmonary abnormality. Interval placement of a dual-lumen right internal jugular venous catheter. Electronically Signed   By: David  Martinique M.D.   On: 03/27/2016 15:53   Dg Fluoro Guide Cv Line-no Report  Result Date: 03/27/2016 CLINICAL DATA:  FLOURO GUIDE  CV LINE Fluoroscopy was utilized by the requesting physician.  No radiographic interpretation.   Medications: . sodium chloride    . sodium chloride     . calcitRIOL  0.25 mcg Oral Daily  . ceFEPime (MAXIPIME) IV  1 g Intravenous Q24H  . cloNIDine  0.1 mg Oral BID  . darbepoetin (ARANESP) injection - DIALYSIS  40 mcg Intravenous Q Mon-HD  . diltiazem  240 mg Oral Daily  . feeding supplement (NEPRO CARB STEADY)  237 mL Oral BID BM  . [START ON 03/29/2016] ferric gluconate (FERRLECIT/NULECIT) IV  125 mg Intravenous Q M,W,F-HD  . heparin subcutaneous  5,000 Units Subcutaneous Q8H  . Influenza vac split quadrivalent PF  0.5 mL Intramuscular Tomorrow-1000  . insulin aspart  0-9 Units Subcutaneous TID WC  . labetalol  200 mg Oral BID     Assessment/Plan:  1. CKD Stage V-now ESRD. Lost to F/U last visit with Dr. Florene Glen 3 years ago. 40lb wt loss. RUA AVF and   RIJ TDC placed 03/27/16 per Dr. Oneida Alar 1st HD 03/27/16. Started CLIP process to [facility]. Has been talked to about transplant and home dialysis options per Dr. Moshe Cipro. Scr  6.59 BUN 71 prior to HD yesterday. Recheck renal profile today.  2. HTN/volume - Multiple antihypertensive meds/clonidine/cardizem/labetolol. BP still high, no edema. HD yesterday pre wt 65.3 kg Net UF 500 Post wt 65.3 kg. Will have HD today (9/14) and again tomorrow (9/15) then resume Monday.  3. Anemia of CKD/also guiac + stools. Transfused. low tsat will start IV Fe and ESA. GI following. For endoscopy today. HGB down to 6.9 rec'd 1 unit PRBCs 03/25/16. Rec'd 150 mcg Aranesp 03/25/16. HGB 8.6 yesterday. Follow HGB  4. Secondary hyperparathyroidism -Ca 8.2 C Ca 9 Use 2.5 Ca bath check renal panel with HD. On daily calcitriol. Increase dose and change to dialysis days after he starts HD. PTH pending- phos 4.0- no binder yet 5. Nutrition - NPO for endoscopy.  6. DM: per primary.   Tim H. Brown NP-C 03/28/2016, 9:45 AM  [employer] [phone]     I have personally seen and examined this patient and agree with the assessment/plan as outlined above by Juanell Fairly.  New start ESRD- to have daily dialysis to complete treatment # 1, 2 and 3; treatment #4 planned 9/18. Herma Mering 03/28/2016 11:37 AM

## 2016-03-29 ENCOUNTER — Inpatient Hospital Stay (HOSPITAL_COMMUNITY): Payer: Medicare HMO

## 2016-03-29 DIAGNOSIS — I340 Nonrheumatic mitral (valve) insufficiency: Secondary | ICD-10-CM

## 2016-03-29 DIAGNOSIS — R634 Abnormal weight loss: Secondary | ICD-10-CM

## 2016-03-29 LAB — COMPREHENSIVE METABOLIC PANEL
ALBUMIN: 3.1 g/dL — AB (ref 3.5–5.0)
ALT: 14 U/L — AB (ref 17–63)
AST: 26 U/L (ref 15–41)
Alkaline Phosphatase: 111 U/L (ref 38–126)
Anion gap: 9 (ref 5–15)
BUN: 33 mg/dL — AB (ref 6–20)
CHLORIDE: 107 mmol/L (ref 101–111)
CO2: 24 mmol/L (ref 22–32)
CREATININE: 4.59 mg/dL — AB (ref 0.61–1.24)
Calcium: 8.7 mg/dL — ABNORMAL LOW (ref 8.9–10.3)
GFR calc Af Amer: 14 mL/min — ABNORMAL LOW (ref >60)
GFR, EST NON AFRICAN AMERICAN: 12 mL/min — AB (ref >60)
GLUCOSE: 94 mg/dL (ref 65–99)
POTASSIUM: 3.9 mmol/L (ref 3.5–5.1)
Sodium: 140 mmol/L (ref 135–145)
Total Bilirubin: 0.6 mg/dL (ref 0.3–1.2)
Total Protein: 6.4 g/dL — ABNORMAL LOW (ref 6.5–8.1)

## 2016-03-29 LAB — BASIC METABOLIC PANEL
ANION GAP: 8 (ref 5–15)
BUN: 32 mg/dL — ABNORMAL HIGH (ref 6–20)
CALCIUM: 8.5 mg/dL — AB (ref 8.9–10.3)
CO2: 24 mmol/L (ref 22–32)
CREATININE: 4.55 mg/dL — AB (ref 0.61–1.24)
Chloride: 106 mmol/L (ref 101–111)
GFR, EST AFRICAN AMERICAN: 14 mL/min — AB (ref >60)
GFR, EST NON AFRICAN AMERICAN: 12 mL/min — AB (ref >60)
Glucose, Bld: 110 mg/dL — ABNORMAL HIGH (ref 65–99)
Potassium: 3.7 mmol/L (ref 3.5–5.1)
SODIUM: 138 mmol/L (ref 135–145)

## 2016-03-29 LAB — CBC
HEMATOCRIT: 27.9 % — AB (ref 39.0–52.0)
Hemoglobin: 9.2 g/dL — ABNORMAL LOW (ref 13.0–17.0)
MCH: 25.3 pg — ABNORMAL LOW (ref 26.0–34.0)
MCHC: 33 g/dL (ref 30.0–36.0)
MCV: 76.6 fL — AB (ref 78.0–100.0)
PLATELETS: 141 10*3/uL — AB (ref 150–400)
RBC: 3.64 MIL/uL — AB (ref 4.22–5.81)
RDW: 16.6 % — AB (ref 11.5–15.5)
WBC: 8.4 10*3/uL (ref 4.0–10.5)

## 2016-03-29 LAB — GLUCOSE, CAPILLARY
GLUCOSE-CAPILLARY: 147 mg/dL — AB (ref 65–99)
GLUCOSE-CAPILLARY: 141 mg/dL — AB (ref 65–99)
GLUCOSE-CAPILLARY: 151 mg/dL — AB (ref 65–99)
Glucose-Capillary: 85 mg/dL (ref 65–99)

## 2016-03-29 LAB — PARATHYROID HORMONE, INTACT (NO CA): PTH: 375 pg/mL — AB (ref 15–65)

## 2016-03-29 LAB — ECHOCARDIOGRAM COMPLETE
Height: 71 in
Weight: 2155.22 oz

## 2016-03-29 MED ORDER — SODIUM CHLORIDE 0.9 % IV SOLN
125.0000 mg | INTRAVENOUS | Status: DC
  Filled 2016-03-29: qty 10

## 2016-03-29 MED ORDER — SODIUM CHLORIDE 0.9 % IV SOLN
125.0000 mg | INTRAVENOUS | Status: AC
  Administered 2016-03-30: 125 mg via INTRAVENOUS
  Filled 2016-03-29 (×2): qty 10

## 2016-03-29 NOTE — Progress Notes (Signed)
[facility] Progress Note   Assessment/Plan: 1. CKD Stage V-now ESRD. Lost to F/U last visit with Dr. Florene Glen 3 years ago. 40lb wt loss. RUA AVF and   RIJ TDC placed 03/27/16 per Dr. Oneida Alar 1st HD 03/27/16. Started CLIP process to [facility]. Has been talked to about transplant and home dialysis options per Dr. Moshe Cipro.  Had HD Wed and Thursday - plan postpone HD until Saturday - he is likely to start on at TTS schedule as an outpt 2. HTN/volume - Multiple antihypertensive meds/clonidine/cardizem/labetolol. BP still high, no edema. Net UF Wed 0.5 and 1.5 Thursday with post weight - HD again Sat and titrate EDW 3. Anemia of CKD/also guiac + stools. Transfused 1 unit for hgb 6.9 on 9/11. lsat  12% on course of  IV Fe and ESA. GI following. No dramatic findings on endo - for colonoscopy Sunday or Monday;  Rec'd 150 mcg Aranesp 03/25/16. Currently has 40 Aranesp ordered q Monday Hgb up to 9.2 4. Secondary hyperparathyroidism - iPTH 375  On 0.25 daily calcitriol no binders yet - change to TIW when dialysis days are confirmed 5. Nutrition -renal diet/vitamin 6. DM: per primary. 7. prob HCAP - on Augmentin  Myriam Jacobson, PA-C [facility] Beeper [phone] 03/29/2016,8:46 AM  LOS: 4 days   Subjective:     Objective Vitals:   03/28/16 1655 03/28/16 2047 03/29/16 0530 03/29/16 0801  BP: (!) 156/81 (!) 147/71 (!) 150/59 (!) 167/71  Pulse: 83 100 75 77  Resp: 18 20 20 18   Temp: 98 F (36.7 C) 99.2 F (37.3 C) 98.9 F (37.2 C) 99.1 F (37.3 C)  TempSrc: Oral   Oral  SpO2: 100% 100% 100% 100%  Weight: 61.1 kg (134 lb 11.2 oz)     Height:       Physical Exam General: thin  NAD Heart: RRR Lungs: no rales Abdomen: sof tNT Extremities: no edema Dialysis Access: right TDC right AVF + bruit   Additional Objective Labs: Basic Metabolic Panel:  Recent Labs Lab 03/27/16 0517 03/27/16 1858 03/28/16 0949 03/28/16 1255  03/29/16 0659  NA 136 138 138 141 140  K 3.5 3.8 3.9 3.9 3.9  CL 106 106 107 107 107  CO2 22 23 25 25 24   GLUCOSE 119* 133* 84 96 94  BUN 77* 71* 45* 46* 33*  CREATININE 6.89* 6.59* 5.11* 5.27* 4.59*  CALCIUM 8.0* 8.2* 8.2* 8.3* 8.7*  PHOS 4.0 3.8  --  3.3  --    Liver Function Tests:  Recent Labs Lab 03/25/16 0439  03/28/16 0949 03/28/16 1255 03/29/16 0659  AST 16  --  17  --  26  ALT 10*  --  10*  --  14*  ALKPHOS 103  --  101  --  111  BILITOT 0.5  --  0.7  --  0.6  PROT 5.9*  --  5.7*  --  6.4*  ALBUMIN 3.1*  < > 2.7* 2.9* 3.1*  < > = values in this interval not displayed. No results for input(s): LIPASE, AMYLASE in the last 168 hours. CBC:  Recent Labs Lab 03/24/16 1850 03/25/16 0439  03/27/16 0517 03/27/16 2014 03/28/16 0949 03/28/16 1255 03/29/16 0659  WBC 7.4 4.7  --  5.3 7.1 7.9 7.8 8.4  NEUTROABS 5.8 2.7  --   --   --   --   --   --   HGB 8.4* 6.9*  < > 8.0* 8.6* 8.6* 8.9* 9.2*  HCT 24.7* 20.5*  < > 24.3* 25.9* 26.2* 27.4* 27.9*  MCV 73.5* 73.5*  --  75.2* 75.5* 75.9* 76.5* 76.6*  PLT 214 190  --  171 143* 142* 152 141*  < > = values in this interval not displayed. Blood Culture    Component Value Date/Time   SDES BLOOD HAND RIGHT 08/02/2012 0840   SPECREQUEST BOTTLES DRAWN AEROBIC AND ANAEROBIC 10CC EACH 08/02/2012 0840   CULT NO GROWTH 5 DAYS 08/02/2012 0840   REPTSTATUS 08/08/2012 FINAL 08/02/2012 0840    Cardiac Enzymes:  Recent Labs Lab 03/25/16 0727 03/25/16 1331 03/25/16 1857  TROPONINI 0.03* 0.06* 0.03*   CBG:  Recent Labs Lab 03/28/16 0752 03/28/16 1217 03/28/16 1733 03/28/16 2046 03/29/16 0758  GLUCAP 87 77 125* 139* 85   Iron Studies: No results for input(s): IRON, TIBC, TRANSFERRIN, FERRITIN in the last 72 hours. No results found for: INR, PROTIME Studies/Results: Dg Chest Port 1 View  Result Date: 03/27/2016 CLINICAL DATA:  Postoperative radiograph. Acute on chronic renal failure EXAM: PORTABLE CHEST 1 VIEW  COMPARISON:  Portable chest x-ray of March 25, 2016 FINDINGS: The lungs are well-expanded and clear. There has been interval placement of a dual-lumen venous catheter via the right internal jugular approach. The tip overlies the midportion of the SVC. There is no pneumothorax or hemo thorax. The heart and pulmonary vascularity are normal. There is tortuosity of the ascending and descending thoracic aorta. IMPRESSION: No acute cardiopulmonary abnormality. Interval placement of a dual-lumen right internal jugular venous catheter. Electronically Signed   By: David  Martinique M.D.   On: 03/27/2016 15:53   Dg Fluoro Guide Cv Line-no Report  Result Date: 03/27/2016 CLINICAL DATA:  FLOURO GUIDE CV LINE Fluoroscopy was utilized by the requesting physician.  No radiographic interpretation.   Medications: . sodium chloride     . amoxicillin-clavulanate  2 tablet Oral QHS  . calcitRIOL  0.25 mcg Oral Daily  . cloNIDine  0.1 mg Oral BID  . darbepoetin (ARANESP) injection - DIALYSIS  40 mcg Intravenous Q Mon-HD  . diltiazem  240 mg Oral Daily  . feeding supplement (NEPRO CARB STEADY)  237 mL Oral BID BM  . ferric gluconate (FERRLECIT/NULECIT) IV  125 mg Intravenous Q M,W,F-HD  . heparin subcutaneous  5,000 Units Subcutaneous Q8H  . Influenza vac split quadrivalent PF  0.5 mL Intramuscular Tomorrow-1000  . insulin aspart  0-9 Units Subcutaneous TID WC  . labetalol  200 mg Oral BID  . pantoprazole  40 mg Oral Daily

## 2016-03-29 NOTE — Evaluation (Signed)
Occupational Therapy Evaluation Patient Details Name: Tim Armstrong MRN: [id number] DOB: 01/13/1946 Today's Date: 03/29/2016    History of Present Illness 70 y.o. male with chronic kidney disease stage IV, hypertension, diabetes mellitus was brought to the ER from [place] which he was visiting today after near syncope episode with blood sugar found to be in the 200s. s/p ESOPHAGOGASTRODUODENOSCOPY   Clinical Impression   Pt admitted with the above diagnoses and presents with below problem list. PTA pt was using cane and needing some assistance with ADLs recently due to increasing overall weakness/fatigue. Pt reporting "I'm starting to feel better now." Pt currently mod I to supervision with ADLs. Session details below. No further acute OT needs indicated at this time, OT signing off.     Follow Up Recommendations  No OT follow up    Equipment Recommendations  3 in 1 bedside comode    Recommendations for Other Services       Precautions / Restrictions Restrictions Weight Bearing Restrictions: No      Mobility Bed Mobility Overal bed mobility: Modified Independent                Transfers Overall transfer level: Needs assistance Equipment used: None Transfers: Sit to/from Stand Sit to Stand: Supervision         General transfer comment: from EOB, recliner, chair, and toilet    Balance Overall balance assessment: Needs assistance Sitting-balance support: No upper extremity supported;Feet supported Sitting balance-Leahy Scale: Good     Standing balance support: No upper extremity supported;During functional activity Standing balance-Leahy Scale: Good                              ADL Overall ADL's : Needs assistance/impaired Eating/Feeding: Set up;Sitting   Grooming: Set up;Supervision/safety;Sitting;Standing   Upper Body Bathing: Set up;Sitting   Lower Body Bathing: Supervison/ safety;Sit to/from stand   Upper Body Dressing : Set  up;Sitting   Lower Body Dressing: Supervision/safety;Sit to/from stand   Toilet Transfer: Supervision/safety;Ambulation;Grab bars   Toileting- Clothing Manipulation and Hygiene: Set up   Tub/ Shower Transfer: Supervision/safety;Tub transfer;Ambulation;3 in 1 Tub/Shower Transfer Details (indicate cue type and reason): simulated in room Functional mobility during ADLs: Supervision/safety General ADL Comments: Pt completed in-room functional mobility, toilet transfer, grooming task, and simulated tub transfer as detailed above.  ADL education and home setup discussed. Educated on energy conservation and provided handout.     Vision     Perception     Praxis      Pertinent Vitals/Pain Pain Assessment: 0-10 Pain Score: 5  Pain Location: BUE Pain Descriptors / Indicators: Sore Pain Intervention(s): Monitored during session;Repositioned;Ice applied     Hand Dominance     Extremity/Trunk Assessment Upper Extremity Assessment Upper Extremity Assessment: Overall WFL for tasks assessed   Lower Extremity Assessment Lower Extremity Assessment: Defer to PT evaluation   Cervical / Trunk Assessment Cervical / Trunk Assessment: Normal   Communication Communication Communication: No difficulties   Cognition Arousal/Alertness: Awake/alert Behavior During Therapy: WFL for tasks assessed/performed Overall Cognitive Status: Within Functional Limits for tasks assessed                     General Comments       Exercises       Shoulder Instructions      Home Living Family/patient expects to be discharged to:: Private residence Living Arrangements: Spouse/significant other Available Help at Discharge: Family;Available 24  hours/day Type of Home: House Home Access: Stairs to enter [employer] of Steps: 5 Entrance Stairs-Rails: Left Home Layout: One level     Bathroom Shower/Tub: Tub/shower unit Shower/tub characteristics: Curtain Biochemist, clinical:  Standard     Home Equipment: Cane - single point          Prior Functioning/Environment Level of Independence: Independent with assistive device(s);Needs assistance  Gait / Transfers Assistance Needed: cane ADL's / Homemaking Assistance Needed: some assistance with mobility and bathing/dressing in recent monthes due to increasing fatigue, "I feel better now."        OT Diagnosis:   AKI (acute kidney injury) ([place])  Dizziness - Plan: DG CHEST PORT 1 VIEW, DG CHEST PORT 1 VIEW  Weight loss, abnormal - Plan: CT ABDOMEN PELVIS WO CONTRAST, CT ABDOMEN PELVIS WO CONTRAST  Surgery, elective - Plan: DG Fluoro Guide CV Line-No Report, DG Fluoro Guide CV Line-No Report  Post-operative state - Plan: DG Chest Port 1 [place], DG Chest [place] 1 [place], CANCELED: DG Chest Port 1V same Day, CANCELED: DG Chest Port 1V same Day  Renal failure (ARF), acute on chronic (HCC)    OT Problem List: Impaired balance (sitting and/or standing);Decreased knowledge of use of DME or AE;Decreased knowledge of precautions   OT Treatment/Interventions:      OT Goals(Current goals can be found in the care plan section) Acute Rehab OT Goals Patient Stated Goal: keep feeling better OT Goal Formulation: With patient  OT Frequency:     Barriers to D/C:            Co-evaluation              End of Session Equipment Utilized During Treatment: Gait belt  Activity Tolerance: Patient tolerated treatment well Patient left: in chair;with call bell/phone within reach;Other (comment) (with PT)   Time: [id number] OT Time Calculation (min): 20 min Charges:  OT General Charges $OT Visit: 1 Procedure OT Evaluation $OT Eval Low Complexity: 1 Procedure G-Codes:    Hortencia Pilar 04/02/2016, 10:07 AM

## 2016-03-29 NOTE — Progress Notes (Signed)
  Echocardiogram 2D Echocardiogram has been performed.  Miciah Covelli 03/29/2016, 3:08 PM
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
## 2016-03-29 NOTE — Progress Notes (Signed)
[facility] Progress Note  Tim Armstrong 70 y.o. Oct 29, 1945   Subjective: Feels good. Sitting in bedside chair. No complaints.  Objective: Vital signs in last 24 hours: Vitals:   03/29/16 0530 03/29/16 0801  BP: (!) 150/59 (!) 167/71  Pulse: 75 77  Resp: 20 18  Temp: 98.9 F (37.2 C) 99.1 F (37.3 C)    Physical Exam: Gen: alert, no acute distress, thin CV: RRR Chest: CTA B Abd: soft, nontender, nondistended  Lab Results:  Recent Labs  03/27/16 1858  03/28/16 1255 03/29/16 0659 03/29/16 0856  NA 138  < > 141 140 138  K 3.8  < > 3.9 3.9 3.7  CL 106  < > 107 107 106  CO2 23  < > 25 24 24   GLUCOSE 133*  < > 96 94 110*  BUN 71*  < > 46* 33* 32*  CREATININE 6.59*  < > 5.27* 4.59* 4.55*  CALCIUM 8.2*  < > 8.3* 8.7* 8.5*  PHOS 3.8  --  3.3  --   --   < > = values in this interval not displayed.  Recent Labs  03/28/16 0949 03/28/16 1255 03/29/16 0659  AST 17  --  26  ALT 10*  --  14*  ALKPHOS 101  --  111  BILITOT 0.7  --  0.6  PROT 5.7*  --  6.4*  ALBUMIN 2.7* 2.9* 3.1*    Recent Labs  03/28/16 1255 03/29/16 0659  WBC 7.8 8.4  HGB 8.9* 9.2*  HCT 27.4* 27.9*  MCV 76.5* 76.6*  PLT 152 141*   No results for input(s): LABPROT, INR in the last 72 hours.    Assessment/Plan: Anemia with EGD unrevealing for a source of the anemia. Plan for inpt colonoscopy on Monday but timing to be determined by Dr. Alessandra Bevels who is on call this weekend. Continue renal diet until timing of colonoscopy known and then will need to change to clear liquid diet and order prep. Defer to Dr. Alessandra Bevels.   [place] C. 03/29/2016, 10:56 AM  Pager [phone]  If no answer or after 5 PM call [phone] ID: Tim Armstrong, male   DOB: [birth year], 70 y.o.   MRN: [id number]

## 2016-03-29 NOTE — Evaluation (Signed)
Physical Therapy Evaluation Patient Details Name: Tim Armstrong MRN: [id number] DOB: 11/30/1945 Today's Date: 03/29/2016   History of Present Illness  70 y.o. male with chronic kidney disease stage IV, hypertension, diabetes mellitus was admitted for R fistula placement and noted to have renal masses and pleural effusion, cardiomegaly, PNA.  Clinical Impression  Pt is up to walk with assistance of min guard to promote safety and to encourage his efforts to try steps and instruct ROM to legs.  He is motivated and will be good at home but needs PT to continue acutely for now to increase RLE strength and work on stair and gait endurance and control/independence.      Follow Up Recommendations Home health PT;Supervision for mobility/OOB    Equipment Recommendations  None recommended by PT    Recommendations for Other Services Rehab consult     Precautions / Restrictions Precautions Precautions: Fall Precaution Comments: telemetry Restrictions Weight Bearing Restrictions: No      Mobility  Bed Mobility Overal bed mobility: Modified Independent                Transfers Overall transfer level: Needs assistance Equipment used: 1 person hand held assist Transfers: Sit to/from Stand;Stand Pivot Transfers Sit to Stand: Supervision Stand pivot transfers: Min guard       General transfer comment: min guard from all surfaces, reminders for safety  Ambulation/Gait Ambulation/Gait assistance: Min guard Ambulation Distance (Feet): 250 Feet Assistive device: 1 person hand held assist Gait Pattern/deviations: Step-through pattern;Decreased stride length;Trunk flexed;Wide base of support Gait velocity: reduced Gait velocity interpretation: Below normal speed for age/gender General Gait Details: pt using care and able to maneuver with a small amount of extra time  Stairs Stairs: Yes Stairs assistance: Min guard Stair Management: One rail Right;Forwards;Alternating  pattern Number of Stairs: 5 General stair comments: progression with a little extra time so not to rush pt  Wheelchair Mobility    Modified Rankin (Stroke Patients Only)       Balance Overall balance assessment: Needs assistance Sitting-balance support: Feet supported Sitting balance-Leahy Scale: Good   Postural control: Posterior lean Standing balance support: Single extremity supported Standing balance-Leahy Scale: Fair                               Pertinent Vitals/Pain Pain Assessment: Faces Pain Score: 5  Faces Pain Scale: Hurts little more Pain Location: L arm crease Pain Descriptors / Indicators: Sore Pain Intervention(s): Limited activity within patient's tolerance;Monitored during session    Home Living Family/patient expects to be discharged to:: Private residence Living Arrangements: Spouse/significant other Available Help at Discharge: Family;Available 24 hours/day Type of Home: House Home Access: Stairs to enter Entrance Stairs-Rails: Left Entrance Stairs-Number of Steps: 5 Home Layout: One level Home Equipment: Cane - single point      Prior Function Level of Independence: Independent with assistive device(s)   Gait / Transfers Assistance Needed: SPC as needed, was weaker then  ADL's / Homemaking Assistance Needed: some assistance with mobility and bathing/dressing in recent monthes due to increasing fatigue, "I feel better now."        Hand Dominance        Extremity/Trunk Assessment   Upper Extremity Assessment: Overall WFL for tasks assessed           Lower Extremity Assessment: Overall WFL for tasks assessed      Cervical / Trunk Assessment: Normal  Communication   Communication: No  difficulties  Cognition Arousal/Alertness: Awake/alert Behavior During Therapy: WFL for tasks assessed/performed Overall Cognitive Status: Within Functional Limits for tasks assessed                      General Comments  General comments (skin integrity, edema, etc.): pt is up to walk with PT using care to avoid overdoing, and is not sure why RLE is less strong than LLE    Exercises     Assessment/Plan    PT Assessment Patient needs continued PT services  PT Problem List Decreased strength;Decreased range of motion;Decreased activity tolerance;Decreased balance;Decreased mobility;Decreased coordination;Decreased safety awareness;Decreased skin integrity;Pain;Cardiopulmonary status limiting activity       PT Diagnosis   AKI (acute kidney injury) ([place])  Dizziness - Plan: DG CHEST PORT 1 VIEW, DG CHEST PORT 1 VIEW  Weight loss, abnormal - Plan: CT ABDOMEN PELVIS WO CONTRAST, CT ABDOMEN PELVIS WO CONTRAST  Surgery, elective - Plan: DG Fluoro Guide CV Line-No Report, DG Fluoro Guide CV Line-No Report  Post-operative state - Plan: DG Chest Port 1 [place], DG Chest [place] 1 [place], CANCELED: DG Chest Port 1V same Day, CANCELED: DG Chest Port 1V same Day  Renal failure (ARF), acute on chronic (HCC)    PT Treatment Interventions DME instruction;Gait training;Functional mobility training;Stair training;Therapeutic activities;Therapeutic exercise;Balance training;Neuromuscular re-education;Patient/family education    PT Goals (Current goals can be found in the Care Plan section)  Acute Rehab PT Goals Patient Stated Goal: keep feeling better PT Goal Formulation: With patient Time For Goal Achievement: 04/12/16 Potential to Achieve Goals: Good    Frequency Min 3X/week   Barriers to discharge Other (comment) (will need assistance at home)      Co-evaluation               End of Session   Activity Tolerance: No increased pain;Patient limited by fatigue Patient left: in chair;with call bell/phone within reach Nurse Communication: Mobility status;Precautions         Time: [id number] PT Time Calculation (min) (ACUTE ONLY): 22 min   Charges:   PT Evaluation $PT Eval Moderate Complexity: 1  Procedure     PT G CodesRamond Dial 04/11/16, 11:27 AM   Mee Hives, PT MS Acute Rehab Dept. Number: [place] and [place]

## 2016-03-29 NOTE — Progress Notes (Addendum)
[facility]  PROGRESS NOTE  Tim Armstrong [id number] DOB: 1946/04/18 DOA: 03/24/2016 PCP: Philis Fendt, MD    Brief HPI:   70 y.o.malewith chronic kidney disease stage IV, hypertension, diabetes mellitus was brought to the ER from [place] he became lightheaded and near-syncopal. Blood sugar was in the 200s. Patient states over the last 2 weeks he hasbeen feeling fatigue and last 2 days he has been having multiple episodes of diarrhea.     In the ER patient's creatinine is found to be 7. Last creatinine in our system was around 4.7 in 2014 and at that time, he was recommended to have a fistula placed. He was lost to follow up. He was found also to have a Hb of 8 which has dropped to 6.9 with IVF given in ER.    Assessment/Plan:    1. CKD stage V- Now ESRD. Vascular surgery   consulted for tunneled catheter and AV Fistula which was placed on 9/13  . Patient is likely uremic with weight loss.started hemodialysis 9/13. Started CLIP process to [facility]. Received hemodialysis starting 9/13 via right IJ catheter  By Elam Dutch, MD.   2. Anemia with blood positive stool- gastroenterology has been consulted  , CT  abdomen and pelvis with oral contrast, shows worsening bilateral renal cysts, no hydronephrosis, cannot rule out pyelonephritis. Possible pneumonia in the right lower lobe. Patient has had 40 pound weight loss history. Hemoglobin is 9.2 and improving after blood transfusion. EGD on 9/14 showed erosive gastropathy, nonbleeding. Colonoscopy early next week. Continue PPI.Received 1 unit PRBCs 03/25/16. Received 150 mcg Aranesp 03/25/16.   3. Diabetes mellitus-hold Glucotrol,  glucose is well controlled, continue sliding scale insulin with NovoLog.   4. Hypertension-blood pressure is controlled, continue clonidine, labetalol, Cardizem,   5. Diarrhea- stool for C. difficile is negative, continue Imodium when necessary for diarrhea   6. Syncope-  likely from diarrhea, but patient has had 3 episodes of syncope in the recent past.   2-D echo pending since 9/12, monitor on telemetry which shows normal sinus rhythm   7. Elevated troponin- patient had mild elevation of troponin 0.06, which came down to 0.03, no chest pain. EKG showed nonspecific T-wave abnormality and T-wave inversions in lead V2 to V5 . 2-D echo pending. Cardiology consulted  for pre-syncope. Patient may need outpatient event monitor   8. probable HCAP-started on  cefepime and   Vancomycin, now switched to augmentin   DVT prophylaxis: Heparin Code Status: Full code Family Communication: No family at bedside Disposition Plan: Needs to be clipped, started on hemodialysis, EGD   Consultants:  Vascular surgery  Nephrology  GI  Cardiology  Procedures:  None   Antibiotics: Anti-infectives    Start     Dose/Rate Route Frequency Ordered Stop   03/28/16 2200  amoxicillin-clavulanate (AUGMENTIN) 500-125 MG per tablet 1,000 mg     2 tablet Oral Daily at bedtime 03/28/16 1035 04/01/16 2159   03/27/16 2000  ceFEPIme (MAXIPIME) 1 g in dextrose 5 % 50 mL IVPB  Status:  Discontinued     1 g 100 mL/hr over 30 Minutes Intravenous Every 24 hours 03/27/16 1007 03/28/16 1035   03/27/16 1615  vancomycin (VANCOCIN) 1,500 mg in sodium chloride 0.9 % 500 mL IVPB     1,500 mg 250 mL/hr over 120 Minutes Intravenous  Once 03/27/16 1603 03/27/16 1807   03/27/16 1030  vancomycin (VANCOCIN) 1,500 mg in sodium chloride 0.9 % 500 mL IVPB  Status:  Discontinued     1,500 mg 250 mL/hr over 120 Minutes Intravenous  Once 03/27/16 0948 03/27/16 1603   03/27/16 0600  cefUROXime (ZINACEF) 1.5 g in dextrose 5 % 50 mL IVPB     1.5 g 100 mL/hr over 30 Minutes Intravenous To ShortStay Surgical 03/26/16 1003 03/27/16 1226          Subjective-patient states that his appetite is improving, he feels more energetic  Objective    Objective: Vitals:   03/28/16 1655 03/28/16 2047 03/29/16  0530 03/29/16 0801  BP: (!) 156/81 (!) 147/71 (!) 150/59 (!) 167/71  Pulse: 83 100 75 77  Resp: 18 20 20 18   Temp: 98 F (36.7 C) 99.2 F (37.3 C) 98.9 F (37.2 C) 99.1 F (37.3 C)  TempSrc: Oral   Oral  SpO2: 100% 100% 100% 100%  Weight: 61.1 kg (134 lb 11.2 oz)     Height:        Intake/Output Summary (Last 24 hours) at 03/29/16 0817 Last data filed at 03/29/16 0600  Gross per 24 hour  Intake             1160 ml  Output             1100 ml  Net               60 ml   Filed Weights   03/27/16 2149 03/28/16 1330 03/28/16 1655  Weight: 65.3 kg (143 lb 15.4 oz) 62.4 kg (137 lb 9.1 oz) 61.1 kg (134 lb 11.2 oz)    Examination:  General exam: Appears calm and comfortable  Respiratory system: Clear to auscultation. Respiratory effort normal. Cardiovascular system: S1 & S2 heard, RRR. No JVD, murmurs, rubs, gallops or clicks. No pedal edema. Gastrointestinal system: Abdomen is nondistended, soft and nontender. No organomegaly or masses felt. Normal bowel sounds heard. Central nervous system: Alert and oriented. No focal neurological deficits. Extremities: Symmetric 5 x 5 power. Skin: No rashes, lesions or ulcers Psychiatry: Judgement and insight appear normal. Mood & affect appropriate.    Data Reviewed: I have personally reviewed following labs and imaging studies Basic Metabolic Panel:  Recent Labs Lab 03/25/16 0439 03/27/16 0517 03/27/16 1858 03/28/16 0949 03/28/16 1255  NA 136 136 138 138 141  K 3.3* 3.5 3.8 3.9 3.9  CL 104 106 106 107 107  CO2 20* 22 23 25 25   GLUCOSE 89 119* 133* 84 96  BUN 82* 77* 71* 45* 46*  CREATININE 7.08* 6.89* 6.59* 5.11* 5.27*  CALCIUM 7.3* 8.0* 8.2* 8.2* 8.3*  PHOS  --  4.0 3.8  --  3.3   Liver Function Tests:  Recent Labs Lab 03/24/16 1850 03/25/16 0439 03/27/16 1858 03/28/16 0949 03/28/16 1255  AST 21 16  --  17  --   ALT 12* 10*  --  10*  --   ALKPHOS 132* 103  --  101  --   BILITOT 0.3 0.5  --  0.7  --   PROT 7.1 5.9*   --  5.7*  --   ALBUMIN 3.8 3.1* 2.9* 2.7* 2.9*   No results for input(s): LIPASE, AMYLASE in the last 168 hours. No results for input(s): AMMONIA in the last 168 hours. CBC:  Recent Labs Lab 03/24/16 1850 03/25/16 0439  03/27/16 0517 03/27/16 2014 03/28/16 0949 03/28/16 1255 03/29/16 0659  WBC 7.4 4.7  --  5.3 7.1 7.9 7.8 8.4  NEUTROABS 5.8 2.7  --   --   --   --   --   --  HGB 8.4* 6.9*  < > 8.0* 8.6* 8.6* 8.9* 9.2*  HCT 24.7* 20.5*  < > 24.3* 25.9* 26.2* 27.4* 27.9*  MCV 73.5* 73.5*  --  75.2* 75.5* 75.9* 76.5* 76.6*  PLT 214 190  --  171 143* 142* 152 141*  < > = values in this interval not displayed. Cardiac Enzymes:  Recent Labs Lab 03/25/16 0727 03/25/16 1331 03/25/16 1857  TROPONINI 0.03* 0.06* 0.03*   BNP (last 3 results) No results for input(s): BNP in the last 8760 hours.  ProBNP (last 3 results) No results for input(s): PROBNP in the last 8760 hours.  CBG:  Recent Labs Lab 03/28/16 0752 03/28/16 1217 03/28/16 1733 03/28/16 2046 03/29/16 0758  GLUCAP 87 77 125* 139* 85    Recent Results (from the past 240 hour(s))  C difficile quick scan w PCR reflex     Status: None   Collection Time: 03/24/16 10:02 PM  Result Value Ref Range Status   C Diff antigen NEGATIVE NEGATIVE Final   C Diff toxin NEGATIVE NEGATIVE Final   C Diff interpretation No C. difficile detected.  Final  Gastrointestinal Panel by PCR , Stool     Status: None   Collection Time: 03/24/16 10:02 PM  Result Value Ref Range Status   Campylobacter species NOT DETECTED NOT DETECTED Final   Plesimonas shigelloides NOT DETECTED NOT DETECTED Final   Salmonella species NOT DETECTED NOT DETECTED Final   Yersinia enterocolitica NOT DETECTED NOT DETECTED Final   Vibrio species NOT DETECTED NOT DETECTED Final   Vibrio cholerae NOT DETECTED NOT DETECTED Final   Enteroaggregative E coli (EAEC) NOT DETECTED NOT DETECTED Final   Enteropathogenic E coli (EPEC) NOT DETECTED NOT DETECTED Final    Enterotoxigenic E coli (ETEC) NOT DETECTED NOT DETECTED Final   Shiga like toxin producing E coli (STEC) NOT DETECTED NOT DETECTED Final   E. coli O157 NOT DETECTED NOT DETECTED Final   Shigella/Enteroinvasive E coli (EIEC) NOT DETECTED NOT DETECTED Final   Cryptosporidium NOT DETECTED NOT DETECTED Final   Cyclospora cayetanensis NOT DETECTED NOT DETECTED Final   Entamoeba histolytica NOT DETECTED NOT DETECTED Final   Giardia lamblia NOT DETECTED NOT DETECTED Final   Adenovirus F40/41 NOT DETECTED NOT DETECTED Final   Astrovirus NOT DETECTED NOT DETECTED Final   Norovirus GI/GII NOT DETECTED NOT DETECTED Final   Rotavirus A NOT DETECTED NOT DETECTED Final   Sapovirus (I, II, IV, and V) NOT DETECTED NOT DETECTED Final     Studies: Dg Chest Port 1 View  Result Date: 03/27/2016 CLINICAL DATA:  Postoperative radiograph. Acute on chronic renal failure EXAM: PORTABLE CHEST 1 VIEW COMPARISON:  Portable chest x-ray of March 25, 2016 FINDINGS: The lungs are well-expanded and clear. There has been interval placement of a dual-lumen venous catheter via the right internal jugular approach. The tip overlies the midportion of the SVC. There is no pneumothorax or hemo thorax. The heart and pulmonary vascularity are normal. There is tortuosity of the ascending and descending thoracic aorta. IMPRESSION: No acute cardiopulmonary abnormality. Interval placement of a dual-lumen right internal jugular venous catheter. Electronically Signed   By: David  Martinique M.D.   On: 03/27/2016 15:53   Dg Fluoro Guide Cv Line-no Report  Result Date: 03/27/2016 CLINICAL DATA:  FLOURO GUIDE CV LINE Fluoroscopy was utilized by the requesting physician.  No radiographic interpretation.    Scheduled Meds: . amoxicillin-clavulanate  2 tablet Oral QHS  . calcitRIOL  0.25 mcg Oral Daily  . cloNIDine  0.1 mg Oral BID  . darbepoetin (ARANESP) injection - DIALYSIS  40 mcg Intravenous Q Mon-HD  . diltiazem  240 mg Oral Daily  .  feeding supplement (NEPRO CARB STEADY)  237 mL Oral BID BM  . ferric gluconate (FERRLECIT/NULECIT) IV  125 mg Intravenous Q M,W,F-HD  . heparin subcutaneous  5,000 Units Subcutaneous Q8H  . Influenza vac split quadrivalent PF  0.5 mL Intramuscular Tomorrow-1000  . insulin aspart  0-9 Units Subcutaneous TID WC  . labetalol  200 mg Oral BID  . pantoprazole  40 mg Oral Daily   Continuous Infusions: . sodium chloride         Time spent: 25 min    [facility] Pager [phone]. If 7PM-7AM, please contact night-coverage at [email or web address], Office  [phone]  password [email or web address] 03/29/2016, 8:17 AM  LOS: 4 days

## 2016-03-30 DIAGNOSIS — Z9889 Other specified postprocedural states: Secondary | ICD-10-CM

## 2016-03-30 LAB — RENAL FUNCTION PANEL
Albumin: 2.8 g/dL — ABNORMAL LOW (ref 3.5–5.0)
Anion gap: 10 (ref 5–15)
BUN: 37 mg/dL — ABNORMAL HIGH (ref 6–20)
CO2: 26 mmol/L (ref 22–32)
Calcium: 8.4 mg/dL — ABNORMAL LOW (ref 8.9–10.3)
Chloride: 103 mmol/L (ref 101–111)
Creatinine, Ser: 5.21 mg/dL — ABNORMAL HIGH (ref 0.61–1.24)
GFR calc Af Amer: 12 mL/min — ABNORMAL LOW (ref >60)
GFR calc non Af Amer: 10 mL/min — ABNORMAL LOW (ref >60)
Glucose, Bld: 141 mg/dL — ABNORMAL HIGH (ref 65–99)
Phosphorus: 2.6 mg/dL (ref 2.5–4.6)
Potassium: 3.4 mmol/L — ABNORMAL LOW (ref 3.5–5.1)
Sodium: 139 mmol/L (ref 135–145)

## 2016-03-30 LAB — CBC
HCT: 24.3 % — ABNORMAL LOW (ref 39.0–52.0)
Hemoglobin: 8 g/dL — ABNORMAL LOW (ref 13.0–17.0)
MCH: 25.5 pg — ABNORMAL LOW (ref 26.0–34.0)
MCHC: 32.9 g/dL (ref 30.0–36.0)
MCV: 77.4 fL — ABNORMAL LOW (ref 78.0–100.0)
Platelets: 123 10*3/uL — ABNORMAL LOW (ref 150–400)
RBC: 3.14 MIL/uL — ABNORMAL LOW (ref 4.22–5.81)
RDW: 16.8 % — ABNORMAL HIGH (ref 11.5–15.5)
WBC: 7.3 10*3/uL (ref 4.0–10.5)

## 2016-03-30 LAB — GLUCOSE, CAPILLARY
GLUCOSE-CAPILLARY: 83 mg/dL (ref 65–99)
Glucose-Capillary: 110 mg/dL — ABNORMAL HIGH (ref 65–99)
Glucose-Capillary: 203 mg/dL — ABNORMAL HIGH (ref 65–99)
Glucose-Capillary: 155 mg/dL — ABNORMAL HIGH (ref 65–99)

## 2016-03-30 NOTE — Progress Notes (Signed)
[facility]  PROGRESS NOTE  Tim Armstrong [id number] DOB: September 17, 1945 DOA: 03/24/2016 PCP: Philis Fendt, MD    Brief HPI:   70 y.o.malewith chronic kidney disease stage IV, hypertension, diabetes mellitus was brought to the ER from [place] he became lightheaded and near-syncopal. Blood sugar was in the 200s. Patient states over the last 2 weeks he hasbeen feeling fatigue and last 2 days he has been having multiple episodes of diarrhea.     In the ER patient's creatinine is found to be 7. Last creatinine in our system was around 4.7 in 2014 and at that time, he was recommended to have a fistula placed. He was lost to follow up. He was found also to have a Hb of 8 which has dropped to 6.9 with IVF given in ER.    Assessment/Plan:    1. CKD stage V- Now ESRD. Vascular surgery   consulted for tunneled catheter and AV Fistula which was placed on 9/13  . Patient  likely uremic with weight loss.started hemodialysis 9/13. Started CLIP process to [facility]. Receiving hemodialysis starting 9/13 via right IJ catheter  By Elam Dutch, MD.he is likely to start on at [facility] schedule as an outpt   2. Anemia with blood positive stool- gastroenterology has been consulted  , CT  abdomen and pelvis with oral contrast, shows worsening bilateral renal cysts, no hydronephrosis, cannot rule out pyelonephritis. Possible pneumonia in the right lower lobe. Patient has had 40 pound weight loss history. Hemoglobin is 9.2 and improving after blood transfusion. EGD on 9/14 showed erosive gastropathy, nonbleeding.  Continue PPI.Received 1 unit PRBCs 03/25/16. Received 150 mcg Aranesp 03/25/16.Plan for inpt colonoscopy on Monday but timing to be determined by Dr. Alessandra Bevels who is on call this weekend.    3. Diabetes mellitus-hold Glucotrol,  glucose is well controlled, continue sliding scale insulin with NovoLog.   4. Hypertension-blood pressure is controlled, continue clonidine,  labetalol, Cardizem,   5. Diarrhea- stool for C. difficile is negative, continue Imodium when necessary for diarrhea   6. Syncope- likely from diarrhea, but patient has had 3 episodes of syncope in the recent past.   2-D echo pending since 9/12, monitor on telemetry which shows normal sinus rhythm   7. Elevated troponin- patient had mild elevation of troponin 0.06, which came down to 0.03, no chest pain. EKG showed nonspecific T-wave abnormality and T-wave inversions in lead V2 to V5 . 2-D echo pending. Cardiology consulted  for pre-syncope. Patient may need outpatient event monitor   8. probable HCAP-started on  cefepime and   Vancomycin, now switched to augmentin   DVT prophylaxis: Heparin Code Status: Full code Family Communication: No family at bedside Disposition Plan: Needs to be clipped, started on hemodialysis,  colonoscopy next week   Consultants:  Vascular surgery  Nephrology  GI  Cardiology  Procedures:  EGD 9/14  Ultrasound of neck, Right Internal jugular vein Palindrome catheter, Right Brachial Cephalic AV fistula [id number]  Antibiotics: Anti-infectives    Start     Dose/Rate Route Frequency Ordered Stop   03/28/16 2200  amoxicillin-clavulanate (AUGMENTIN) 500-125 MG per tablet 1,000 mg     2 tablet Oral Daily at bedtime 03/28/16 1035 04/01/16 2159   03/27/16 2000  ceFEPIme (MAXIPIME) 1 g in dextrose 5 % 50 mL IVPB  Status:  Discontinued     1 g 100 mL/hr over 30 Minutes Intravenous Every 24 hours 03/27/16 1007 03/28/16 1035   03/27/16 1615  vancomycin (VANCOCIN)  1,500 mg in sodium chloride 0.9 % 500 mL IVPB     1,500 mg 250 mL/hr over 120 Minutes Intravenous  Once 03/27/16 1603 03/27/16 1807   03/27/16 1030  vancomycin (VANCOCIN) 1,500 mg in sodium chloride 0.9 % 500 mL IVPB  Status:  Discontinued     1,500 mg 250 mL/hr over 120 Minutes Intravenous  Once 03/27/16 0948 03/27/16 1603   03/27/16 0600  cefUROXime (ZINACEF) 1.5 g in dextrose 5 % 50 mL IVPB      1.5 g 100 mL/hr over 30 Minutes Intravenous To ShortStay Surgical 03/26/16 1003 03/27/16 1226          Subjective-patient states that his appetite is improving, he feels more energetic  Objective    Objective: Vitals:   03/30/16 0800 03/30/16 0830 03/30/16 0900 03/30/16 0930  BP: (!) 141/73 138/71 136/76   Pulse: 68 73 79 80  Resp: 17 (!) 23 18   Temp:      TempSrc:      SpO2:      Weight:      Height:        Intake/Output Summary (Last 24 hours) at 03/30/16 1026 Last data filed at 03/30/16 1007  Gross per 24 hour  Intake              840 ml  Output              450 ml  Net              390 ml   Filed Weights   03/28/16 1655 03/29/16 2033 03/30/16 0703  Weight: 61.1 kg (134 lb 11.2 oz) 59 kg (130 lb 1.1 oz) 60.6 kg (133 lb 9.6 oz)    Examination:  General exam: Appears calm and comfortable  Respiratory system: Clear to auscultation. Respiratory effort normal. Cardiovascular system: S1 & S2 heard, RRR. No JVD, murmurs, rubs, gallops or clicks. No pedal edema. Gastrointestinal system: Abdomen is nondistended, soft and nontender. No organomegaly or masses felt. Normal bowel sounds heard. Central nervous system: Alert and oriented. No focal neurological deficits. Extremities: Symmetric 5 x 5 power. Skin: No rashes, lesions or ulcers Psychiatry: Judgement and insight appear normal. Mood & affect appropriate.    Data Reviewed: I have personally reviewed following labs and imaging studies Basic Metabolic Panel:  Recent Labs Lab 03/27/16 0517 03/27/16 1858 03/28/16 0949 03/28/16 1255 03/29/16 0659 03/29/16 0856 03/30/16 0718  NA 136 138 138 141 140 138 139  K 3.5 3.8 3.9 3.9 3.9 3.7 3.4*  CL 106 106 107 107 107 106 103  CO2 22 23 25 25 24 24 26   GLUCOSE 119* 133* 84 96 94 110* 141*  BUN 77* 71* 45* 46* 33* 32* 37*  CREATININE 6.89* 6.59* 5.11* 5.27* 4.59* 4.55* 5.21*  CALCIUM 8.0* 8.2* 8.2* 8.3* 8.7* 8.5* 8.4*  PHOS 4.0 3.8  --  3.3  --   --  2.6   Liver  Function Tests:  Recent Labs Lab 03/24/16 1850 03/25/16 0439 03/27/16 1858 03/28/16 0949 03/28/16 1255 03/29/16 0659 03/30/16 0718  AST 21 16  --  17  --  26  --   ALT 12* 10*  --  10*  --  14*  --   ALKPHOS 132* 103  --  101  --  111  --   BILITOT 0.3 0.5  --  0.7  --  0.6  --   PROT 7.1 5.9*  --  5.7*  --  6.4*  --  ALBUMIN 3.8 3.1* 2.9* 2.7* 2.9* 3.1* 2.8*   No results for input(s): LIPASE, AMYLASE in the last 168 hours. No results for input(s): AMMONIA in the last 168 hours. CBC:  Recent Labs Lab 03/24/16 1850 03/25/16 0439  03/27/16 2014 03/28/16 0949 03/28/16 1255 03/29/16 0659 03/30/16 0700  WBC 7.4 4.7  < > 7.1 7.9 7.8 8.4 7.3  NEUTROABS 5.8 2.7  --   --   --   --   --   --   HGB 8.4* 6.9*  < > 8.6* 8.6* 8.9* 9.2* 8.0*  HCT 24.7* 20.5*  < > 25.9* 26.2* 27.4* 27.9* 24.3*  MCV 73.5* 73.5*  < > 75.5* 75.9* 76.5* 76.6* 77.4*  PLT 214 190  < > 143* 142* 152 141* 123*  < > = values in this interval not displayed. Cardiac Enzymes:  Recent Labs Lab 03/25/16 0727 03/25/16 1331 03/25/16 1857  TROPONINI 0.03* 0.06* 0.03*   BNP (last 3 results) No results for input(s): BNP in the last 8760 hours.  ProBNP (last 3 results) No results for input(s): PROBNP in the last 8760 hours.  CBG:  Recent Labs Lab 03/28/16 2046 03/29/16 0758 03/29/16 1149 03/29/16 1655 03/29/16 2030  GLUCAP 139* 85 147* 141* 151*    Recent Results (from the past 240 hour(s))  C difficile quick scan w PCR reflex     Status: None   Collection Time: 03/24/16 10:02 PM  Result Value Ref Range Status   C Diff antigen NEGATIVE NEGATIVE Final   C Diff toxin NEGATIVE NEGATIVE Final   C Diff interpretation No C. difficile detected.  Final  Gastrointestinal Panel by PCR , Stool     Status: None   Collection Time: 03/24/16 10:02 PM  Result Value Ref Range Status   Campylobacter species NOT DETECTED NOT DETECTED Final   Plesimonas shigelloides NOT DETECTED NOT DETECTED Final   Salmonella  species NOT DETECTED NOT DETECTED Final   Yersinia enterocolitica NOT DETECTED NOT DETECTED Final   Vibrio species NOT DETECTED NOT DETECTED Final   Vibrio cholerae NOT DETECTED NOT DETECTED Final   Enteroaggregative E coli (EAEC) NOT DETECTED NOT DETECTED Final   Enteropathogenic E coli (EPEC) NOT DETECTED NOT DETECTED Final   Enterotoxigenic E coli (ETEC) NOT DETECTED NOT DETECTED Final   Shiga like toxin producing E coli (STEC) NOT DETECTED NOT DETECTED Final   E. coli O157 NOT DETECTED NOT DETECTED Final   Shigella/Enteroinvasive E coli (EIEC) NOT DETECTED NOT DETECTED Final   Cryptosporidium NOT DETECTED NOT DETECTED Final   Cyclospora cayetanensis NOT DETECTED NOT DETECTED Final   Entamoeba histolytica NOT DETECTED NOT DETECTED Final   Giardia lamblia NOT DETECTED NOT DETECTED Final   Adenovirus F40/41 NOT DETECTED NOT DETECTED Final   Astrovirus NOT DETECTED NOT DETECTED Final   Norovirus GI/GII NOT DETECTED NOT DETECTED Final   Rotavirus A NOT DETECTED NOT DETECTED Final   Sapovirus (I, II, IV, and V) NOT DETECTED NOT DETECTED Final     Studies: No results found.  Scheduled Meds: . amoxicillin-clavulanate  2 tablet Oral QHS  . calcitRIOL  0.25 mcg Oral Daily  . cloNIDine  0.1 mg Oral BID  . darbepoetin (ARANESP) injection - DIALYSIS  40 mcg Intravenous Q Mon-HD  . diltiazem  240 mg Oral Daily  . feeding supplement (NEPRO CARB STEADY)  237 mL Oral BID BM  . [START ON 04/01/2016] ferric gluconate (FERRLECIT/NULECIT) IV  125 mg Intravenous Q M,W,F-HD  . ferric gluconate (FERRLECIT/NULECIT) IV  125 mg Intravenous Q T,Th,Sa-HD  . heparin subcutaneous  5,000 Units Subcutaneous Q8H  . Influenza vac split quadrivalent PF  0.5 mL Intramuscular Tomorrow-1000  . insulin aspart  0-9 Units Subcutaneous TID WC  . labetalol  200 mg Oral BID  . pantoprazole  40 mg Oral Daily   Continuous Infusions: . sodium chloride         Time spent: 25 min    Tim Armstrong  [facility] Pager [phone]. If 7PM-7AM, please contact night-coverage at [email or web address], Office  [phone]  password [email or web address] 03/30/2016, 10:26 AM  LOS: 5 days

## 2016-03-30 NOTE — Progress Notes (Signed)
Patient Name: Tim Armstrong Date of Encounter: 03/30/2016  Primary Cardiologist: Dr. Lance Coon Problem List     Principal Problem:   Renal failure (ARF), acute on chronic [facility]) Active Problems:   Diabetes mellitus with renal complications (HCC)   Hypertension   AKI (acute kidney injury) ([place])   Syncope   Dizziness   Surgery, elective   Weight loss, abnormal   Post-operative state     Subjective   Feeling well.  No chest pain.  No dizziness.   Inpatient Medications    . amoxicillin-clavulanate  2 tablet Oral QHS  . calcitRIOL  0.25 mcg Oral Daily  . cloNIDine  0.1 mg Oral BID  . darbepoetin (ARANESP) injection - DIALYSIS  40 mcg Intravenous Q Mon-HD  . diltiazem  240 mg Oral Daily  . feeding supplement (NEPRO CARB STEADY)  237 mL Oral BID BM  . [START ON 04/01/2016] ferric gluconate (FERRLECIT/NULECIT) IV  125 mg Intravenous Q M,W,F-HD  . ferric gluconate (FERRLECIT/NULECIT) IV  125 mg Intravenous Q T,Th,Sa-HD  . heparin subcutaneous  5,000 Units Subcutaneous Q8H  . Influenza vac split quadrivalent PF  0.5 mL Intramuscular Tomorrow-1000  . insulin aspart  0-9 Units Subcutaneous TID WC  . labetalol  200 mg Oral BID  . pantoprazole  40 mg Oral Daily    Vital Signs    Vitals:   03/30/16 0900 03/30/16 0930 03/30/16 1000 03/30/16 1028  BP: 136/76 130/74 136/72 (!) 148/68  Pulse: 79 80 79 80  Resp: 18 17 16 17   Temp:    98.5 F (36.9 C)  TempSrc:    Oral  SpO2:    100%  Weight:      Height:        Intake/Output Summary (Last 24 hours) at 03/30/16 1258 Last data filed at 03/30/16 1007  Gross per 24 hour  Intake              600 ml  Output             1850 ml  Net            -1250 ml   Filed Weights   03/28/16 1655 03/29/16 2033 03/30/16 0703  Weight: 134 lb 11.2 oz (61.1 kg) 130 lb 1.1 oz (59 kg) 133 lb 9.6 oz (60.6 kg)    Physical Exam   GEN: Well nourished, well developed, in no acute distress.  HEENT: Grossly normal.  Neck:  Supple, no JVD . Cardiac: RRR, no murmurs, rubs, or gallops. No edema.    Respiratory:  Decreased breath sounds bilaterally, no rales GI: Soft, nontender, nondistended, BS + x 4. MS: no deformity or atrophy. Skin: warm and dry, no rash. Neuro:  Strength and sensation are intact. Psych: AAOx3.  Normal affect.  Labs    CBC  Recent Labs  03/29/16 0659 03/30/16 0700  WBC 8.4 7.3  HGB 9.2* 8.0*  HCT 27.9* 24.3*  MCV 76.6* 77.4*  PLT 141* [id number]*   Basic Metabolic Panel  Recent Labs  03/28/16 1255  03/29/16 0856 03/30/16 0718  NA 141  < > 138 139  K 3.9  < > 3.7 3.4*  CL 107  < > 106 103  CO2 25  < > 24 26  GLUCOSE 96  < > 110* 141*  BUN 46*  < > 32* 37*  CREATININE 5.27*  < > 4.55* 5.21*  CALCIUM 8.3*  < > 8.5* 8.4*  PHOS 3.3  --   --  2.6  < > = values in this interval not displayed. Liver Function Tests  Recent Labs  03/28/16 0949  03/29/16 0659 03/30/16 0718  AST 17  --  26  --   ALT 10*  --  14*  --   ALKPHOS 101  --  111  --   BILITOT 0.7  --  0.6  --   PROT 5.7*  --  6.4*  --   ALBUMIN 2.7*  < > 3.1* 2.8*  < > = values in this interval not displayed. No results for input(s): LIPASE, AMYLASE in the last 72 hours. Cardiac Enzymes No results for input(s): CKTOTAL, CKMB, CKMBINDEX, TROPONINI in the last 72 hours. BNP Invalid input(s): POCBNP D-Dimer No results for input(s): DDIMER in the last 72 hours. Hemoglobin A1C No results for input(s): HGBA1C in the last 72 hours. Fasting Lipid Panel No results for input(s): CHOL, HDL, LDLCALC, TRIG, CHOLHDL, LDLDIRECT in the last 72 hours. Thyroid Function Tests No results for input(s): TSH, T4TOTAL, T3FREE, THYROIDAB in the last 72 hours.  Invalid input(s): FREET3  Telemetry    NSR  ECG      Radiology    No results found.  Echo 03/29/16 - Left ventricle: The cavity size was normal. There was mild   concentric hypertrophy. Systolic function was vigorous. The   estimated ejection fraction was in the  range of 65% to 70%. Wall   motion was normal; there were no regional wall motion   abnormalities. Doppler parameters are consistent with abnormal   left ventricular relaxation (grade 1 diastolic dysfunction). - Mitral valve: Calcified annulus. There was mild regurgitation. - Left atrium: The atrium was mildly dilated. - Pulmonary arteries: Systolic pressure was mildly increased.  Patient Profile     Tim Armstrong is a 70 y.o. male with CKD stage 5, HTN, DM, bilateral renal masses who was admitted with renal failure, heme + anemia and syncope in the setting of diarrhea.    Assessment & Plan    1. Syncope - No recurrence.  No arrhythmia noted on Tele.  Echo with normal LV function. Cardiac status stable.  No further recommendations.  Signed, Richardson Dopp, PA-C  03/30/2016, 12:58 PM    I have seen and examined the patient along with Richardson Dopp, PA-C.  I have reviewed the chart, notes and new data.  I agree with PA/NP's note.  Key new complaints: feels much better Key examination changes: no signs of hypervolemia, clear lungs, excellent R forearm fistula bruit/thrill.   PLAN: Syncope due anemia and hypovolemia. Will sign off.  Sanda Klein, MD, [place] [phone] 03/30/2016, 1:05 PM

## 2016-03-30 NOTE — Procedures (Signed)
I have seen and examined this patient and agree with the plan of care .  No complaints     Tzivia Oneil W 03/30/2016, 8:50 AM

## 2016-03-30 NOTE — Progress Notes (Signed)
[facility] Gastroenterology Progress Note  Tim Armstrong 70 y.o. 10-02-1945  Chief complaint. Follow-up for anemia. Subjective: Patient is doing better. Complaining of weakness. Denied any visible blood in the stool. Able to tolerate diet. Denied abdominal pain.  Objective: Vital signs in last 24 hours: Vitals:   03/30/16 1000 03/30/16 1028  BP: 136/72 (!) 148/68  Pulse: 79 80  Resp: 16 17  Temp:  98.5 F (36.9 C)   ROS : Denies any chest pain or shortness of breath. Denied abdominal pain. Denied nausea or vomiting. Physical Exam:  General:  Alert, cooperative, no distress, appears stated age  Head:  Normocephalic, without obvious abnormality, atraumatic  Eyes:  , EOM's intact,   Lungs:   Clear to auscultation bilaterally, respirations unlabored  Heart:  Regular rate and rhythm, S1, S2 normal  Abdomen:   Soft, non-tender, bowel sounds active all four quadrants,  no masses,   Extremities: Extremities normal, atraumatic, no  edema       Lab Results:  Recent Labs  03/28/16 1255  03/29/16 0856 03/30/16 0718  NA 141  < > 138 139  K 3.9  < > 3.7 3.4*  CL 107  < > 106 103  CO2 25  < > 24 26  GLUCOSE 96  < > 110* 141*  BUN 46*  < > 32* 37*  CREATININE 5.27*  < > 4.55* 5.21*  CALCIUM 8.3*  < > 8.5* 8.4*  PHOS 3.3  --   --  2.6  < > = values in this interval not displayed.  Recent Labs  03/28/16 0949  03/29/16 0659 03/30/16 0718  AST 17  --  26  --   ALT 10*  --  14*  --   ALKPHOS 101  --  111  --   BILITOT 0.7  --  0.6  --   PROT 5.7*  --  6.4*  --   ALBUMIN 2.7*  < > 3.1* 2.8*  < > = values in this interval not displayed.  Recent Labs  03/29/16 0659 03/30/16 0700  WBC 8.4 7.3  HGB 9.2* 8.0*  HCT 27.9* 24.3*  MCV 76.6* 77.4*  PLT 141* 123*   No results for input(s): LABPROT, INR in the last 72 hours.    Assessment/Plan:  Anemia. Status post negative EGD  Positive stool for occult blood CKD   Recommendations ------------------------ - Offered  colonoscopy for tomorrow. Patient declined. Stated that he is too weak to start prep for colonoscopy today. Wants to have it done on Monday. - Clear liquid diet starting tomorrow morning. Nothing by mouth past midnight. Plan for colonoscopy on Monday - GI will follow   Tim Armstrong 03/30/2016, 12:06 PM  Pager [phone]  If no answer or after 5 PM call [phone]

## 2016-03-31 LAB — GLUCOSE, CAPILLARY
GLUCOSE-CAPILLARY: 122 mg/dL — AB (ref 65–99)
GLUCOSE-CAPILLARY: 154 mg/dL — AB (ref 65–99)
Glucose-Capillary: 153 mg/dL — ABNORMAL HIGH (ref 65–99)
Glucose-Capillary: 107 mg/dL — ABNORMAL HIGH (ref 65–99)

## 2016-03-31 MED ORDER — DARBEPOETIN ALFA 100 MCG/0.5ML IJ SOSY
100.0000 ug | PREFILLED_SYRINGE | INTRAMUSCULAR | Status: DC
  Administered 2016-04-02: 100 ug via INTRAVENOUS
  Filled 2016-03-31: qty 0.5

## 2016-03-31 MED ORDER — CALCITRIOL 0.5 MCG PO CAPS
0.5000 ug | ORAL_CAPSULE | ORAL | Status: DC
  Administered 2016-04-02: 0.5 ug via ORAL
  Filled 2016-03-31: qty 1

## 2016-03-31 MED ORDER — PEG 3350-KCL-NA BICARB-NACL 420 G PO SOLR
4000.0000 mL | Freq: Once | ORAL | Status: AC
  Administered 2016-03-31: 4000 mL via ORAL
  Filled 2016-03-31: qty 4000

## 2016-03-31 NOTE — Progress Notes (Signed)
[facility]  PROGRESS NOTE  Tim Armstrong [id number] DOB: [birth year] DOA: 03/24/2016 PCP: Philis Fendt, MD    Brief HPI:   70 y.o.malewith chronic kidney disease stage IV, hypertension, diabetes mellitus was brought to the ER from [place] he became lightheaded and near-syncopal. Blood sugar was in the 200s. Patient states over the last 2 weeks he hasbeen feeling fatigue and last 2 days he has been having multiple episodes of diarrhea.     In the ER patient's creatinine is found to be 7. Last creatinine in our system was around 4.7 in 2014 and at that time, he was recommended to have a fistula placed. He was lost to follow up. He was found also to have a Hb of 8 which has dropped to 6.9 with IVF given in ER.    Assessment/Plan:    1. CKD stage V- Now ESRD. Vascular surgery   consulted for tunneled catheter and AV Fistula which was placed on 9/13  . Patient  likely uremic with weight loss.started hemodialysis 9/13. Started CLIP process to [facility]. Receiving hemodialysis starting 9/13 via right IJ catheter  By Elam Dutch, MD.he is likely to start on at TTS schedule as an outpt. Marland Kitchen Next HD Tuesday   2. Anemia with blood positive stool- gastroenterology has been consulted  , CT  abdomen and pelvis with oral contrast, shows worsening bilateral renal cysts, no hydronephrosis, cannot rule out pyelonephritis. Possible pneumonia in the right lower lobe. Patient has had 40 pound weight loss history. Hemoglobin is 9.2 and improving after blood transfusion. EGD on 9/14 showed erosive gastropathy, nonbleeding.  Continue PPI.Received 1 unit PRBCs 03/25/16. Received 150 mcg Aranesp 03/25/16.Plan for inpt colonoscopy on Monday   by Dr. Alessandra Bevels who is on call this weekend.    3. Diabetes mellitus-held Glucotrol,  glucose is well controlled, continue sliding scale insulin with NovoLog.   4. Hypertension-blood pressure is controlled, continue clonidine,  labetalol, Cardizem,   5. Diarrhea- stool for C. difficile is negative, continue Imodium when necessary for diarrhea   6. Syncope- likely from diarrhea, but patient has had 3 episodes of syncope in the recent past.   2-D echo shows EF of 65-70%, no regional wall motion, grade 1 diastolic dysfunction,   normal sinus rhythm on telemetry   7. Elevated troponin- patient had mild elevation of troponin 0.06, which came down to 0.03, no chest pain. EKG showed nonspecific T-wave abnormality and T-wave inversions in lead V2 to V5 . 2-D echo pending. Cardiology consulted  for pre-syncope. Patient may need outpatient event monitor. Cardiology is no further recommendations. Diffuse syncope is due to anemia and hypovolemia, cardiology has signed off   8. probable HCAP-started on  cefepime and   Vancomycin, now switched to augmentin   DVT prophylaxis: Heparin Code Status: Full code Family Communication: No family at bedside Disposition Plan:  Colonoscopy tomorrow   Consultants:  Vascular surgery  Nephrology  GI  Cardiology  Procedures:  EGD 9/14  Ultrasound of neck, Right Internal jugular vein Palindrome catheter, Right Brachial Cephalic AV fistula [id number]  Antibiotics: Augmentin 9/14-9/18 Vancomycin/cefepime 9/13-9/14        Subjective- Feeling well.  No chest pain.  No dizziness.   Objective    Objective: Vitals:   03/30/16 2230 03/30/16 2241 03/31/16 0512 03/31/16 0902  BP: 135/64 135/64 124/60 133/64  Pulse: 74 74 76 74  Resp:  16 16 18   Temp:  98.3 F (36.8 C) 98.5 F (36.9 C) 98.3  F (36.8 C)  TempSrc:  Oral Oral Oral  SpO2:  100% 100% 100%  Weight:      Height:        Intake/Output Summary (Last 24 hours) at 03/31/16 1107 Last data filed at 03/31/16 0500  Gross per 24 hour  Intake              480 ml  Output              450 ml  Net               30 ml   Filed Weights   03/29/16 2033 03/30/16 0703 03/30/16 1005  Weight: 59 kg (130 lb 1.1 oz) 60.6 kg  (133 lb 9.6 oz) 57.1 kg (125 lb 14.1 oz)    Examination:  General exam: Appears calm and comfortable  Respiratory system: Clear to auscultation. Respiratory effort normal. Cardiovascular system: S1 & S2 heard, RRR. No JVD, murmurs, rubs, gallops or clicks. No pedal edema. Gastrointestinal system: Abdomen is nondistended, soft and nontender. No organomegaly or masses felt. Normal bowel sounds heard. Central nervous system: Alert and oriented. No focal neurological deficits. Extremities: Symmetric 5 x 5 power. Skin: No rashes, lesions or ulcers Psychiatry: Judgement and insight appear normal. Mood & affect appropriate.    Data Reviewed: I have personally reviewed following labs and imaging studies Basic Metabolic Panel:  Recent Labs Lab 03/27/16 0517 03/27/16 1858 03/28/16 0949 03/28/16 1255 03/29/16 0659 03/29/16 0856 03/30/16 0718  NA 136 138 138 141 140 138 139  K 3.5 3.8 3.9 3.9 3.9 3.7 3.4*  CL 106 106 107 107 107 106 103  CO2 22 23 25 25 24 24 26   GLUCOSE 119* 133* 84 96 94 110* 141*  BUN 77* 71* 45* 46* 33* 32* 37*  CREATININE 6.89* 6.59* 5.11* 5.27* 4.59* 4.55* 5.21*  CALCIUM 8.0* 8.2* 8.2* 8.3* 8.7* 8.5* 8.4*  PHOS 4.0 3.8  --  3.3  --   --  2.6   Liver Function Tests:  Recent Labs Lab 03/24/16 1850 03/25/16 0439 03/27/16 1858 03/28/16 0949 03/28/16 1255 03/29/16 0659 03/30/16 0718  AST 21 16  --  17  --  26  --   ALT 12* 10*  --  10*  --  14*  --   ALKPHOS 132* 103  --  101  --  111  --   BILITOT 0.3 0.5  --  0.7  --  0.6  --   PROT 7.1 5.9*  --  5.7*  --  6.4*  --   ALBUMIN 3.8 3.1* 2.9* 2.7* 2.9* 3.1* 2.8*   No results for input(s): LIPASE, AMYLASE in the last 168 hours. No results for input(s): AMMONIA in the last 168 hours. CBC:  Recent Labs Lab 03/24/16 1850 03/25/16 0439  03/27/16 2014 03/28/16 0949 03/28/16 1255 03/29/16 0659 03/30/16 0700  WBC 7.4 4.7  < > 7.1 7.9 7.8 8.4 7.3  NEUTROABS 5.8 2.7  --   --   --   --   --   --   HGB  8.4* 6.9*  < > 8.6* 8.6* 8.9* 9.2* 8.0*  HCT 24.7* 20.5*  < > 25.9* 26.2* 27.4* 27.9* 24.3*  MCV 73.5* 73.5*  < > 75.5* 75.9* 76.5* 76.6* 77.4*  PLT 214 190  < > 143* 142* 152 141* 123*  < > = values in this interval not displayed. Cardiac Enzymes:  Recent Labs Lab 03/25/16 0727 03/25/16 1331 03/25/16 1857  TROPONINI 0.03*  0.06* 0.03*   BNP (last 3 results) No results for input(s): BNP in the last 8760 hours.  ProBNP (last 3 results) No results for input(s): PROBNP in the last 8760 hours.  CBG:  Recent Labs Lab 03/30/16 1025 03/30/16 1133 03/30/16 1615 03/30/16 2238 03/31/16 0820  GLUCAP 110* 203* 83 155* 153*    Recent Results (from the past 240 hour(s))  C difficile quick scan w PCR reflex     Status: None   Collection Time: 03/24/16 10:02 PM  Result Value Ref Range Status   C Diff antigen NEGATIVE NEGATIVE Final   C Diff toxin NEGATIVE NEGATIVE Final   C Diff interpretation No C. difficile detected.  Final  Gastrointestinal Panel by PCR , Stool     Status: None   Collection Time: 03/24/16 10:02 PM  Result Value Ref Range Status   Campylobacter species NOT DETECTED NOT DETECTED Final   Plesimonas shigelloides NOT DETECTED NOT DETECTED Final   Salmonella species NOT DETECTED NOT DETECTED Final   Yersinia enterocolitica NOT DETECTED NOT DETECTED Final   Vibrio species NOT DETECTED NOT DETECTED Final   Vibrio cholerae NOT DETECTED NOT DETECTED Final   Enteroaggregative E coli (EAEC) NOT DETECTED NOT DETECTED Final   Enteropathogenic E coli (EPEC) NOT DETECTED NOT DETECTED Final   Enterotoxigenic E coli (ETEC) NOT DETECTED NOT DETECTED Final   Shiga like toxin producing E coli (STEC) NOT DETECTED NOT DETECTED Final   E. coli O157 NOT DETECTED NOT DETECTED Final   Shigella/Enteroinvasive E coli (EIEC) NOT DETECTED NOT DETECTED Final   Cryptosporidium NOT DETECTED NOT DETECTED Final   Cyclospora cayetanensis NOT DETECTED NOT DETECTED Final   Entamoeba histolytica  NOT DETECTED NOT DETECTED Final   Giardia lamblia NOT DETECTED NOT DETECTED Final   Adenovirus F40/41 NOT DETECTED NOT DETECTED Final   Astrovirus NOT DETECTED NOT DETECTED Final   Norovirus GI/GII NOT DETECTED NOT DETECTED Final   Rotavirus A NOT DETECTED NOT DETECTED Final   Sapovirus (I, II, IV, and V) NOT DETECTED NOT DETECTED Final     Studies: No results found.  Scheduled Meds: . amoxicillin-clavulanate  2 tablet Oral QHS  . [START ON 04/02/2016] calcitRIOL  0.5 mcg Oral Q T,Th,Sa-HD  . cloNIDine  0.1 mg Oral BID  . [START ON 04/02/2016] darbepoetin (ARANESP) injection - DIALYSIS  100 mcg Intravenous Q Tue-HD  . diltiazem  240 mg Oral Daily  . feeding supplement (NEPRO CARB STEADY)  237 mL Oral BID BM  . [START ON 04/01/2016] ferric gluconate (FERRLECIT/NULECIT) IV  125 mg Intravenous Q M,W,F-HD  . heparin subcutaneous  5,000 Units Subcutaneous Q8H  . Influenza vac split quadrivalent PF  0.5 mL Intramuscular Tomorrow-1000  . insulin aspart  0-9 Units Subcutaneous TID WC  . labetalol  200 mg Oral BID  . pantoprazole  40 mg Oral Daily   Continuous Infusions: . sodium chloride         Time spent: 25 min    [facility] Pager [phone]. If 7PM-7AM, please contact night-coverage at [email or web address], Office  [phone]  password [email or web address] 03/31/2016, 11:07 AM  LOS: 6 days

## 2016-03-31 NOTE — Progress Notes (Signed)
[facility] Progress Note   Subjective:  "Dr. Oneida Alar operated on my wife last night! I feel OK, they just aren't feeding me much but I have to have that colonoscopy tomorrow. " Concerned about wife, but no other complaints.   Objective Vitals:   03/30/16 2230 03/30/16 2241 03/31/16 0512 03/31/16 0902  BP: 135/64 135/64 124/60 133/64  Pulse: 74 74 76 74  Resp:  16 16 18   Temp:  98.3 F (36.8 C) 98.5 F (36.9 C) 98.3 F (36.8 C)  TempSrc:  Oral Oral Oral  SpO2:  100% 100% 100%  Weight:      Height:       Physical Exam General: Pleasant, NAD Heart: S1,S2, RRR Lungs: BBS CTA A/P Abdomen: Soft, non-tender, active BS Extremities: No LE edema, pedal pulses intact.  Dialysis Access: new right sided PC and right AVF Strong bruit   Additional Objective Labs: Basic Metabolic Panel:  Recent Labs Lab 03/27/16 1858  03/28/16 1255 03/29/16 0659 03/29/16 0856 03/30/16 0718  NA 138  < > 141 140 138 139  K 3.8  < > 3.9 3.9 3.7 3.4*  CL 106  < > 107 107 106 103  CO2 23  < > 25 24 24 26   GLUCOSE 133*  < > 96 94 110* 141*  BUN 71*  < > 46* 33* 32* 37*  CREATININE 6.59*  < > 5.27* 4.59* 4.55* 5.21*  CALCIUM 8.2*  < > 8.3* 8.7* 8.5* 8.4*  PHOS 3.8  --  3.3  --   --  2.6  < > = values in this interval not displayed. Liver Function Tests:  Recent Labs Lab 03/25/16 0439  03/28/16 0949 03/28/16 1255 03/29/16 0659 03/30/16 0718  AST 16  --  17  --  26  --   ALT 10*  --  10*  --  14*  --   ALKPHOS 103  --  101  --  111  --   BILITOT 0.5  --  0.7  --  0.6  --   PROT 5.9*  --  5.7*  --  6.4*  --   ALBUMIN 3.1*  < > 2.7* 2.9* 3.1* 2.8*  < > = values in this interval not displayed. No results for input(s): LIPASE, AMYLASE in the last 168 hours. CBC:  Recent Labs Lab 03/24/16 1850 03/25/16 0439  03/27/16 2014 03/28/16 0949 03/28/16 1255 03/29/16 0659 03/30/16 0700  WBC 7.4 4.7  < > 7.1 7.9 7.8 8.4 7.3  NEUTROABS 5.8 2.7  --   --   --   --   --   --   HGB  8.4* 6.9*  < > 8.6* 8.6* 8.9* 9.2* 8.0*  HCT 24.7* 20.5*  < > 25.9* 26.2* 27.4* 27.9* 24.3*  MCV 73.5* 73.5*  < > 75.5* 75.9* 76.5* 76.6* 77.4*  PLT 214 190  < > 143* 142* 152 141* 123*  < > = values in this interval not displayed. Blood Culture    Component Value Date/Time   SDES BLOOD HAND RIGHT 08/02/2012 0840   SPECREQUEST BOTTLES DRAWN AEROBIC AND ANAEROBIC 10CC EACH 08/02/2012 0840   CULT NO GROWTH 5 DAYS 08/02/2012 0840   REPTSTATUS 08/08/2012 FINAL 08/02/2012 0840    Cardiac Enzymes:  Recent Labs Lab 03/25/16 0727 03/25/16 1331 03/25/16 1857  TROPONINI 0.03* 0.06* 0.03*   CBG:  Recent Labs Lab 03/30/16 1025 03/30/16 1133 03/30/16 1615 03/30/16 2238 03/31/16 0820  GLUCAP 110* 203* 83 155* 153*  Iron Studies: No results for input(s): IRON, TIBC, TRANSFERRIN, FERRITIN in the last 72 hours. @lablastinr3 @ Studies/Results: No results found. Medications: . sodium chloride     . amoxicillin-clavulanate  2 tablet Oral QHS  . calcitRIOL  0.25 mcg Oral Daily  . cloNIDine  0.1 mg Oral BID  . darbepoetin (ARANESP) injection - DIALYSIS  40 mcg Intravenous Q Mon-HD  . diltiazem  240 mg Oral Daily  . feeding supplement (NEPRO CARB STEADY)  237 mL Oral BID BM  . [START ON 04/01/2016] ferric gluconate (FERRLECIT/NULECIT) IV  125 mg Intravenous Q M,W,F-HD  . heparin subcutaneous  5,000 Units Subcutaneous Q8H  . Influenza vac split quadrivalent PF  0.5 mL Intramuscular Tomorrow-1000  . insulin aspart  0-9 Units Subcutaneous TID WC  . labetalol  200 mg Oral BID  . pantoprazole  40 mg Oral Daily     Assessment/Plan: 1. CKD Stage V-now ESRD.Lost to F/U last visit with Dr. Florene Glen 3 years ago. 40lb wt loss. RUA AVF and RIJ TDC placed 03/27/16 per Dr. Oneida Alar 1st HD 03/27/16. CLIP process to [place] been talked to about transplant and home dialysis options per Dr. Moshe Cipro.  Had HD Wed,Thursday, Saturday. Next HD Tuesday. K+ 3.4-use 4 K  bath. No edema. Post wt 57.1 03/30/16.  2. HTN/volume- Multiple antihypertensive meds/clonidine/cardizem/labetolol. BP controlled at present no edema. HD 03/30/16 pre wt 60.6 Net UF 1500 Post wt 57.1. Tolerated well without issues.  3. Anemiaof CKD/also guiac + stools.Transfused 1 unit for hgb 6.9 on 9/11.lsat  12% on course of  IV Fe and ESA. GI following. No dramatic findings on endo - for colonoscopy 04/01/16. Cont PPI.  Rec'd 150 mcg Aranesp 03/25/16. Hgb down to 8.0. Increase ESA do 100 mcg IV q Tuesday with HD.  4. Secondary hyperparathyroidism- Ca 8.4 C Ca 9.5  iPTH 375  On  Calcitriol 0.25 mcg daily changed to 0.5 mcg PO q TTS with HD. Cont on 2.5 Ca bath.  5. Nutrition-clear liquid diet at present 6. DM: per primary. 7. prob HCAP - on Augmentin  Kamilya Wakeman H. Conception Doebler NP-C 03/31/2016, 9:18 AM  [employer] [phone]

## 2016-03-31 NOTE — Progress Notes (Signed)
[facility]astroenterology Progress Note  ALMOND FRIES 70 y.o. 05/02/1946  Chief complaint. Follow-up for anemia.  Subjective: Patient is doing better. Denied abdominal pain. He denied visible blood in the stool.  Objective: Vital signs in last 24 hours: Vitals:   03/31/16 0512 03/31/16 0902  BP: 124/60 133/64  Pulse: 76 74  Resp: 16 18  Temp: 98.5 F (36.9 C) 98.3 F (36.8 C)   ROS : Denies any chest pain or shortness of breath. Denied abdominal pain. Denied nausea or vomiting. Physical Exam:  General:  Alert, cooperative, no distress, appears stated age  Head:  Normocephalic, without obvious abnormality, atraumatic  Eyes:  , EOM's intact,   Lungs:   Clear to auscultation bilaterally, respirations unlabored  Heart:  Regular rate and rhythm, S1, S2 normal  Abdomen:   Soft, non-tender, bowel sounds active all four quadrants,  no masses,   Extremities: Extremities normal, atraumatic, no  edema       Lab Results:  Recent Labs  03/28/16 1255  03/29/16 0856 03/30/16 0718  NA 141  < > 138 139  K 3.9  < > 3.7 3.4*  CL 107  < > 106 103  CO2 25  < > 24 26  GLUCOSE 96  < > 110* 141*  BUN 46*  < > 32* 37*  CREATININE 5.27*  < > 4.55* 5.21*  CALCIUM 8.3*  < > 8.5* 8.4*  PHOS 3.3  --   --  2.6  < > = values in this interval not displayed.  Recent Labs  03/29/16 0659 03/30/16 0718  AST 26  --   ALT 14*  --   ALKPHOS 111  --   BILITOT 0.6  --   PROT 6.4*  --   ALBUMIN 3.1* 2.8*    Recent Labs  03/29/16 0659 03/30/16 0700  WBC 8.4 7.3  HGB 9.2* 8.0*  HCT 27.9* 24.3*  MCV 76.6* 77.4*  PLT 141* 123*   No results for input(s): LABPROT, INR in the last 72 hours.    Assessment/Plan:  Anemia. Status post negative EGD  Positive stool for occult blood CKD   Recommendations ------------------------ - Colonoscopy tomorrow. Risks benefits and alternatives discussed with the patient. Verbalized understanding. - Nothing by mouth past midnight - CBC in a.m. - GI  will follow   Otis Brace, MD, [place] 03/31/2016, 11:19 AM  Pager [phone]  If no answer or after 5 PM call [phone]

## 2016-04-01 ENCOUNTER — Encounter (HOSPITAL_COMMUNITY): Payer: Self-pay | Admitting: *Deleted

## 2016-04-01 ENCOUNTER — Encounter (HOSPITAL_COMMUNITY)
Admission: EM | Disposition: A | Discharge status: Home Health | Payer: Self-pay | Source: Home / Self Care | Attending: Internal Medicine

## 2016-04-01 ENCOUNTER — Inpatient Hospital Stay (HOSPITAL_COMMUNITY): Payer: Medicare HMO | Admitting: Anesthesiology

## 2016-04-01 HISTORY — PX: COLONOSCOPY: SHX5424

## 2016-04-01 LAB — GLUCOSE, CAPILLARY
GLUCOSE-CAPILLARY: 131 mg/dL — AB (ref 65–99)
Glucose-Capillary: 136 mg/dL — ABNORMAL HIGH (ref 65–99)
Glucose-Capillary: 117 mg/dL — ABNORMAL HIGH (ref 65–99)
Glucose-Capillary: 183 mg/dL — ABNORMAL HIGH (ref 65–99)

## 2016-04-01 LAB — CBC
HCT: 25.8 % — ABNORMAL LOW (ref 39.0–52.0)
Hemoglobin: 8.1 g/dL — ABNORMAL LOW (ref 13.0–17.0)
MCH: 24.5 pg — AB (ref 26.0–34.0)
MCHC: 31.4 g/dL (ref 30.0–36.0)
MCV: 78.2 fL (ref 78.0–100.0)
PLATELETS: 149 10*3/uL — AB (ref 150–400)
RBC: 3.3 MIL/uL — ABNORMAL LOW (ref 4.22–5.81)
RDW: 17.6 % — AB (ref 11.5–15.5)
WBC: 5.9 10*3/uL (ref 4.0–10.5)

## 2016-04-01 SURGERY — COLONOSCOPY
Anesthesia: Monitor Anesthesia Care

## 2016-04-01 MED ORDER — GLYCOPYRROLATE 0.2 MG/ML IJ SOLN
INTRAMUSCULAR | Status: DC | PRN
Start: 2016-04-01 — End: 2016-04-01
  Administered 2016-04-01 (×2): 0.1 mg via INTRAVENOUS

## 2016-04-01 MED ORDER — LACTATED RINGERS IV SOLN
INTRAVENOUS | Status: DC | PRN
  Administered 2016-04-01: 13:00:00 via INTRAVENOUS

## 2016-04-01 MED ORDER — PROPOFOL 500 MG/50ML IV EMUL
INTRAVENOUS | Status: DC | PRN
  Administered 2016-04-01: 100 ug/kg/min via INTRAVENOUS

## 2016-04-01 MED ORDER — SPOT INK MARKER SYRINGE KIT
PACK | SUBMUCOSAL | Status: AC
  Filled 2016-04-01: qty 5

## 2016-04-01 MED ORDER — SODIUM CHLORIDE 0.9 % IV SOLN
INTRAVENOUS | Status: DC | PRN
  Administered 2016-04-01: 14:00:00 via INTRAVENOUS

## 2016-04-01 MED ORDER — SPOT INK MARKER SYRINGE KIT
PACK | SUBMUCOSAL | Status: DC | PRN
  Administered 2016-04-01: 2 mL via SUBMUCOSAL

## 2016-04-01 MED ORDER — LIDOCAINE 2% (20 MG/ML) 5 ML SYRINGE
INTRAMUSCULAR | Status: DC | PRN
  Administered 2016-04-01: 60 mg via INTRAVENOUS

## 2016-04-01 NOTE — Progress Notes (Addendum)
[facility] Progress Note   Dialysis Orders: new ESRD  Assessment/Plan: 1. CKD Stage V-now ESRD.Lost to F/U last visit with Dr. Florene Glen 3 years ago. 40lb wt loss. RUA AVF and RIJ TDC placed 03/27/16 per Dr. Oneida Alar 1st HD 03/27/16. CLIP process to [facility] been talked to about transplant and home dialysis options per Dr. Moshe Cipro. Had HD Wed,Thursday, Saturday. Next HD Tuesday. K+ 3.4-use 4 K bath. No edema. Post wt 57.1 03/30/16.  2. HTN/volume- Multiple antihypertensive meds/clonidine/cardizem/labetolol. BP controlled at present no edema. HD 03/30/16 pre wt 60.6 Net UF 1500 Post wt 57.1. Tolerated well without issues.  3. Anemiaof CKD/also guiac + stools.Transfused 1 unit for hgb 6.9 on 9/11.lsat 12% on course of IV Fe and ESA. No dramatic findings on endo.  Cont PPI. Rec'd 150 mcg Aranesp 03/25/16. Hgb down to 8.1. Increase ESA do 100 mcg IV q Tuesday with HD. Colonoscopy 9/18 > Polypoid/mass lesion at the hepatic flexure. Biopsied. Injected. One 15 mm polyp in the descending colon, removed with a hot snare. Resected and retrieved. Diverticulosis in the sigmoid colon. Internal hemorrhoids. 4. Secondary hyperparathyroidism- Ca 8.4 C Ca 9.5  iPTH 375 On  Calcitriol 0.25 mcg daily changed to 0.5 mcg PO q TTS with HD. Use 2.25 Ca bath 5. Nutrition-npo for colonoscopy- resume renal/carb mod post procedure  6. DM: per primary.  Myriam Jacobson, PA-C [place] 04/01/2016,9:28 AM  LOS: 7 days   Pt seen, examined and agree w A/P as above.  Kelly Splinter MD [facility] pager [phone]    cell [phone] 04/01/2016, 12:42 PM    Subjective:   For colonoscopy today - hungry  Objective Vitals:   03/31/16 0902 03/31/16 1747 03/31/16 2200 04/01/16 0300  BP: 133/64 (!) 126/55 (!) 146/72 (!) 142/65  Pulse: 74 74 75 70  Resp: 18 18    Temp: 98.3 F (36.8 C) 98.7 F (37.1 C) 98 F  (36.7 C) 98.5 F (36.9 C)  TempSrc: Oral Oral Oral Oral  SpO2: 100% 100% 100% 100%  Weight:   58.5 kg (128 lb 15.5 oz)   Height:       Physical Exam General: NAD thin male Heart: RRR Lungs: no rales Abdomen: soft NT Extremities: no edema Dialysis Access: new right IJ and right AVF maturing nicely + bruit   Additional Objective Labs: Basic Metabolic Panel:  Recent Labs Lab 03/27/16 1858  03/28/16 1255 03/29/16 0659 03/29/16 0856 03/30/16 0718  NA 138  < > 141 140 138 139  K 3.8  < > 3.9 3.9 3.7 3.4*  CL 106  < > 107 107 106 103  CO2 23  < > 25 24 24 26   GLUCOSE 133*  < > 96 94 110* 141*  BUN 71*  < > 46* 33* 32* 37*  CREATININE 6.59*  < > 5.27* 4.59* 4.55* 5.21*  CALCIUM 8.2*  < > 8.3* 8.7* 8.5* 8.4*  PHOS 3.8  --  3.3  --   --  2.6  < > = values in this interval not displayed. Liver Function Tests:  Recent Labs Lab 03/28/16 0949 03/28/16 1255 03/29/16 0659 03/30/16 0718  AST 17  --  26  --   ALT 10*  --  14*  --   ALKPHOS 101  --  111  --   BILITOT 0.7  --  0.6  --   PROT 5.7*  --  6.4*  --   ALBUMIN  2.7* 2.9* 3.1* 2.8*   CBC:  Recent Labs Lab 03/28/16 0949 03/28/16 1255 03/29/16 0659 03/30/16 0700 04/01/16 0431  WBC 7.9 7.8 8.4 7.3 5.9  HGB 8.6* 8.9* 9.2* 8.0* 8.1*  HCT 26.2* 27.4* 27.9* 24.3* 25.8*  MCV 75.9* 76.5* 76.6* 77.4* 78.2  PLT 142* 152 141* 123* 149*   Blood Culture    Component Value Date/Time   SDES BLOOD HAND RIGHT 08/02/2012 0840   SPECREQUEST BOTTLES DRAWN AEROBIC AND ANAEROBIC 10CC EACH 08/02/2012 0840   CULT NO GROWTH 5 DAYS 08/02/2012 0840   REPTSTATUS 08/08/2012 FINAL 08/02/2012 0840    Cardiac Enzymes:  Recent Labs Lab 03/25/16 1331 03/25/16 1857  TROPONINI 0.06* 0.03*   CBG:  Recent Labs Lab 03/31/16 0820 03/31/16 1234 03/31/16 1650 03/31/16 2211 04/01/16 0801  GLUCAP 153* 107* 122* 154* 136*  Medications: . sodium chloride     . [START ON 04/02/2016] calcitRIOL  0.5 mcg Oral Q T,Th,Sa-HD  .  cloNIDine  0.1 mg Oral BID  . [START ON 04/02/2016] darbepoetin (ARANESP) injection - DIALYSIS  100 mcg Intravenous Q Tue-HD  . diltiazem  240 mg Oral Daily  . feeding supplement (NEPRO CARB STEADY)  237 mL Oral BID BM  . ferric gluconate (FERRLECIT/NULECIT) IV  125 mg Intravenous Q M,W,F-HD  . heparin subcutaneous  5,000 Units Subcutaneous Q8H  . Influenza vac split quadrivalent PF  0.5 mL Intramuscular Tomorrow-1000  . insulin aspart  0-9 Units Subcutaneous TID WC  . labetalol  200 mg Oral BID  . pantoprazole  40 mg Oral Daily

## 2016-04-01 NOTE — Progress Notes (Signed)
Paged Gastroenterologist to give a cal regarding patient just started moving his bowel this am. Spoke with Endo RN continue taking oral prep.

## 2016-04-01 NOTE — Anesthesia Procedure Notes (Signed)
Procedure Name: MAC Date/Time: 04/01/2016 1:24 PM Performed by: Rush Farmer E Pre-anesthesia Checklist: Patient identified, Emergency Drugs available, Suction available and Patient being monitored Patient Re-evaluated:Patient Re-evaluated prior to inductionOxygen Delivery Method: Simple face mask Intubation Type: IV induction Placement Confirmation: positive ETCO2

## 2016-04-01 NOTE — Anesthesia Postprocedure Evaluation (Signed)
Anesthesia Post Note  Patient: AUDRICK GENCO  Procedure(s) Performed: Procedure(s) (LRB): COLONOSCOPY (N/A)  Patient location during evaluation: PACU Anesthesia Type: MAC Level of consciousness: awake and alert Pain management: pain level controlled Vital Signs Assessment: post-procedure vital signs reviewed and stable Respiratory status: spontaneous breathing, nonlabored ventilation, respiratory function stable and patient connected to nasal cannula oxygen Cardiovascular status: stable and blood pressure returned to baseline Anesthetic complications: no    Last Vitals:  Vitals:   04/01/16 1428 04/01/16 1438  BP: 126/64 (!) 143/69  Pulse: 80 80  Resp: 15 16  Temp:      Last Pain:  Vitals:   04/01/16 1243  TempSrc: Oral  PainSc:                  Effie Berkshire

## 2016-04-01 NOTE — Care Management Note (Signed)
Case Management Note  Patient Details  Name: Tim Armstrong MRN: [id number] Date of Birth: 1945/07/26  Subjective/Objective:   CM following for progression and d/c planning.                  Action/Plan: 04/01/2016 Met with pt re HH needs, pt will need 3:1 commode and is requesting a shower seat, orders placed. Pt given options and selected [facility] for [facility] services. Pt was given application for SCAT and informed this CM that his family/friend will drive him to HD until SCAT is arranged. CSW, Crawford Givens will complete her part of the SCAT application and send for the pt. This CM provided pt with phone number for [employer] Wheels as this requires the pt to call and provide info in order for the pt to receive meals.    Expected Discharge Date:       04/02/2016           Expected Discharge Plan:  [place]  In-House Referral:  NA  Discharge planning Services  CM Consult  Post Acute Care Choice:  Durable Medical Equipment, Home Health Choice offered to:  Patient  DME Arranged:  3-N-1, Shower stool DME Agency:  [place]:  PT, OT [facility] Agency:  [place]  Status of Service:  Completed, signed off  If discussed at [place] of Stay Meetings, dates discussed:    Additional Comments:  Adron Bene, RN 04/01/2016, 3:01 PM

## 2016-04-01 NOTE — Anesthesia Preprocedure Evaluation (Addendum)
Anesthesia Evaluation  Patient identified by MRN, date of birth, ID band Patient awake    Reviewed: Allergy & Precautions, NPO status   History of Anesthesia Complications Negative for: history of anesthetic complications  Airway Mallampati: II  TM Distance: >3 FB Neck ROM: Full    Dental  (+) Missing, Dental Advisory Given   Pulmonary neg shortness of breath, neg sleep apnea, neg COPD, neg recent URI, former smoker,    breath sounds clear to auscultation       Cardiovascular hypertension, Pt. on medications and Pt. on home beta blockers  Rhythm:Regular     Neuro/Psych negative neurological ROS  negative psych ROS   GI/Hepatic negative GI ROS, + guaiac    Endo/Other  diabetes  Renal/GU ESRF and DialysisRenal disease     Musculoskeletal   Abdominal   Peds  Hematology  (+) anemia ,   Anesthesia Other Findings   Reproductive/Obstetrics                             Anesthesia Physical Anesthesia Plan  ASA: III  Anesthesia Plan: MAC   Post-op Pain Management:    Induction: Intravenous  Airway Management Planned: Nasal Cannula, Natural Airway and Simple Face Mask  Additional Equipment: None  Intra-op Plan:   Post-operative Plan:   Informed Consent: I have reviewed the patients History and Physical, chart, labs and discussed the procedure including the risks, benefits and alternatives for the proposed anesthesia with the patient or authorized representative who has indicated his/her understanding and acceptance.   Dental advisory given  Plan Discussed with: CRNA and Surgeon  Anesthesia Plan Comments:         Anesthesia Quick Evaluation

## 2016-04-01 NOTE — Op Note (Signed)
[facility] Patient Name: Tim Armstrong Procedure Date : 04/01/2016 MRN: [id number] Attending MD: Wonda Horner , MD Date of Birth: Apr 11, 1946 CSN: [id number] Age: 70 Admit Type: Inpatient Procedure:                Colonoscopy Indications:              Heme positive stool Providers:                Wonda Horner, MD, Cleda Daub, RN, Otilio Saber, Technician, Rhae Lerner, CRNA Referring MD:              Medicines:                Propofol per Anesthesia Complications:            No immediate complications. Estimated Blood Loss:     Estimated blood loss: none. Procedure:                Pre-Anesthesia Assessment:                           - Prior to the procedure, a History and Physical                            was performed, and patient medications and                            allergies were reviewed. The patient's tolerance of                            previous anesthesia was also reviewed. The risks                            and benefits of the procedure and the sedation                            options and risks were discussed with the patient.                            All questions were answered, and informed consent                            was obtained. Prior Anticoagulants: The patient has                            taken no previous anticoagulant or antiplatelet                            agents. ASA Grade Assessment: III - A patient with                            severe systemic disease. After reviewing the risks  and benefits, the patient was deemed in                            satisfactory condition to undergo the procedure.                           After obtaining informed consent, the colonoscope                            was passed under direct vision. Throughout the                            procedure, the patient's blood pressure, pulse, and                            oxygen  saturations were monitored continuously. The                            EC-[id number]) scope was introduced through                            the anus and advanced to the the cecum, identified                            by appendiceal orifice and ileocecal valve. The                            EC-[id number] scope was introduced through                            the anus and advanced to the the cecum, identified                            by appendiceal orifice and ileocecal valve. The                            ileocecal valve, appendiceal orifice, and rectum                            were photographed. The colonoscopy was somewhat                            difficult. The patient tolerated the procedure                            well. The quality of the bowel preparation was                            adequate. Scope In: 1:29:26 PM Scope Out: 2:17:12 PM Scope Withdrawal Time: 0 hours 35 minutes 57 seconds  Total Procedure Duration: 0 hours 47 minutes 46 seconds  Findings:      A 30 mm polypoid lesion was found at the hepatic flexure. It was cresent       shaped. More mass like  than polyp like. The lesion was ulcerated. It was       firm in some areas. Mucosa pealed off when biossied. Biopsies were taken       with a cold forceps for histology. Area was successfully injected with 2       mL [place] ink for tattooing, just distal to the lesion.      A 15 mm polyp was found in the descending colon. The polyp was sessile.       The polyp was removed with a hot snare. Resection and retrieval were       complete.      A few diverticula were found in the sigmoid colon.      Internal hemorrhoids were found during retroflexion. The hemorrhoids       were medium-sized. Impression:               - Polypoid/mass lesion at the hepatic flexure.                            Biopsied. Injected.                           - One 15 mm polyp in the descending colon, removed                             with a hot snare. Resected and retrieved.                           - Diverticulosis in the sigmoid colon.                           - Internal hemorrhoids. Moderate Sedation:      . Recommendation:           - Resume regular diet.                           - Continue present medications.                           - No aspirin, ibuprofen, naproxen, or other                            non-steroidal anti-inflammatory drugs for 10 days                            after polyp removal.                           - Await pathology results.                           - Repeat colonoscopy is recommended. The                            colonoscopy date will be determined after pathology                            results from today's exam become  available for                            review. Procedure Code(s):        --- Professional ---                           [phone], Colonoscopy, flexible; with removal of                            tumor(s), polyp(s), or other lesion(s) by snare                            technique                           45381, Colonoscopy, flexible; with directed                            submucosal injection(s), any substance                           [id number], 59, Colonoscopy, flexible; with biopsy,                            single or multiple Diagnosis Code(s):        --- Professional ---                           D49.0, Neoplasm of unspecified behavior of                            digestive system                           D12.4, Benign neoplasm of descending colon                           K64.8, Other hemorrhoids                           R19.5, Other fecal abnormalities                           K57.30, Diverticulosis of large intestine without                            perforation or abscess without bleeding CPT copyright 2016 American Medical Association. All rights reserved. The codes documented in this report are preliminary and upon coder review may   be revised to meet current compliance requirements. Wonda Horner, MD 04/01/2016 2:30:59 PM This report has been signed electronically. Number of Addenda: 0

## 2016-04-01 NOTE — Progress Notes (Signed)
[facility]  PROGRESS NOTE  Tim Armstrong [id number] DOB: 1946/02/16 DOA: 03/24/2016 PCP: Philis Fendt, MD    Brief HPI:   70 y.o.malewith chronic kidney disease stage IV, hypertension, diabetes mellitus was brought to the ER from [place] he became lightheaded and near-syncopal. Blood sugar was in the 200s. Patient states over the last 2 weeks he hasbeen feeling fatigue and last 2 days he has been having multiple episodes of diarrhea.     In the ER patient's creatinine is found to be 7. Last creatinine in our system was around 4.7 in 2014 and at that time, he was recommended to have a fistula placed. He was lost to follow up. He was found also to have a Hb of 8 which has dropped to 6.9 with IVF given in ER.    Assessment/Plan:    1. CKD stage V- Now ESRD. Vascular surgery   consulted for tunneled catheter and AV Fistula which was placed on 9/13  . Patient  likely uremic with weight loss.started hemodialysis 9/13. Started CLIP process to [facility]. Receiving hemodialysis starting 9/13 via right IJ catheter  By Elam Dutch, MD.he is likely to start on at TTS schedule as an outpt. Marland Kitchen Next HD Tuesday   2. Anemia with blood positive stool- gastroenterology has been consulted  , CT  abdomen and pelvis with oral contrast, shows worsening bilateral renal cysts, no hydronephrosis, cannot rule out pyelonephritis. Possible pneumonia in the right lower lobe. Patient has had 40 pound weight loss history. Hemoglobin is 9.2 and improving after blood transfusion. EGD on 9/14 showed erosive gastropathy, nonbleeding.  Continue PPI.Received 1 unit PRBCs 03/25/16. Received 150 mcg Aranesp 03/25/16.Plan for inpt colonoscopy Today   by Dr. Alessandra Bevels who is on call this weekend.    3. Diabetes mellitus-held Glucotrol,  glucose is well controlled, continue sliding scale insulin with NovoLog. Change Accu-Cheks to every 6 hours   4. Hypertension-blood pressure is  controlled, continue clonidine, labetalol, Cardizem,   5. Diarrhea- stool for C. difficile is negative, continue Imodium when necessary for diarrhea   6. Syncope- likely from diarrhea, but patient has had 3 episodes of syncope in the recent past.   2-D echo shows EF of 65-70%, no regional wall motion, grade 1 diastolic dysfunction,   normal sinus rhythm on telemetry   7. Elevated troponin- patient had mild elevation of troponin 0.06, which came down to 0.03, no chest pain. EKG showed nonspecific T-wave abnormality and T-wave inversions in lead V2 to V5 . 2-D echo shows EF of 65-70% without wall motion abnormalities. Cardiology consulted  for pre-syncope. Patient may need outpatient event monitor. Cardiology is no further recommendations. Diffuse syncope is due to anemia and hypovolemia, cardiology has signed off   8. probable HCAP-started on  cefepime and   Vancomycin, now switched to augmentin   DVT prophylaxis: Heparin Code Status: Full code Family Communication: No family at bedside Disposition Plan:  Colonoscopy tomorrow   Consultants:  Vascular surgery  Nephrology  GI  Cardiology  Procedures:  EGD 9/14  Ultrasound of neck, Right Internal jugular vein Palindrome catheter, Right Brachial Cephalic AV fistula [id number]  Antibiotics: Augmentin 9/14-9/18 Vancomycin/cefepime 9/13-9/14        Subjective- Feeling well.  hungry , prepped last  night   Objective    Objective: Vitals:   03/31/16 0902 03/31/16 1747 03/31/16 2200 04/01/16 0300  BP: 133/64 (!) 126/55 (!) 146/72 (!) 142/65  Pulse: 74 74 75 70  Resp: 18  18    Temp: 98.3 F (36.8 C) 98.7 F (37.1 C) 98 F (36.7 C) 98.5 F (36.9 C)  TempSrc: Oral Oral Oral Oral  SpO2: 100% 100% 100% 100%  Weight:   58.5 kg (128 lb 15.5 oz)   Height:        Intake/Output Summary (Last 24 hours) at 04/01/16 0838 Last data filed at 04/01/16 0600  Gross per 24 hour  Intake             1160 ml  Output              200  ml  Net              960 ml   Filed Weights   03/30/16 0703 03/30/16 1005 03/31/16 2200  Weight: 60.6 kg (133 lb 9.6 oz) 57.1 kg (125 lb 14.1 oz) 58.5 kg (128 lb 15.5 oz)    Examination:  General exam: Appears calm and comfortable  Respiratory system: Clear to auscultation. Respiratory effort normal. Cardiovascular system: S1 & S2 heard, RRR. No JVD, murmurs, rubs, gallops or clicks. No pedal edema. Gastrointestinal system: Abdomen is nondistended, soft and nontender. No organomegaly or masses felt. Normal bowel sounds heard. Central nervous system: Alert and oriented. No focal neurological deficits. Extremities: Symmetric 5 x 5 power. Skin: No rashes, lesions or ulcers Psychiatry: Judgement and insight appear normal. Mood & affect appropriate.    Data Reviewed: I have personally reviewed following labs and imaging studies Basic Metabolic Panel:  Recent Labs Lab 03/27/16 0517 03/27/16 1858 03/28/16 0949 03/28/16 1255 03/29/16 0659 03/29/16 0856 03/30/16 0718  NA 136 138 138 141 140 138 139  K 3.5 3.8 3.9 3.9 3.9 3.7 3.4*  CL 106 106 107 107 107 106 103  CO2 22 23 25 25 24 24 26   GLUCOSE 119* 133* 84 96 94 110* 141*  BUN 77* 71* 45* 46* 33* 32* 37*  CREATININE 6.89* 6.59* 5.11* 5.27* 4.59* 4.55* 5.21*  CALCIUM 8.0* 8.2* 8.2* 8.3* 8.7* 8.5* 8.4*  PHOS 4.0 3.8  --  3.3  --   --  2.6   Liver Function Tests:  Recent Labs Lab 03/27/16 1858 03/28/16 0949 03/28/16 1255 03/29/16 0659 03/30/16 0718  AST  --  17  --  26  --   ALT  --  10*  --  14*  --   ALKPHOS  --  101  --  111  --   BILITOT  --  0.7  --  0.6  --   PROT  --  5.7*  --  6.4*  --   ALBUMIN 2.9* 2.7* 2.9* 3.1* 2.8*   No results for input(s): LIPASE, AMYLASE in the last 168 hours. No results for input(s): AMMONIA in the last 168 hours. CBC:  Recent Labs Lab 03/28/16 0949 03/28/16 1255 03/29/16 0659 03/30/16 0700 04/01/16 0431  WBC 7.9 7.8 8.4 7.3 5.9  HGB 8.6* 8.9* 9.2* 8.0* 8.1*  HCT 26.2*  27.4* 27.9* 24.3* 25.8*  MCV 75.9* 76.5* 76.6* 77.4* 78.2  PLT 142* 152 141* 123* 149*   Cardiac Enzymes:  Recent Labs Lab 03/25/16 1331 03/25/16 1857  TROPONINI 0.06* 0.03*   BNP (last 3 results) No results for input(s): BNP in the last 8760 hours.  ProBNP (last 3 results) No results for input(s): PROBNP in the last 8760 hours.  CBG:  Recent Labs Lab 03/31/16 0820 03/31/16 1234 03/31/16 1650 03/31/16 2211 04/01/16 0801  GLUCAP 153* 107* 122* 154* 136*  Recent Results (from the past 240 hour(s))  C difficile quick scan w PCR reflex     Status: None   Collection Time: 03/24/16 10:02 PM  Result Value Ref Range Status   C Diff antigen NEGATIVE NEGATIVE Final   C Diff toxin NEGATIVE NEGATIVE Final   C Diff interpretation No C. difficile detected.  Final  Gastrointestinal Panel by PCR , Stool     Status: None   Collection Time: 03/24/16 10:02 PM  Result Value Ref Range Status   Campylobacter species NOT DETECTED NOT DETECTED Final   Plesimonas shigelloides NOT DETECTED NOT DETECTED Final   Salmonella species NOT DETECTED NOT DETECTED Final   Yersinia enterocolitica NOT DETECTED NOT DETECTED Final   Vibrio species NOT DETECTED NOT DETECTED Final   Vibrio cholerae NOT DETECTED NOT DETECTED Final   Enteroaggregative E coli (EAEC) NOT DETECTED NOT DETECTED Final   Enteropathogenic E coli (EPEC) NOT DETECTED NOT DETECTED Final   Enterotoxigenic E coli (ETEC) NOT DETECTED NOT DETECTED Final   Shiga like toxin producing E coli (STEC) NOT DETECTED NOT DETECTED Final   E. coli O157 NOT DETECTED NOT DETECTED Final   Shigella/Enteroinvasive E coli (EIEC) NOT DETECTED NOT DETECTED Final   Cryptosporidium NOT DETECTED NOT DETECTED Final   Cyclospora cayetanensis NOT DETECTED NOT DETECTED Final   Entamoeba histolytica NOT DETECTED NOT DETECTED Final   Giardia lamblia NOT DETECTED NOT DETECTED Final   Adenovirus F40/41 NOT DETECTED NOT DETECTED Final   Astrovirus NOT DETECTED  NOT DETECTED Final   Norovirus GI/GII NOT DETECTED NOT DETECTED Final   Rotavirus A NOT DETECTED NOT DETECTED Final   Sapovirus (I, II, IV, and V) NOT DETECTED NOT DETECTED Final     Studies: No results found.  Scheduled Meds: . [START ON 04/02/2016] calcitRIOL  0.5 mcg Oral Q T,Th,Sa-HD  . cloNIDine  0.1 mg Oral BID  . [START ON 04/02/2016] darbepoetin (ARANESP) injection - DIALYSIS  100 mcg Intravenous Q Tue-HD  . diltiazem  240 mg Oral Daily  . feeding supplement (NEPRO CARB STEADY)  237 mL Oral BID BM  . ferric gluconate (FERRLECIT/NULECIT) IV  125 mg Intravenous Q M,W,F-HD  . heparin subcutaneous  5,000 Units Subcutaneous Q8H  . Influenza vac split quadrivalent PF  0.5 mL Intramuscular Tomorrow-1000  . insulin aspart  0-9 Units Subcutaneous TID WC  . labetalol  200 mg Oral BID  . pantoprazole  40 mg Oral Daily   Continuous Infusions: . sodium chloride         Time spent: 25 min    [facility] Pager [phone]. If 7PM-7AM, please contact night-coverage at [email or web address], Office  [phone]  password [email or web address] 04/01/2016, 8:38 AM  LOS: 7 days

## 2016-04-01 NOTE — Care Management Important Message (Signed)
Important Message  Patient Details  Name: Tim Armstrong MRN: [id number] Date of Birth: 01-23-1946   Medicare Important Message Given:  Yes    Shaquaya Wuellner [address] 04/01/2016, 11:27 AM

## 2016-04-01 NOTE — Transfer of Care (Signed)
Immediate Anesthesia Transfer of Care Note  Patient: Tim Armstrong  Procedure(s) Performed: Procedure(s): COLONOSCOPY (N/A)  Patient Location: Endoscopy Unit  Anesthesia Type:MAC  Level of Consciousness: oriented, patient cooperative and responds to stimulation, drowsy  Airway & Oxygen Therapy: Patient Spontanous Breathing, connected to nasal cannula  Post-op Assessment: Report given to RN and Post -op Vital signs reviewed and stable  Post vital signs: Reviewed and stable  Last Vitals:  Vitals:   04/01/16 1243 04/01/16 1428  BP: (!) 172/78 126/64  Pulse: 73 80  Resp: 11 15  Temp: 36.9 C     Last Pain:  Vitals:   04/01/16 1243  TempSrc: Oral  PainSc:       Patients Stated Pain Goal: 0 ([id number])  Complications: No apparent anesthesia complications

## 2016-04-02 LAB — CBC
HCT: 25.7 % — ABNORMAL LOW (ref 39.0–52.0)
HEMOGLOBIN: 8.3 g/dL — AB (ref 13.0–17.0)
MCH: 25.2 pg — ABNORMAL LOW (ref 26.0–34.0)
MCHC: 32.3 g/dL (ref 30.0–36.0)
MCV: 77.9 fL — ABNORMAL LOW (ref 78.0–100.0)
PLATELETS: 150 10*3/uL (ref 150–400)
RBC: 3.3 MIL/uL — AB (ref 4.22–5.81)
RDW: 17.7 % — ABNORMAL HIGH (ref 11.5–15.5)
WBC: 6.1 10*3/uL (ref 4.0–10.5)

## 2016-04-02 LAB — COMPREHENSIVE METABOLIC PANEL
ALBUMIN: 2.9 g/dL — AB (ref 3.5–5.0)
ALK PHOS: 96 U/L (ref 38–126)
ALT: 14 U/L — ABNORMAL LOW (ref 17–63)
ANION GAP: 10 (ref 5–15)
AST: 20 U/L (ref 15–41)
BILIRUBIN TOTAL: 0.6 mg/dL (ref 0.3–1.2)
BUN: 40 mg/dL — ABNORMAL HIGH (ref 6–20)
CALCIUM: 8.6 mg/dL — AB (ref 8.9–10.3)
CO2: 25 mmol/L (ref 22–32)
Chloride: 100 mmol/L — ABNORMAL LOW (ref 101–111)
Creatinine, Ser: 6.27 mg/dL — ABNORMAL HIGH (ref 0.61–1.24)
GFR calc non Af Amer: 8 mL/min — ABNORMAL LOW (ref >60)
GFR, EST AFRICAN AMERICAN: 9 mL/min — AB (ref >60)
Glucose, Bld: 99 mg/dL (ref 65–99)
POTASSIUM: 4.3 mmol/L (ref 3.5–5.1)
SODIUM: 135 mmol/L (ref 135–145)
TOTAL PROTEIN: 6 g/dL — AB (ref 6.5–8.1)

## 2016-04-02 LAB — GLUCOSE, CAPILLARY
GLUCOSE-CAPILLARY: 85 mg/dL (ref 65–99)
Glucose-Capillary: 107 mg/dL — ABNORMAL HIGH (ref 65–99)

## 2016-04-02 MED ORDER — NEPRO/CARBSTEADY PO LIQD: 237.0000 mL | Freq: Two times a day (BID) | ORAL | 0 refills | Status: DC

## 2016-04-02 MED ORDER — DARBEPOETIN ALFA 100 MCG/0.5ML IJ SOSY
PREFILLED_SYRINGE | INTRAMUSCULAR | Status: AC
  Filled 2016-04-02: qty 0.5

## 2016-04-02 MED ORDER — PANTOPRAZOLE SODIUM 40 MG PO TBEC: 40.0000 mg | DELAYED_RELEASE_TABLET | Freq: Every day | ORAL | 0 refills | Status: DC

## 2016-04-02 MED ORDER — TRAMADOL HCL 50 MG PO TABS: 50.0000 mg | ORAL_TABLET | Freq: Once | ORAL | Status: DC

## 2016-04-02 MED ORDER — SODIUM CHLORIDE 0.9 % IV SOLN
125.0000 mg | INTRAVENOUS | Status: DC
  Administered 2016-04-02: 125 mg via INTRAVENOUS
  Filled 2016-04-02: qty 10

## 2016-04-02 MED ORDER — TAMSULOSIN HCL 0.4 MG PO CAPS: 0.4000 mg | ORAL_CAPSULE | Freq: Every day | ORAL | 1 refills | Status: AC

## 2016-04-02 NOTE — Progress Notes (Signed)
Patient complaining of inability to void and pain in his RLQ of abdomen. Bladder scanned urine in bladder 326 ml. Will update MD.

## 2016-04-02 NOTE — Procedures (Addendum)
Patient CLIP'd to outpatient HD unit.  OK for dc.     I was present at this dialysis session, have reviewed the session itself and made  appropriate changes Kelly Splinter MD [place] pager [phone]    cell [phone] 04/02/2016, 9:26 AM

## 2016-04-02 NOTE — Progress Notes (Signed)
Patient discharged to home. All discharge instructions reviewed. All scripts reviewed. All followup appts reviewed. IV removed. Telemetry removed. Patient dressed and all belongings in tow. Patient left unit in stable condition.   Sheliah Plane RN

## 2016-04-02 NOTE — Progress Notes (Signed)
In and out cath patient. Tolerated procedure well. Urine output 500 ml clear yellow urine no odor.

## 2016-04-02 NOTE — Progress Notes (Signed)
PT Cancellation Note  Patient Details Name: Tim Armstrong MRN: [id number] DOB: 07-May-1946   Cancelled Treatment:    Reason Eval/Treat Not Completed: Patient at procedure or test/unavailable   Currently in HD;  Will follow up later today as time allows;  Otherwise, will follow up for PT tomorrow;   Thank you,  Roney Marion, [place] Pager [phone] Office [phone]     Roney Marion [facility] 04/02/2016, 8:47 AM

## 2016-04-02 NOTE — Discharge Summary (Signed)
Physician Discharge Summary  Tim Armstrong MRN: [id number] DOB/AGE: 1946-04-03 70 y.o.  PCP: Philis Fendt, MD   Admit date: 03/24/2016 Discharge date: 04/02/2016  Discharge Diagnoses:    Principal Problem:   Renal failure (ARF), acute on chronic ([place]) Active Problems:   Diabetes mellitus with renal complications (H[facility])   Hypertension   AKI (acute kidney injury) ([place])   Syncope   Dizziness   Surgery, elective   Weight loss, abnormal   Post-operative state    Follow-up recommendations Follow-up with PCP in 3-5 days , including all  additional recommended appointments as below Follow-up CBC, CMP in 3-5 days CLIP process to [facility] T, TH,S.      Current Discharge Medication List    START taking these medications   Details  Nutritional Supplements (FEEDING SUPPLEMENT, NEPRO CARB STEADY,) LIQD Take 237 mLs by mouth 2 (two) times daily between meals. Qty: 30 Can, Refills: 0    pantoprazole (PROTONIX) 40 MG tablet Take 1 tablet (40 mg total) by mouth daily. Qty: 30 tablet, Refills: 0    tamsulosin (FLOMAX) 0.4 MG CAPS capsule Take 1 capsule (0.4 mg total) by mouth daily after breakfast. Qty: 30 capsule, Refills: 1      CONTINUE these medications which have NOT CHANGED   Details  calcitRIOL (ROCALTROL) 0.25 MCG capsule Take 1 capsule (0.25 mcg total) by mouth daily. Qty: 60 capsule, Refills: 1    cloNIDine (CATAPRES) 0.2 MG tablet Take 0.2 mg by mouth 2 (two) times daily.    diltiazem (TIAZAC) 240 MG 24 hr capsule Take 240 mg by mouth daily.    labetalol (NORMODYNE) 200 MG tablet Take 200 mg by mouth 2 (two) times daily.    lactulose (CHRONULAC) 10 GM/15ML solution Take 20 g by mouth 2 (two) times daily as needed for mild constipation.     polyethylene glycol (MIRALAX / GLYCOLAX) packet Take 17 g by mouth daily as needed for mild constipation.      STOP taking these medications     amLODipine (NORVASC) 10 MG tablet      glipiZIDE  (GLUCOTROL) 10 MG tablet      losartan-hydrochlorothiazide (HYZAAR) 100-25 MG per tablet      potassium chloride (K-DUR) 10 MEQ tablet          Discharge Condition: Stable  Discharge Instructions Get Medicines reviewed and adjusted: Please take all your medications with you for your next visit with your Primary MD  Please request your Primary MD to go over all hospital tests and procedure/radiological results at the follow up, please ask your Primary MD to get all Hospital records sent to his/her office.  If you experience worsening of your admission symptoms, develop shortness of breath, life threatening emergency, suicidal or homicidal thoughts you must seek medical attention immediately by calling 911 or calling your MD immediately if symptoms less severe.  You must read complete instructions/literature along with all the possible adverse reactions/side effects for all the Medicines you take and that have been prescribed to you. Take any new Medicines after you have completely understood and accpet all the possible adverse reactions/side effects.   Do not drive when taking Pain medications.   Do not take more than prescribed Pain, Sleep and Anxiety Medications  Special Instructions: If you have smoked or chewed Tobacco in the last 2 yrs please stop smoking, stop any regular Alcohol and or any Recreational drug use.  Wear Seat belts while driving.  Please note  You were  cared for by a hospitalist during your hospital stay. Once you are discharged, your primary care physician will handle any further medical issues. Please note that NO REFILLS for any discharge medications will be authorized once you are discharged, as it is imperative that you return to your primary care physician (or establish a relationship with a primary care physician if you do not have one) for your aftercare needs so that they can reassess your need for medications and monitor your lab values.  Discharge  Instructions    Diet - low sodium heart healthy    Complete by:  As directed    Increase activity slowly    Complete by:  As directed        Allergies  Allergen Reactions  . Ciprofloxacin Other (See Comments)    HYPOGLYCEMIA      Disposition: 01-Home or Self Care   Consults:  GI Nephrology Vascular surgery     Significant Diagnostic Studies:  Ct Abdomen Pelvis Wo Contrast  Result Date: 03/26/2016 CLINICAL DATA:  Fatigue, diarrhea, and weight loss EXAM: CT ABDOMEN AND PELVIS WITHOUT CONTRAST TECHNIQUE: Multidetector CT imaging of the abdomen and pelvis was performed following the standard protocol without IV contrast. COMPARISON:  July 21, 2012 FINDINGS: Lower chest: There are small bilateral pleural effusions. Cardiomegaly is identified. The left lung base is clear. Mild opacity seen in the right lung base that is series 3, image 10. No suspicious masses Hepatobiliary: No focal liver abnormality is seen. No gallstones, gallbladder wall thickening, or biliary dilatation. Pancreas: Unremarkable. No pancreatic ductal dilatation or surrounding inflammatory changes. Spleen: Normal in size without focal abnormality. Adrenals/Urinary Tract: The adrenal glands are normal. Several tiny stones are seen in the kidneys. Numerous bilateral renal cysts are identified. There are also a few high attenuation rounded regions in the kidneys. These were noted to represent hyperdense cysts on a previous MRI. No suspicious solid masses identified. There is no hydronephrosis. There is moderate to severe perinephric stranding bilaterally which is symmetric. There was perinephric stranding in a similar distribution previously but the finding has worsened. The bladder is unremarkable. Stomach/Bowel: The stomach and small bowel are unremarkable. Colonic diverticulosis is seen without diverticulitis. The appendix is well seen with no appendicitis. Vascular/Lymphatic: Atherosclerotic changes seen in the non  aneurysmal aorta. No adenopathy. Reproductive: The prostate is enlarged. Other: No free air or free fluid. Musculoskeletal: Degenerative changes seen in the thoracic and lumbar spine. No acute bony abnormalities. IMPRESSION: 1. Worsening cysts and hyperdense cysts in the kidneys in the interval. There is no hydronephrosis to suggest obstruction. Nonobstructive renal stones. There is bilateral symmetric perinephric stranding which is moderate to severe and significantly worsened since the previous study. However, the distribution of the stranding was similar on the previous study. I suspect the stranding is simply due to the underlying cause of renal insufficiency such as nephritis. The stranding could be seen with an acute process such as pyelonephritis but the symmetry and lack of obvious renal edema suggests this is less likely. Recommend clinical correlation and correlation with urinalysis. 2. Small bilateral pleural effusions.  Cardiomegaly. 3. Mild opacity in the right lung base may represent pneumonia. Recommend follow-up to resolution. 4. Atherosclerotic change in the aorta. Electronically Signed   By: Dorise Bullion III M.D   On: 03/26/2016 20:46   Dg Chest Port 1 View  Result Date: 03/27/2016 CLINICAL DATA:  Postoperative radiograph. Acute on chronic renal failure EXAM: PORTABLE CHEST 1 VIEW COMPARISON:  Portable chest x-ray of  March 25, 2016 FINDINGS: The lungs are well-expanded and clear. There has been interval placement of a dual-lumen venous catheter via the right internal jugular approach. The tip overlies the midportion of the SVC. There is no pneumothorax or hemo thorax. The heart and pulmonary vascularity are normal. There is tortuosity of the ascending and descending thoracic aorta. IMPRESSION: No acute cardiopulmonary abnormality. Interval placement of a dual-lumen right internal jugular venous catheter. Electronically Signed   By: David  Martinique M.D.   On: 03/27/2016 15:53   Dg Chest  Port 1 View  Result Date: 03/25/2016 CLINICAL DATA:  70 year old male with a history of dizziness EXAM: PORTABLE CHEST 1 VIEW COMPARISON:  08/06/2012 FINDINGS: Compare to the prior plain film there is increased widening of the mediastinum at the level of the vascular pedicle, with double density of the contours along the right mediastinal border, compatible with prominent right aspect of the ascending aorta. Calcifications of the aortic arch. No central vascular congestion. No pneumothorax or pleural effusion. No confluent airspace disease. No displaced fracture. IMPRESSION: No acute cardiopulmonary disease. Compare to the prior x-ray of 08/06/2012 there is increased prominence of the mediastinal width, with either tortuosity of the ascending aorta or developing aneurysm/ectasia. Further evaluation with CTA chest recommended. These results will be called to the ordering clinician or representative by the Radiologist Assistant, and communication documented in the PACS or zVision Dashboard. Signed, Dulcy Fanny. Earleen Newport, DO [facility] Radiology Electronically Signed   By: Corrie Mckusick D.O.   On: 03/25/2016 08:08   Dg Fluoro Guide Cv Line-no Report  Result Date: 03/27/2016 CLINICAL DATA:  FLOURO GUIDE CV LINE Fluoroscopy was utilized by the requesting physician.  No radiographic interpretation.       EGD 9/14 Normal larynx. - Small hiatal hernia.    Colonoscopy 9/18 Polypoid/mass lesion at the hepatic flexure. Biopsied. Injected. - One 15 mm polyp in the descending colon, removed with a hot snare. Resected and retrieved. - Diverticulosis in the sigmoid colon. - Internal hemorrhoids.   Filed Weights   03/31/16 2200 04/01/16 2052 04/02/16 0800  Weight: 58.5 kg (128 lb 15.5 oz) 63.5 kg (139 lb 14.4 oz) 60.6 kg (133 lb 9.6 oz)     Microbiology: Recent Results (from the past 240 hour(s))  C difficile quick scan w PCR reflex     Status: None    Collection Time: 03/24/16 10:02 PM  Result Value Ref Range Status   C Diff antigen NEGATIVE NEGATIVE Final   C Diff toxin NEGATIVE NEGATIVE Final   C Diff interpretation No C. difficile detected.  Final  Gastrointestinal Panel by PCR , Stool     Status: None   Collection Time: 03/24/16 10:02 PM  Result Value Ref Range Status   Campylobacter species NOT DETECTED NOT DETECTED Final   Plesimonas shigelloides NOT DETECTED NOT DETECTED Final   Salmonella species NOT DETECTED NOT DETECTED Final   Yersinia enterocolitica NOT DETECTED NOT DETECTED Final   Vibrio species NOT DETECTED NOT DETECTED Final   Vibrio cholerae NOT DETECTED NOT DETECTED Final   Enteroaggregative E coli (EAEC) NOT DETECTED NOT DETECTED Final   Enteropathogenic E coli (EPEC) NOT DETECTED NOT DETECTED Final   Enterotoxigenic E coli (ETEC) NOT DETECTED NOT DETECTED Final   Shiga like toxin producing E coli (STEC) NOT DETECTED NOT DETECTED Final   E. coli O157 NOT DETECTED NOT DETECTED Final   Shigella/Enteroinvasive E coli (EIEC) NOT DETECTED NOT DETECTED Final   Cryptosporidium NOT DETECTED  NOT DETECTED Final   Cyclospora cayetanensis NOT DETECTED NOT DETECTED Final   Entamoeba histolytica NOT DETECTED NOT DETECTED Final   Giardia lamblia NOT DETECTED NOT DETECTED Final   Adenovirus F40/41 NOT DETECTED NOT DETECTED Final   Astrovirus NOT DETECTED NOT DETECTED Final   Norovirus GI/GII NOT DETECTED NOT DETECTED Final   Rotavirus A NOT DETECTED NOT DETECTED Final   Sapovirus (I, II, IV, and V) NOT DETECTED NOT DETECTED Final       Blood Culture    Component Value Date/Time   SDES BLOOD HAND RIGHT 08/02/2012 0840   SPECREQUEST BOTTLES DRAWN AEROBIC AND ANAEROBIC 10CC EACH 08/02/2012 0840   CULT NO GROWTH 5 DAYS 08/02/2012 0840   REPTSTATUS 08/08/2012 FINAL 08/02/2012 0840      Labs: Results for orders placed or performed during the hospital encounter of 03/24/16 (from the past 48 hour(s))  Glucose, capillary      Status: Abnormal   Collection Time: 03/31/16 12:34 PM  Result Value Ref Range   Glucose-Capillary 107 (H) 65 - 99 mg/dL  Glucose, capillary     Status: Abnormal   Collection Time: 03/31/16  4:50 PM  Result Value Ref Range   Glucose-Capillary 122 (H) 65 - 99 mg/dL  Glucose, capillary     Status: Abnormal   Collection Time: 03/31/16 10:11 PM  Result Value Ref Range   Glucose-Capillary 154 (H) 65 - 99 mg/dL  CBC     Status: Abnormal   Collection Time: 04/01/16  4:31 AM  Result Value Ref Range   WBC 5.9 4.0 - 10.5 K/uL   RBC 3.30 (L) 4.22 - 5.81 MIL/uL   Hemoglobin 8.1 (L) 13.0 - 17.0 g/dL   HCT 25.8 (L) 39.0 - 52.0 %   MCV 78.2 78.0 - 100.0 fL   MCH 24.5 (L) 26.0 - 34.0 pg   MCHC 31.4 30.0 - 36.0 g/dL   RDW 17.6 (H) 11.5 - 15.5 %   Platelets 149 (L) 150 - 400 K/uL  Glucose, capillary     Status: Abnormal   Collection Time: 04/01/16  8:01 AM  Result Value Ref Range   Glucose-Capillary 136 (H) 65 - 99 mg/dL  Glucose, capillary     Status: Abnormal   Collection Time: 04/01/16 11:13 AM  Result Value Ref Range   Glucose-Capillary 117 (H) 65 - 99 mg/dL  Glucose, capillary     Status: Abnormal   Collection Time: 04/01/16  4:41 PM  Result Value Ref Range   Glucose-Capillary 131 (H) 65 - 99 mg/dL  Glucose, capillary     Status: Abnormal   Collection Time: 04/01/16  8:50 PM  Result Value Ref Range   Glucose-Capillary 183 (H) 65 - 99 mg/dL  Comprehensive metabolic panel     Status: Abnormal   Collection Time: 04/02/16  3:33 AM  Result Value Ref Range   Sodium 135 135 - 145 mmol/L   Potassium 4.3 3.5 - 5.1 mmol/L   Chloride 100 (L) 101 - 111 mmol/L   CO2 25 22 - 32 mmol/L   Glucose, Bld 99 65 - 99 mg/dL   BUN 40 (H) 6 - 20 mg/dL   Creatinine, Ser 6.27 (H) 0.61 - 1.24 mg/dL   Calcium 8.6 (L) 8.9 - 10.3 mg/dL   Total Protein 6.0 (L) 6.5 - 8.1 g/dL   Albumin 2.9 (L) 3.5 - 5.0 g/dL   AST 20 15 - 41 U/L   ALT 14 (L) 17 - 63 U/L   Alkaline Phosphatase 96 38 -  126 U/L   Total  Bilirubin 0.6 0.3 - 1.2 mg/dL   GFR calc non Af Amer 8 (L) >60 mL/min   GFR calc Af Amer 9 (L) >60 mL/min    Comment: (NOTE) The eGFR has been calculated using the CKD EPI equation. This calculation has not been validated in all clinical situations. eGFR's persistently <60 mL/min signify possible Chronic Kidney Disease.    Anion gap 10 5 - 15  Glucose, capillary     Status: Abnormal   Collection Time: 04/02/16  7:28 AM  Result Value Ref Range   Glucose-Capillary 107 (H) 65 - 99 mg/dL  CBC     Status: Abnormal   Collection Time: 04/02/16  9:48 AM  Result Value Ref Range   WBC 6.1 4.0 - 10.5 K/uL   RBC 3.30 (L) 4.22 - 5.81 MIL/uL   Hemoglobin 8.3 (L) 13.0 - 17.0 g/dL   HCT 25.7 (L) 39.0 - 52.0 %   MCV 77.9 (L) 78.0 - 100.0 fL   MCH 25.2 (L) 26.0 - 34.0 pg   MCHC 32.3 30.0 - 36.0 g/dL   RDW 17.7 (H) 11.5 - 15.5 %   Platelets 150 150 - 400 K/uL     Lipid Panel  No results found for: CHOL, TRIG, HDL, CHOLHDL, VLDL, LDLCALC, LDLDIRECT   Lab Results  Component Value Date   HGBA1C 6.5 (H) 08/02/2012     Lab Results  Component Value Date   CREATININE 6.27 (H) 04/02/2016     HPI : *  Mr. Docter is a 70 yo male with PMH of  DM II, HTN, bilateral renal masses and CKD V. Does not appear that he has ever been seen by cardiology in the past.   He presented to the [facility] ED with reports of hypoglycemia, weakness and light-headedness while walking around the festival in downtown [place] on 9/11.Does report over the past couple of weeks he has been feeling fatigued and reports multiple episodes of diarrhea. Denies any melena , or hematemesis.  States about a week prior to admission he was in [place] when he experienced and episode similar to that of this admission, and he almost fell down and escalator.    In the ED his labs showed a Cr of 7, which was last noted in the system in 2014 at 4.7. Appears he was to follow up possible plans for dialysis in the future but did not  follow through. Other labs showed stable electrolytes, but Hgb 8.2, with positive FOBT. EKG showed SR with non-specific T wave abnormalities noted on previous tracings. Trops cycled with mild elevation of 0.06. He was admitted to Internal Medicine for further work up.   Since admission he required 1 unit PRBC after a drop in Hgb to 6.9. He has been seen by nephrology and plans initiated to begin hemodialysis given his sustained elevation in Cr.     Hospital course  1. CKD stage V- Now ESRD. Vascular surgery   consulted for tunneled catheter and AV Fistula which was placed on 9/13  . Patient  likely uremic with weight loss.started hemodialysis 9/13. Started CLIP process to [facility]. Receiving hemodialysis starting 9/13 via right IJ catheter  By Elam Dutch, MD.he is likely to start on at TTS schedule as an outpt. Patient to follow-up with vascular surgery as scheduled in the outpatient setting   2. Anemia with blood positive stool- gastroenterology has been consulted  , CT  abdomen and pelvis with oral contrast,  shows worsening bilateral renal cysts, no hydronephrosis, cannot rule out pyelonephritis. Possible pneumonia in the right lower lobe. Patient has had 40 pound weight loss history. Hemoglobin is 9.2 and improving after blood transfusion. EGD on 9/14 showed erosive gastropathy, nonbleeding.  Continue PPI.Received 1 unit PRBCs 03/25/16. Received 150 mcg Aranesp 03/25/16.Plan for inpt colonoscopy 9/18 results  Polypoid/mass lesion at the hepatic flexure. Biopsied. Injected. One 15 mm polyp in the descending colon, removed with a hot snare. Patient given written instructions to call GI for pathology results, to refrain from taking NSAIDs.   3. Diabetes mellitus-held Glucotrol,   no hemoglobin A1c this admission. Patient was maintained on sliding scale insulin. glucose is well controlled, continue sliding scale insulin with NovoLog.  will resume Glucotrol at a lower  dose  4. Hypertension-blood pressure is controlled, continue clonidine, labetalol, Cardizem, discontinued Norvasc, lisinopril/HCTZ   5. Diarrhea- stool for C. difficile is negative, continue Imodium when necessary for diarrhea   6. Syncope- likely from diarrhea, but patient has had 3 episodes of syncope in the recent past.   2-D echo shows EF of 65-70%, no regional wall motion, grade 1 diastolic dysfunction,   normal sinus rhythm on telemetry   7. Elevated troponin- patient had mild elevation of troponin 0.06, which came down to 0.03, no chest pain. EKG showed nonspecific T-wave abnormality and T-wave inversions in lead V2 to V5 . 2-D echo shows EF of 65-70% without wall motion abnormalities. Cardiology consulted  for pre-syncope. Patient may need outpatient event monitor. Cardiology has no further recommendations. Diffuse syncope is due to anemia and hypovolemia, cardiology has signed off   8. probable HCAP-started on  cefepime and   Vancomycin, then switched to augmentin which he has completed    Discharge Exam:   Blood pressure (!) 147/83, pulse 78, temperature 98.7 F (37.1 C), temperature source Oral, resp. rate 14, height 5' 11"  (1.803 m), weight 60.6 kg (133 lb 9.6 oz), SpO2 99 %.      Follow-up Information    Ruta Hinds, MD Follow up in 4 week(s).   Specialties:  Vascular Surgery, Cardiology Why:  Our office will call you to arrange an appointment  Contact information: [place] [address] [phone]        Cassell Clement, MD. Schedule an appointment as soon as possible for a visit in 1 week(s).   Specialty:  Gastroenterology Why:  Call office to make follow-up appointment Contact information: [address] [address] [phone]        Philis Fendt, MD. Schedule an appointment as soon as possible for a visit in 2 day(s).   Specialty:  Internal Medicine Why:  Hospital follow-up Contact  information: [place] [address] [phone]           Signed: Reyne Dumas 04/02/2016, 11:35 AM        Time spent >45 mins

## 2016-04-02 NOTE — Discharge Instructions (Signed)
Resume regular diet. - Continue present medications. - No aspirin, ibuprofen, naproxen, or other non-steroidal anti-inflammatory drugs for 10 days after polyp removal. - Await pathology results. - Repeat colonoscopy is recommended. The colonoscopy date will be determined after pathology results from today's exam become available for review.

## 2016-04-15 ENCOUNTER — Telehealth: Payer: Self-pay | Admitting: Vascular Surgery

## 2016-04-15 NOTE — Telephone Encounter (Signed)
-----   Message from Mena Goes, RN sent at 03/28/2016  9:16 AM EDT ----- Regarding: schedule postop w/ duplex   ----- Message ----- From: Alvia Grove, PA-C Sent: 03/28/2016   8:06 AM To: Vvs Charge Pool  S/p right brachial cephalic AVF [id number]  F/u in 4 weeks with CEF and duplex  Thanks Maudie Mercury

## 2016-04-15 NOTE — Telephone Encounter (Signed)
Mailed letter for pts f/u 10/26 w/US to see Doc

## 2016-05-01 ENCOUNTER — Other Ambulatory Visit: Payer: Self-pay | Admitting: Surgery

## 2016-05-09 ENCOUNTER — Encounter (HOSPITAL_COMMUNITY): Payer: Medicare Other

## 2016-05-09 ENCOUNTER — Ambulatory Visit: Payer: Medicare Other | Admitting: Vascular Surgery

## 2016-05-14 ENCOUNTER — Telehealth: Payer: Self-pay

## 2016-05-14 NOTE — Telephone Encounter (Signed)
Notified pt. of Dr. Oneida Alar response re: being evaluated while in hospital for his partial colectomy.  Verb. Understanding.    RE: follow up appt vs hospital check  Received: Today  Message Contents  Elam Dutch, MD  Denman George, RN        Just have him call the on call MD when he is in the hospital   [facility]

## 2016-05-14 NOTE — Telephone Encounter (Signed)
Called pt. re: his request to have Right Arm AVF checked while he is in the hospital next week.  Advised that he will need to have an ultrasound of the right arm access, and that this could be arranged after discharge from his surgery on 11/6.  The pt. requested to have the doctor check this while he is in the hospital.  Advised will discuss with Dr. Oneida Alar.

## 2016-05-14 NOTE — Telephone Encounter (Signed)
-----   Message from Rufina Falco sent at 05/14/2016  9:48 AM EDT ----- Regarding: Patient Question I called Tim Armstrong to remind him of his ultrasound appointment for tomorrow.   He cancelled.  He states he is too weak to come into the office.   He wants the provider to consult on him while he is admitted next week.  I tried to explain to him that I didn't think that was possible.  CEF, s/u 4 wks, S/p right brachial cephalic AVF [id number]  Please call him at [place]

## 2016-05-15 ENCOUNTER — Encounter (HOSPITAL_COMMUNITY): Payer: Medicare HMO

## 2016-05-17 ENCOUNTER — Encounter (HOSPITAL_COMMUNITY): Payer: Self-pay | Admitting: *Deleted

## 2016-05-17 NOTE — Progress Notes (Signed)
Anesthesia Chart Review:  Pt is a same day work up.   Pt is a 70 year old male scheduled for laparoscopic assisted partial colectomy on 05/20/2016 with Coralie Keens, MD.   PMH includes:  HTN, DM, ESRD on dialysis), colon cancer.  Former smoker. BMI 18  Pt hospitalized 9/10-9/19/17 for pre-syncope, acute on chronic renal failure, anemia associated with blood in stool (s/p 1 unit PRBC), colon mass (later confirmed as cancer). Dialysis catheter placed, pt started on dialysis. Pt also had mild elevation troponin, T wave inversions V2-V5.  2-D echo showed EF of 65-70% without wall motion abnormalities. Cardiology consulted for pre-syncope. Patient may need outpatient event monitor. Cardiology has no further recommendations. Diffuse syncope is due to anemia and hypovolemia, cardiology signed off  Medications include: clonidine, diltiazem, labetalol  Labs will be obtained DOS.   1 view CXR 03/27/16: No acute cardiopulmonary abnormality. Interval placement of a dual-lumen right internal jugular venous catheter.  EKG 03/25/16: Sinus rhythm with PACs. T wave abnormality, consider anterior ischemia. T wave inversions new since previous EKG 08/06/12.  Done in the setting of acute kidney injury, acute anemia, pre-syncope.  Will repeat DOS.   Echo 03/29/16:  - Left ventricle: The cavity size was normal. There was mild concentric hypertrophy. Systolic function was vigorous. The estimated ejection fraction was in the range of 65% to 70%. Wall motion was normal; there were no regional wall motion abnormalities. Doppler parameters are consistent with abnormal   left ventricular relaxation (grade 1 diastolic dysfunction). - Mitral valve: Calcified annulus. There was mild regurgitation. - Left atrium: The atrium was mildly dilated. - Pulmonary arteries: Systolic pressure was mildly increased.  If no changes, I anticipate pt can proceed with surgery as scheduled.   Willeen Cass, FNP-BC [facility] Short Stay  Surgical Center/Anesthesiology Phone: [phone] 05/17/2016 3:37 PM

## 2016-05-17 NOTE — Progress Notes (Signed)
Pt denies SOB, chest pain, and being under the care of a cardiologist. Pt made aware to drink plenty of clears on day of prep to avoid dehydration  Pt stated that a stress test was done >10 years ago but denies having a cardiac cath. Pt made aware to stop taking Aspirin, vitamins, fish oil and herbal medications. Do not take any NSAIDs ie: Ibuprofen, Advil, Naproxen, BC and Goody Powder. Pt stated that his fasting blood glucose is " usually in the 100's. Pt made aware of diabetes protocol to check blood glucose ( BG) every 2 hours prior to arrival, interventions for a BG <70 and >220 and phone number to SS. Pt verbalized understanding of all pre-op instructions. Anesthesia asked to review pt EKG.

## 2016-05-19 NOTE — H&P (Signed)
Tim Armstrong 05/01/2016 2:39 PM Location: [facility] Patient #: [id number] DOB: November 01, 1945 Married / Language: English / Race: Black or African American Male   History of Present Illness (Brandee Markin A. Ninfa Linden MD; 05/01/2016 3:05 PM) The patient is a 70 year old male who presents with a colonic mass. This gentleman is referred by Dr. Levi Aland after the recent diagnosis of colon cancer. This is found on colonoscopy for anemia. It was located in the hepatic flexure. He has had a CT of his abdomen which was otherwise unremarkable. He currently reports normal bowel movements. He has had no nausea or vomiting. He has a lot of chronic medical problems and is on permanent dialysis. He has no previous history of colon cancer. Currently, he is otherwise without complaints.   Other Problems Marjean Donna, CMA; 05/01/2016 2:39 PM) Anxiety Disorder Back Pain Cerebrovascular Accident Chronic Renal Failure Syndrome Colon Cancer Depression Diabetes Mellitus High blood pressure Migraine Headache Vascular Disease  Past Surgical History Marjean Donna, [place]; 05/01/2016 2:39 PM) Dialysis Shunt / Fistula Foot Surgery Bilateral.  Diagnostic Studies History Marjean Donna, CMA; 05/01/2016 2:39 PM) Colonoscopy within last year  Allergies ([place], [place]; 05/01/2016 2:40 PM) Ciprofloxacin *CHEMICALS*  Medication History (Sonya Bynum, CMA; 05/01/2016 2:40 PM) Tamsulosin HCl (0.4MG  Capsule, Oral) Active. Losartan Potassium (100MG  Tablet, Oral) Active. GlipiZIDE ER (10MG  Tablet ER 24HR, Oral) Active. Cartia XT (240MG  Capsule ER 24HR, Oral) Active. CloNIDine HCl (0.2MG  Tablet, Oral) Active. Pantoprazole Sodium (40MG  Tablet DR, Oral) Active. HydrALAZINE HCl (50MG  Tablet, Oral) Active. Labetalol HCl (200MG  Tablet, Oral) Active. Medications Reconciled  Social History Marjean Donna, CMA; 05/01/2016 2:39 PM) Caffeine use Carbonated beverages, Tea. No alcohol  use Tobacco use Former smoker.  Family History Marjean Donna, [place]; 05/01/2016 2:39 PM) Alcohol Abuse Brother. Kidney Disease Brother. Prostate Cancer Father.    Review of Systems ([place]; 05/01/2016 2:39 PM) Skin Present- Dryness and Rash. Not Present- Change in Wart/Mole, Hives, Jaundice, New Lesions, Non-Healing Wounds and Ulcer. Musculoskeletal Present- Joint Stiffness. Not Present- Back Pain, Joint Pain, Muscle Pain, Muscle Weakness and Swelling of Extremities. Neurological Present- Weakness. Not Present- Decreased Memory, Fainting, Headaches, Numbness, Seizures, Tingling, Tremor and Trouble walking. Psychiatric Present- Anxiety. Not Present- Bipolar, Change in Sleep Pattern, Depression, Fearful and Frequent crying.  Vitals (Sonya Bynum CMA; 05/01/2016 2:39 PM) 05/01/2016 2:39 PM Weight: 132 lb Height: 66in Body Surface Area: 1.68 m Body Mass Index: 21.31 kg/m  Temp.: 45F(Temporal)  Pulse: 81 (Regular)  BP: 124/74 (Sitting, Left Arm, Standard)       Physical Exam ([place]ulden Calise A. Ninfa Linden MD; 05/01/2016 3:05 PM) General Mental Status-Alert. General Appearance-Consistent with stated age. Hydration-Well hydrated. Voice-Normal.  Head and Neck Head-normocephalic, atraumatic with no lesions or palpable masses. Trachea-midline. Thyroid Gland Characteristics - normal size and consistency.  Eye Eyeball - Bilateral-Extraocular movements intact. Sclera/Conjunctiva - Bilateral-No scleral icterus.  Chest and Lung Exam Chest and lung exam reveals -quiet, even and easy respiratory effort with no use of accessory muscles and on auscultation, normal breath sounds, no adventitious sounds and normal vocal resonance. Inspection Chest Wall - Normal. Back - normal.  Breast - Did not examine.  Cardiovascular Cardiovascular examination reveals -normal heart sounds, regular rate and rhythm with no murmurs and normal pedal pulses  bilaterally.  Abdomen Inspection Inspection of the abdomen reveals - No Hernias. Skin - Scar - no surgical scars. Palpation/Percussion Palpation and Percussion of the abdomen reveal - Soft, Non Tender, No Rebound tenderness, No Rigidity (guarding) and No hepatosplenomegaly. Auscultation Auscultation  of the abdomen reveals - Bowel sounds normal.  Neurologic Neurologic evaluation reveals -alert and oriented x 3 with no impairment of recent or remote memory. Mental Status-Normal.  Musculoskeletal Normal Exam - Left-Upper Extremity Strength Normal and Lower Extremity Strength Normal. Normal Exam - Right-Upper Extremity Strength Normal and Lower Extremity Strength Normal.  Lymphatic Head & Neck  General Head & Neck Lymphatics: Bilateral - Description - Normal. Axillary  General Axillary Region: Bilateral - Description - Normal. Tenderness - Non Tender. Femoral & Inguinal  Generalized Femoral & Inguinal Lymphatics: Bilateral - Description - Normal. Tenderness - Non Tender.    Assessment & Plan (Dermot Gremillion A. Ninfa Linden MD; 05/01/2016 3:06 PM) COLON CANCER (C18.9) Impression: I discussed the diagnosis with the patient and his wife in detail. I gave him literature regarding colon Surgery. I would recommend a laparoscopic assisted partial colectomy. I discussed the surgical procedure with him in detail. I discussed the risks of surgery with him as well. These risks include but are not limited to bleeding, infection, injury to surrounding structures, anastomotic leak, the need to convert to an open procedure, need for further surgery, cardiopulmonary issues, DVT, postoperative recovery, etc. Preoperative bowel preparation will be ordered. He understands and wished to proceed with surgery

## 2016-05-20 ENCOUNTER — Inpatient Hospital Stay (HOSPITAL_COMMUNITY): Payer: Medicare HMO | Admitting: Emergency Medicine

## 2016-05-20 ENCOUNTER — Encounter (HOSPITAL_COMMUNITY): Payer: Self-pay

## 2016-05-20 ENCOUNTER — Encounter (HOSPITAL_COMMUNITY)
Admission: RE | Disposition: A | Discharge status: Home Health | Payer: Self-pay | Source: Ambulatory Visit | Attending: Surgery

## 2016-05-20 ENCOUNTER — Inpatient Hospital Stay (HOSPITAL_COMMUNITY)
Admission: RE | Admit: 2016-05-20 | Discharge: 2016-05-26 | DRG: 329 | Disposition: A | Discharge status: Home Health | Payer: Medicare HMO | Source: Ambulatory Visit | Attending: Surgery | Admitting: Surgery

## 2016-05-20 DIAGNOSIS — Z841 Family history of disorders of kidney and ureter: Secondary | ICD-10-CM

## 2016-05-20 DIAGNOSIS — Z992 Dependence on renal dialysis: Secondary | ICD-10-CM

## 2016-05-20 DIAGNOSIS — F419 Anxiety disorder, unspecified: Secondary | ICD-10-CM | POA: Diagnosis present

## 2016-05-20 DIAGNOSIS — E8889 Other specified metabolic disorders: Secondary | ICD-10-CM | POA: Diagnosis present

## 2016-05-20 DIAGNOSIS — D649 Anemia, unspecified: Secondary | ICD-10-CM | POA: Diagnosis present

## 2016-05-20 DIAGNOSIS — Z833 Family history of diabetes mellitus: Secondary | ICD-10-CM

## 2016-05-20 DIAGNOSIS — F329 Major depressive disorder, single episode, unspecified: Secondary | ICD-10-CM | POA: Diagnosis present

## 2016-05-20 DIAGNOSIS — Z8042 Family history of malignant neoplasm of prostate: Secondary | ICD-10-CM

## 2016-05-20 DIAGNOSIS — Z8249 Family history of ischemic heart disease and other diseases of the circulatory system: Secondary | ICD-10-CM | POA: Diagnosis not present

## 2016-05-20 DIAGNOSIS — Z8673 Personal history of transient ischemic attack (TIA), and cerebral infarction without residual deficits: Secondary | ICD-10-CM | POA: Diagnosis not present

## 2016-05-20 DIAGNOSIS — I120 Hypertensive chronic kidney disease with stage 5 chronic kidney disease or end stage renal disease: Secondary | ICD-10-CM | POA: Diagnosis present

## 2016-05-20 DIAGNOSIS — E1122 Type 2 diabetes mellitus with diabetic chronic kidney disease: Secondary | ICD-10-CM | POA: Diagnosis present

## 2016-05-20 DIAGNOSIS — Z87891 Personal history of nicotine dependence: Secondary | ICD-10-CM

## 2016-05-20 DIAGNOSIS — N186 End stage renal disease: Secondary | ICD-10-CM | POA: Diagnosis present

## 2016-05-20 DIAGNOSIS — C189 Malignant neoplasm of colon, unspecified: Secondary | ICD-10-CM | POA: Diagnosis present

## 2016-05-20 DIAGNOSIS — Z811 Family history of alcohol abuse and dependence: Secondary | ICD-10-CM | POA: Diagnosis not present

## 2016-05-20 HISTORY — DX: Malignant (primary) neoplasm, unspecified: C80.1

## 2016-05-20 HISTORY — PX: LAPAROSCOPIC PARTIAL COLECTOMY: SHX5907

## 2016-05-20 LAB — BASIC METABOLIC PANEL
Anion gap: 16 — ABNORMAL HIGH (ref 5–15)
BUN: 35 mg/dL — AB (ref 6–20)
CALCIUM: 9.7 mg/dL (ref 8.9–10.3)
CO2: 26 mmol/L (ref 22–32)
Chloride: 93 mmol/L — ABNORMAL LOW (ref 101–111)
Creatinine, Ser: 7.31 mg/dL — ABNORMAL HIGH (ref 0.61–1.24)
GFR calc Af Amer: 8 mL/min — ABNORMAL LOW (ref >60)
GFR, EST NON AFRICAN AMERICAN: 7 mL/min — AB (ref >60)
GLUCOSE: 201 mg/dL — AB (ref 65–99)
Potassium: 4.4 mmol/L (ref 3.5–5.1)
Sodium: 135 mmol/L (ref 135–145)

## 2016-05-20 LAB — TYPE AND SCREEN
ABO/RH(D): B POS
ANTIBODY SCREEN: NEGATIVE

## 2016-05-20 LAB — CBC
HEMATOCRIT: 43.1 % (ref 39.0–52.0)
HEMOGLOBIN: 14.8 g/dL (ref 13.0–17.0)
MCH: 25.6 pg — AB (ref 26.0–34.0)
MCHC: 34.3 g/dL (ref 30.0–36.0)
MCV: 74.7 fL — ABNORMAL LOW (ref 78.0–100.0)
Platelets: 149 10*3/uL — ABNORMAL LOW (ref 150–400)
RBC: 5.77 MIL/uL (ref 4.22–5.81)
RDW: 16 % — AB (ref 11.5–15.5)
WBC: 6.5 10*3/uL (ref 4.0–10.5)

## 2016-05-20 LAB — GLUCOSE, CAPILLARY
GLUCOSE-CAPILLARY: 143 mg/dL — AB (ref 65–99)
Glucose-Capillary: 195 mg/dL — ABNORMAL HIGH (ref 65–99)
Glucose-Capillary: 174 mg/dL — ABNORMAL HIGH (ref 65–99)
Glucose-Capillary: 198 mg/dL — ABNORMAL HIGH (ref 65–99)

## 2016-05-20 SURGERY — LAPAROSCOPIC PARTIAL COLECTOMY
Anesthesia: General | Site: Abdomen

## 2016-05-20 MED ORDER — CEFOTETAN DISODIUM-DEXTROSE 2-2.08 GM-% IV SOLR
INTRAVENOUS | Status: AC
  Filled 2016-05-20: qty 50

## 2016-05-20 MED ORDER — PHENYLEPHRINE HCL 10 MG/ML IJ SOLN
INTRAMUSCULAR | Status: DC | PRN
  Administered 2016-05-20: 80 ug via INTRAVENOUS
  Administered 2016-05-20: 120 ug via INTRAVENOUS

## 2016-05-20 MED ORDER — FENTANYL CITRATE (PF) 100 MCG/2ML IJ SOLN
INTRAMUSCULAR | Status: AC
  Filled 2016-05-20: qty 2

## 2016-05-20 MED ORDER — SUGAMMADEX SODIUM 200 MG/2ML IV SOLN
INTRAVENOUS | Status: DC | PRN
  Administered 2016-05-20: 150 mg via INTRAVENOUS

## 2016-05-20 MED ORDER — ONDANSETRON HCL 4 MG/2ML IJ SOLN: 4.0000 mg | Freq: Four times a day (QID) | INTRAMUSCULAR | Status: DC | PRN

## 2016-05-20 MED ORDER — SODIUM CHLORIDE 0.9 % IV SOLN
INTRAVENOUS | Status: DC
  Administered 2016-05-21 – 2016-05-22 (×3): via INTRAVENOUS
  Administered 2016-05-23: 50 mL via INTRAVENOUS

## 2016-05-20 MED ORDER — LABETALOL HCL 200 MG PO TABS
200.0000 mg | ORAL_TABLET | Freq: Two times a day (BID) | ORAL | Status: DC
  Administered 2016-05-20 – 2016-05-26 (×10): 200 mg via ORAL
  Filled 2016-05-20 (×11): qty 1

## 2016-05-20 MED ORDER — CHLORHEXIDINE GLUCONATE CLOTH 2 % EX PADS: 6.0000 | MEDICATED_PAD | Freq: Once | CUTANEOUS | Status: DC

## 2016-05-20 MED ORDER — MORPHINE SULFATE 2 MG/ML IV SOLN
INTRAVENOUS | Status: DC
  Administered 2016-05-20: 17:00:00 via INTRAVENOUS
  Administered 2016-05-22: 4 mg via INTRAVENOUS
  Administered 2016-05-22: 5.59 mg via INTRAVENOUS
  Administered 2016-05-22: 2 mg via INTRAVENOUS
  Administered 2016-05-22: 6.4 mg via INTRAVENOUS
  Administered 2016-05-23: 2 mg via INTRAVENOUS
  Administered 2016-05-23: 3 mg via INTRAVENOUS
  Administered 2016-05-23: 07:00:00 via INTRAVENOUS
  Administered 2016-05-23: 8 mg via INTRAVENOUS
  Administered 2016-05-23 (×2): 7 mg via INTRAVENOUS
  Administered 2016-05-23 – 2016-05-24 (×3): 6 mg via INTRAVENOUS
  Filled 2016-05-20 (×2): qty 25

## 2016-05-20 MED ORDER — BUPIVACAINE HCL (PF) 0.25 % IJ SOLN
INTRAMUSCULAR | Status: DC | PRN
  Administered 2016-05-20: 20 mL

## 2016-05-20 MED ORDER — PROPOFOL 10 MG/ML IV BOLUS
INTRAVENOUS | Status: DC | PRN
  Administered 2016-05-20: 130 mg via INTRAVENOUS

## 2016-05-20 MED ORDER — FENTANYL CITRATE (PF) 100 MCG/2ML IJ SOLN
INTRAMUSCULAR | Status: DC | PRN
  Administered 2016-05-20: 50 ug via INTRAVENOUS
  Administered 2016-05-20: 25 ug via INTRAVENOUS
  Administered 2016-05-20 (×2): 50 ug via INTRAVENOUS
  Administered 2016-05-20: 25 ug via INTRAVENOUS

## 2016-05-20 MED ORDER — ENOXAPARIN SODIUM 30 MG/0.3ML ~~LOC~~ SOLN
30.0000 mg | SUBCUTANEOUS | Status: DC
  Administered 2016-05-21 – 2016-05-26 (×6): 30 mg via SUBCUTANEOUS
  Filled 2016-05-20 (×6): qty 0.3

## 2016-05-20 MED ORDER — ONDANSETRON HCL 4 MG PO TABS
4.0000 mg | ORAL_TABLET | Freq: Four times a day (QID) | ORAL | Status: DC | PRN
  Administered 2016-05-24: 4 mg via ORAL
  Filled 2016-05-20: qty 1

## 2016-05-20 MED ORDER — FENTANYL CITRATE (PF) 100 MCG/2ML IJ SOLN
25.0000 ug | INTRAMUSCULAR | Status: DC | PRN
  Administered 2016-05-20 (×2): 25 ug via INTRAVENOUS

## 2016-05-20 MED ORDER — HYDRALAZINE HCL 20 MG/ML IJ SOLN
INTRAMUSCULAR | Status: DC | PRN
  Administered 2016-05-20: 5 mg via INTRAVENOUS

## 2016-05-20 MED ORDER — SODIUM CHLORIDE 0.9% FLUSH: 9.0000 mL | INTRAVENOUS | Status: DC | PRN

## 2016-05-20 MED ORDER — INSULIN ASPART 100 UNIT/ML ~~LOC~~ SOLN
0.0000 [IU] | SUBCUTANEOUS | Status: DC
  Administered 2016-05-20: 2 [IU] via SUBCUTANEOUS
  Administered 2016-05-21: 1 [IU] via SUBCUTANEOUS
  Administered 2016-05-21: 2 [IU] via SUBCUTANEOUS
  Administered 2016-05-21 – 2016-05-23 (×6): 1 [IU] via SUBCUTANEOUS
  Administered 2016-05-23 – 2016-05-25 (×6): 2 [IU] via SUBCUTANEOUS
  Administered 2016-05-26 (×2): 1 [IU] via SUBCUTANEOUS

## 2016-05-20 MED ORDER — DIPHENHYDRAMINE HCL 12.5 MG/5ML PO ELIX
12.5000 mg | ORAL_SOLUTION | Freq: Four times a day (QID) | ORAL | Status: DC | PRN
  Administered 2016-05-21: 12.5 mg via ORAL
  Filled 2016-05-20: qty 5

## 2016-05-20 MED ORDER — LIDOCAINE 2% (20 MG/ML) 5 ML SYRINGE
INTRAMUSCULAR | Status: AC
  Filled 2016-05-20: qty 15

## 2016-05-20 MED ORDER — MORPHINE SULFATE 2 MG/ML IV SOLN
INTRAVENOUS | Status: AC
  Filled 2016-05-20: qty 25

## 2016-05-20 MED ORDER — LIDOCAINE HCL (CARDIAC) 20 MG/ML IV SOLN
INTRAVENOUS | Status: DC | PRN
  Administered 2016-05-20: 40 mg via INTRAVENOUS

## 2016-05-20 MED ORDER — ROCURONIUM BROMIDE 100 MG/10ML IV SOLN
INTRAVENOUS | Status: DC | PRN
  Administered 2016-05-20: 40 mg via INTRAVENOUS

## 2016-05-20 MED ORDER — SUCCINYLCHOLINE CHLORIDE 20 MG/ML IJ SOLN
INTRAMUSCULAR | Status: DC | PRN
  Administered 2016-05-20: 110 mg via INTRAVENOUS

## 2016-05-20 MED ORDER — CALCITRIOL 0.25 MCG PO CAPS
0.2500 ug | ORAL_CAPSULE | Freq: Every day | ORAL | Status: DC
  Administered 2016-05-22 – 2016-05-26 (×5): 0.25 ug via ORAL
  Filled 2016-05-20 (×6): qty 1

## 2016-05-20 MED ORDER — DILTIAZEM HCL ER COATED BEADS 120 MG PO CP24
240.0000 mg | ORAL_CAPSULE | Freq: Every day | ORAL | Status: DC
  Administered 2016-05-22 – 2016-05-26 (×5): 240 mg via ORAL
  Filled 2016-05-20 (×5): qty 2
  Filled 2016-05-20: qty 1
  Filled 2016-05-20 (×2): qty 2

## 2016-05-20 MED ORDER — SODIUM CHLORIDE 0.9 % IV SOLN
INTRAVENOUS | Status: DC
  Administered 2016-05-20: 14:00:00 via INTRAVENOUS

## 2016-05-20 MED ORDER — MIDAZOLAM HCL 2 MG/2ML IJ SOLN
INTRAMUSCULAR | Status: AC
  Filled 2016-05-20: qty 2

## 2016-05-20 MED ORDER — DIPHENHYDRAMINE HCL 12.5 MG/5ML PO ELIX: 12.5000 mg | ORAL_SOLUTION | Freq: Four times a day (QID) | ORAL | Status: DC | PRN

## 2016-05-20 MED ORDER — PHENYLEPHRINE 40 MCG/ML (10ML) SYRINGE FOR IV PUSH (FOR BLOOD PRESSURE SUPPORT)
PREFILLED_SYRINGE | INTRAVENOUS | Status: AC
  Filled 2016-05-20: qty 10

## 2016-05-20 MED ORDER — NALOXONE HCL 0.4 MG/ML IJ SOLN: 0.4000 mg | INTRAMUSCULAR | Status: DC | PRN

## 2016-05-20 MED ORDER — BUPIVACAINE HCL (PF) 0.25 % IJ SOLN
INTRAMUSCULAR | Status: AC
  Filled 2016-05-20: qty 30

## 2016-05-20 MED ORDER — ROCURONIUM BROMIDE 10 MG/ML (PF) SYRINGE
PREFILLED_SYRINGE | INTRAVENOUS | Status: AC
  Filled 2016-05-20: qty 20

## 2016-05-20 MED ORDER — CLONIDINE HCL 0.2 MG PO TABS
0.2000 mg | ORAL_TABLET | Freq: Two times a day (BID) | ORAL | Status: DC
  Administered 2016-05-20 – 2016-05-23 (×6): 0.2 mg via ORAL
  Filled 2016-05-20 (×6): qty 1

## 2016-05-20 MED ORDER — DIPHENHYDRAMINE HCL 50 MG/ML IJ SOLN: 12.5000 mg | Freq: Four times a day (QID) | INTRAMUSCULAR | Status: DC | PRN

## 2016-05-20 MED ORDER — SUGAMMADEX SODIUM 200 MG/2ML IV SOLN
INTRAVENOUS | Status: AC
  Filled 2016-05-20: qty 4

## 2016-05-20 MED ORDER — FENTANYL CITRATE (PF) 100 MCG/2ML IJ SOLN: 25.0000 ug | INTRAMUSCULAR | Status: DC | PRN

## 2016-05-20 MED ORDER — 0.9 % SODIUM CHLORIDE (POUR BTL) OPTIME
TOPICAL | Status: DC | PRN
  Administered 2016-05-20 (×3): 1000 mL

## 2016-05-20 MED ORDER — ONDANSETRON HCL 4 MG/2ML IJ SOLN: 4.0000 mg | Freq: Once | INTRAMUSCULAR | Status: DC | PRN

## 2016-05-20 MED ORDER — ALVIMOPAN 12 MG PO CAPS
12.0000 mg | ORAL_CAPSULE | Freq: Once | ORAL | Status: AC
  Administered 2016-05-20: 12 mg via ORAL
  Filled 2016-05-20: qty 1

## 2016-05-20 MED ORDER — ALVIMOPAN 12 MG PO CAPS
12.0000 mg | ORAL_CAPSULE | Freq: Two times a day (BID) | ORAL | Status: DC
  Administered 2016-05-21 – 2016-05-22 (×3): 12 mg via ORAL
  Filled 2016-05-20 (×5): qty 1

## 2016-05-20 MED ORDER — ONDANSETRON HCL 4 MG/2ML IJ SOLN
INTRAMUSCULAR | Status: DC | PRN
  Administered 2016-05-20: 4 mg via INTRAVENOUS

## 2016-05-20 MED ORDER — ONDANSETRON HCL 4 MG/2ML IJ SOLN
4.0000 mg | Freq: Four times a day (QID) | INTRAMUSCULAR | Status: DC | PRN
Start: 2016-05-20 — End: 2016-05-26
  Administered 2016-05-21 – 2016-05-26 (×4): 4 mg via INTRAVENOUS
  Filled 2016-05-20 (×4): qty 2

## 2016-05-20 MED ORDER — DIPHENHYDRAMINE HCL 50 MG/ML IJ SOLN
12.5000 mg | Freq: Four times a day (QID) | INTRAMUSCULAR | Status: DC | PRN
  Administered 2016-05-21: 12.5 mg via INTRAVENOUS
  Filled 2016-05-20: qty 1

## 2016-05-20 MED ORDER — DEXTROSE 5 % IV SOLN
2.0000 g | INTRAVENOUS | Status: AC
  Administered 2016-05-20: 2 g via INTRAVENOUS

## 2016-05-20 MED ORDER — MIDAZOLAM HCL 5 MG/5ML IJ SOLN
INTRAMUSCULAR | Status: DC | PRN
  Administered 2016-05-20 (×2): 0.5 mg via INTRAVENOUS

## 2016-05-20 SURGICAL SUPPLY — 77 items
APPLIER CLIP ROT 10 11.4 M/L (STAPLE)
BLADE SURG 10 STRL SS (BLADE) ×3 IMPLANT
BLADE SURG ROTATE 9660 (MISCELLANEOUS) ×3 IMPLANT
CANISTER SUCTION 2500CC (MISCELLANEOUS) ×3 IMPLANT
CELLS DAT CNTRL 66122 CELL SVR (MISCELLANEOUS) ×1 IMPLANT
CHLORAPREP W/TINT 26ML (MISCELLANEOUS) ×3 IMPLANT
CLIP APPLIE ROT 10 11.4 M/L (STAPLE) IMPLANT
COVER MAYO STAND STRL (DRAPES) ×3 IMPLANT
COVER SURGICAL LIGHT HANDLE (MISCELLANEOUS) ×6 IMPLANT
DRAPE PROXIMA HALF (DRAPES) IMPLANT
DRAPE UTILITY XL STRL (DRAPES) ×3 IMPLANT
DRAPE WARM FLUID 44X44 (DRAPE) ×3 IMPLANT
DRSG OPSITE POSTOP 4X10 (GAUZE/BANDAGES/DRESSINGS) IMPLANT
DRSG OPSITE POSTOP 4X8 (GAUZE/BANDAGES/DRESSINGS) ×3 IMPLANT
ELECT BLADE 6.5 EXT (BLADE) ×3 IMPLANT
ELECT CAUTERY BLADE 6.4 (BLADE) ×6 IMPLANT
ELECT REM PT RETURN 9FT ADLT (ELECTROSURGICAL) ×3
ELECTRODE REM PT RTRN 9FT ADLT (ELECTROSURGICAL) ×1 IMPLANT
GEL ULTRASOUND 20GR AQUASONIC (MISCELLANEOUS) IMPLANT
GLOVE BIO SURGEON STRL SZ 6.5 (GLOVE) ×2 IMPLANT
GLOVE BIO SURGEON STRL SZ8 (GLOVE) ×3 IMPLANT
GLOVE BIO SURGEONS STRL SZ 6.5 (GLOVE) ×1
GLOVE BIOGEL PI IND STRL 7.0 (GLOVE) ×1 IMPLANT
GLOVE BIOGEL PI IND STRL 8 (GLOVE) ×2 IMPLANT
GLOVE BIOGEL PI IND STRL 8.5 (GLOVE) ×1 IMPLANT
GLOVE BIOGEL PI INDICATOR 7.0 (GLOVE) ×2
GLOVE BIOGEL PI INDICATOR 8 (GLOVE) ×4
GLOVE BIOGEL PI INDICATOR 8.5 (GLOVE) ×2
GLOVE ECLIPSE 7.5 STRL STRAW (GLOVE) ×6 IMPLANT
GLOVE SURG SIGNA 7.5 PF LTX (GLOVE) ×6 IMPLANT
GLOVE SURG SS PI 8.0 STRL IVOR (GLOVE) ×3 IMPLANT
GOWN STRL REUS W/ TWL LRG LVL3 (GOWN DISPOSABLE) ×1 IMPLANT
GOWN STRL REUS W/ TWL XL LVL3 (GOWN DISPOSABLE) ×5 IMPLANT
GOWN STRL REUS W/TWL LRG LVL3 (GOWN DISPOSABLE) ×2
GOWN STRL REUS W/TWL XL LVL3 (GOWN DISPOSABLE) ×10
KIT BASIN OR (CUSTOM PROCEDURE TRAY) ×3 IMPLANT
LEGGING LITHOTOMY PAIR STRL (DRAPES) IMPLANT
NS IRRIG 1000ML POUR BTL (IV SOLUTION) ×6 IMPLANT
PAD ARMBOARD 7.5X6 YLW CONV (MISCELLANEOUS) ×6 IMPLANT
PENCIL BUTTON HOLSTER BLD 10FT (ELECTRODE) ×6 IMPLANT
RELOAD PROXIMATE 75MM BLUE (ENDOMECHANICALS) ×6 IMPLANT
RTRCTR WOUND ALEXIS 18CM MED (MISCELLANEOUS) ×3
SCALPEL HARMONIC ACE (MISCELLANEOUS) ×3 IMPLANT
SCISSORS LAP 5X35 DISP (ENDOMECHANICALS) ×3 IMPLANT
SEALER TISSUE X1 CVD JAW (INSTRUMENTS) ×3 IMPLANT
SET IRRIG TUBING LAPAROSCOPIC (IRRIGATION / IRRIGATOR) IMPLANT
SLEEVE ENDOPATH XCEL 5M (ENDOMECHANICALS) ×3 IMPLANT
SPECIMEN JAR LARGE (MISCELLANEOUS) ×3 IMPLANT
STAPLER GUN LINEAR PROX 60 (STAPLE) ×3 IMPLANT
STAPLER PROXIMATE 75MM BLUE (STAPLE) ×3 IMPLANT
STAPLER VISISTAT 35W (STAPLE) ×3 IMPLANT
SUCTION POOLE TIP (SUCTIONS) ×3 IMPLANT
SURGILUBE 2OZ TUBE FLIPTOP (MISCELLANEOUS) IMPLANT
SUT PDS AB 1 TP1 54 (SUTURE) ×6 IMPLANT
SUT PDS AB 1 TP1 96 (SUTURE) ×6 IMPLANT
SUT PROLENE 2 0 CT2 30 (SUTURE) IMPLANT
SUT PROLENE 2 0 KS (SUTURE) IMPLANT
SUT SILK 2 0 SH CR/8 (SUTURE) ×3 IMPLANT
SUT SILK 2 0 TIES 10X30 (SUTURE) ×3 IMPLANT
SUT SILK 3 0 SH CR/8 (SUTURE) ×3 IMPLANT
SUT SILK 3 0 TIES 10X30 (SUTURE) ×3 IMPLANT
SYR BULB IRRIGATION 50ML (SYRINGE) ×3 IMPLANT
SYS LAPSCP GELPORT 120MM (MISCELLANEOUS)
SYSTEM LAPSCP GELPORT 120MM (MISCELLANEOUS) IMPLANT
TOWEL OR 17X26 10 PK STRL BLUE (TOWEL DISPOSABLE) ×6 IMPLANT
TRAY FOLEY CATH 16FRSI W/METER (SET/KITS/TRAYS/PACK) ×3 IMPLANT
TRAY LAPAROSCOPIC MC (CUSTOM PROCEDURE TRAY) ×3 IMPLANT
TRAY PROCTOSCOPIC FIBER OPTIC (SET/KITS/TRAYS/PACK) IMPLANT
TROCAR XCEL 12X100 BLDLESS (ENDOMECHANICALS) IMPLANT
TROCAR XCEL BLUNT TIP 100MML (ENDOMECHANICALS) ×3 IMPLANT
TROCAR XCEL NON-BLD 11X100MML (ENDOMECHANICALS) IMPLANT
TROCAR XCEL NON-BLD 5MMX100MML (ENDOMECHANICALS) ×3 IMPLANT
TUBE CONNECTING 12'X1/4 (SUCTIONS) ×2
TUBE CONNECTING 12X1/4 (SUCTIONS) ×4 IMPLANT
TUBING FILTER THERMOFLATOR (ELECTROSURGICAL) ×3 IMPLANT
UNDERPAD 30X30 (UNDERPADS AND DIAPERS) ×3 IMPLANT
YANKAUER SUCT BULB TIP NO VENT (SUCTIONS) ×6 IMPLANT

## 2016-05-20 NOTE — Interval H&P Note (Signed)
History and Physical Interval Note:no change in H and P  05/20/2016 12:27 PM  Tim Armstrong  has presented today for surgery, with the diagnosis of COLON CANCER  The various methods of treatment have been discussed with the patient and family. After consideration of risks, benefits and other options for treatment, the patient has consented to  Procedure(s): LAPAROSCOPIC ASSISTED PARTIAL COLECTOMY (N/A) as a surgical intervention .  The patient's history has been reviewed, patient examined, no change in status, stable for surgery.  I have reviewed the patient's chart and labs.  Questions were answered to the patient's satisfaction.     [name]

## 2016-05-20 NOTE — Anesthesia Procedure Notes (Signed)
Procedure Name: Intubation Date/Time: 05/20/2016 2:32 PM Performed by: Williemae Area B Pre-anesthesia Checklist: Patient identified, Emergency Drugs available, Suction available and Patient being monitored Patient Re-evaluated:Patient Re-evaluated prior to inductionOxygen Delivery Method: Circle system utilized Preoxygenation: Pre-oxygenation with 100% oxygen Intubation Type: IV induction Ventilation: Mask ventilation without difficulty Laryngoscope Size: Mac and 3 (#4 would have been better) Grade View: Grade II Tube type: Oral Tube size: 7.5 mm Number of attempts: 1 Airway Equipment and Method: Stylet Placement Confirmation: positive ETCO2,  breath sounds checked- equal and bilateral and ETT inserted through vocal cords under direct vision Secured at: 22 (cm at teeth) cm Tube secured with: Tape Dental Injury: Teeth and Oropharynx as per pre-operative assessment

## 2016-05-20 NOTE — Transfer of Care (Signed)
Immediate Anesthesia Transfer of Care Note  Patient: Tim Armstrong  Procedure(s) Performed: Procedure(s): LAPAROSCOPIC ASSISTED PARTIAL COLECTOMY (N/A)  Patient Location: PACU  Anesthesia Type:General  Level of Consciousness: awake, alert , oriented and patient cooperative  Airway & Oxygen Therapy: Patient Spontanous Breathing and Patient connected to nasal cannula oxygen  Post-op Assessment: Report given to RN, Post -op Vital signs reviewed and stable and Patient moving all extremities X 4  Post vital signs: Reviewed and stable  Last Vitals:  Vitals:   05/20/16 1136 05/20/16 1610  BP: (!) 157/81 (!) 168/68  Pulse: 75 67  Resp: 20 12  Temp: 36.9 C 37 C    Last Pain:  Vitals:   05/20/16 1610  TempSrc:   PainSc: 0-No pain         Complications: No apparent anesthesia complications

## 2016-05-20 NOTE — Anesthesia Postprocedure Evaluation (Signed)
Anesthesia Post Note  Patient: Tim Armstrong  Procedure(s) Performed: Procedure(s) (LRB): LAPAROSCOPIC ASSISTED PARTIAL COLECTOMY (N/A)  Patient location during evaluation: PACU Anesthesia Type: General Level of consciousness: awake, awake and alert and oriented Pain management: pain level controlled Vital Signs Assessment: post-procedure vital signs reviewed and stable Respiratory status: spontaneous breathing, nonlabored ventilation and respiratory function stable Cardiovascular status: blood pressure returned to baseline Anesthetic complications: no    Last Vitals:  Vitals:   05/20/16 1615 05/20/16 1650  BP: (!) 164/68   Pulse: 67   Resp: 13 15  Temp:      Last Pain:  Vitals:   05/20/16 1650  TempSrc:   PainSc: 8                  Laquan Ludden COKER

## 2016-05-20 NOTE — Op Note (Signed)
LAPAROSCOPIC ASSISTED PARTIAL COLECTOMY  Procedure Note  Tim Armstrong 05/20/2016   Pre-op Diagnosis: COLON CANCER     Post-op Diagnosis: same  Procedure(s): LAPAROSCOPIC ASSISTED PARTIAL ILEOCOLECTOMY  Surgeon(s): Coralie Keens, MD  Anesthesia: General  Staff:  Circulator: Christen Bame, RN Relief Scrub: Veronia Beets, RN Scrub Person: Adella Hare; Rushdan M Islam, RN  Estimated Blood Loss: Minimal               Specimens: right colon with appendix and terminal ileum.  Colon cancer in the hepatic flexure          Tim Armstrong   Date: 05/20/2016  Time: 4:00 PM

## 2016-05-20 NOTE — Anesthesia Preprocedure Evaluation (Addendum)
Anesthesia Evaluation  Patient identified by MRN, date of birth, ID band Patient awake    Reviewed: Allergy & Precautions, NPO status , Patient's Chart, lab work & pertinent test results  Airway Mallampati: I  TM Distance: >3 FB Neck ROM: Full    Dental  (+) Missing, Poor Dentition, Dental Advisory Given   Pulmonary former smoker,    breath sounds clear to auscultation       Cardiovascular hypertension,  Rhythm:Regular Rate:Normal   - Left ventricle: The cavity size was normal. There was mild   concentric hypertrophy. Systolic function was vigorous. The   estimated ejection fraction was in the range of 65% to 70%. Wall   motion was normal; there were no regional wall motion   abnormalities. Doppler parameters are consistent with abnormal   left ventricular relaxation (grade 1 diastolic dysfunction). - Mitral valve: Calcified annulus. There was mild regurgitation. - Left atrium: The atrium was mildly dilated. - Pulmonary arteries: Systolic pressure was mildly increased.    Neuro/Psych    GI/Hepatic   Endo/Other  diabetes  Renal/GU ESRF and DialysisRenal disease     Musculoskeletal   Abdominal   Peds  Hematology   Anesthesia Other Findings   Reproductive/Obstetrics                            Anesthesia Physical Anesthesia Plan  ASA: III  Anesthesia Plan: General   Post-op Pain Management:    Induction: Intravenous  Airway Management Planned: Oral ETT  Additional Equipment:   Intra-op Plan:   Post-operative Plan: Extubation in OR  Informed Consent: I have reviewed the patients History and Physical, chart, labs and discussed the procedure including the risks, benefits and alternatives for the proposed anesthesia with the patient or authorized representative who has indicated his/her understanding and acceptance.   Dental advisory given  Plan Discussed with:   Anesthesia  Plan Comments:         Anesthesia Quick Evaluation

## 2016-05-21 ENCOUNTER — Encounter (HOSPITAL_COMMUNITY): Payer: Self-pay | Admitting: Surgery

## 2016-05-21 LAB — BASIC METABOLIC PANEL
Anion gap: 15 (ref 5–15)
BUN: 43 mg/dL — AB (ref 6–20)
CHLORIDE: 98 mmol/L — AB (ref 101–111)
CO2: 26 mmol/L (ref 22–32)
Calcium: 8.8 mg/dL — ABNORMAL LOW (ref 8.9–10.3)
Creatinine, Ser: 7.82 mg/dL — ABNORMAL HIGH (ref 0.61–1.24)
GFR calc Af Amer: 7 mL/min — ABNORMAL LOW (ref >60)
GFR calc non Af Amer: 6 mL/min — ABNORMAL LOW (ref >60)
Glucose, Bld: 172 mg/dL — ABNORMAL HIGH (ref 65–99)
POTASSIUM: 3.4 mmol/L — AB (ref 3.5–5.1)
SODIUM: 139 mmol/L (ref 135–145)

## 2016-05-21 LAB — HEMOGLOBIN A1C
HEMOGLOBIN A1C: 9.9 % — AB (ref 4.8–5.6)
Mean Plasma Glucose: 237 mg/dL

## 2016-05-21 LAB — CBC
HEMATOCRIT: 41.4 % (ref 39.0–52.0)
Hemoglobin: 14 g/dL (ref 13.0–17.0)
MCH: 25.2 pg — ABNORMAL LOW (ref 26.0–34.0)
MCHC: 33.8 g/dL (ref 30.0–36.0)
MCV: 74.5 fL — AB (ref 78.0–100.0)
Platelets: 147 10*3/uL — ABNORMAL LOW (ref 150–400)
RBC: 5.56 MIL/uL (ref 4.22–5.81)
RDW: 15.8 % — AB (ref 11.5–15.5)
WBC: 14.2 10*3/uL — AB (ref 4.0–10.5)

## 2016-05-21 LAB — GLUCOSE, CAPILLARY
GLUCOSE-CAPILLARY: 139 mg/dL — AB (ref 65–99)
Glucose-Capillary: 109 mg/dL — ABNORMAL HIGH (ref 65–99)
Glucose-Capillary: 133 mg/dL — ABNORMAL HIGH (ref 65–99)
Glucose-Capillary: 147 mg/dL — ABNORMAL HIGH (ref 65–99)
Glucose-Capillary: 199 mg/dL — ABNORMAL HIGH (ref 65–99)

## 2016-05-21 MED ORDER — LIDOCAINE-PRILOCAINE 2.5-2.5 % EX CREA: 1.0000 "application " | TOPICAL_CREAM | CUTANEOUS | Status: DC | PRN

## 2016-05-21 MED ORDER — SODIUM CHLORIDE 0.9 % IV SOLN: 100.0000 mL | INTRAVENOUS | Status: DC | PRN

## 2016-05-21 MED ORDER — LIDOCAINE HCL (PF) 1 % IJ SOLN: 5.0000 mL | INTRAMUSCULAR | Status: DC | PRN

## 2016-05-21 MED ORDER — ALTEPLASE 2 MG IJ SOLR: 2.0000 mg | Freq: Once | INTRAMUSCULAR | Status: DC | PRN

## 2016-05-21 MED ORDER — PENTAFLUOROPROP-TETRAFLUOROETH EX AERO: 1.0000 "application " | INHALATION_SPRAY | CUTANEOUS | Status: DC | PRN

## 2016-05-21 MED ORDER — HEPARIN SODIUM (PORCINE) 1000 UNIT/ML DIALYSIS: 1000.0000 [IU] | INTRAMUSCULAR | Status: DC | PRN
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
## 2016-05-21 NOTE — Progress Notes (Signed)
Incentive Spirometry given to patient with instructions on how to use.  Patient returned demonstration.

## 2016-05-21 NOTE — Op Note (Signed)
NAMEMERVIL, MROTEK NO.:  [id number]  MEDICAL RECORD NO.:  [id number]  LOCATION:  6E12C                        FACILITY:  [facility]  PHYSICIAN:  Coralie Keens, M.D. DATE OF BIRTH:  March 20, 1946  DATE OF PROCEDURE:  05/20/2016 DATE OF DISCHARGE:                              OPERATIVE REPORT   PREOPERATIVE DIAGNOSIS:  Colon cancer.  POSTOPERATIVE DIAGNOSIS:  Colon cancer.  PROCEDURE:  Laparoscopic-assisted partial colectomy.  SURGEON:  Coralie Keens, M.D.  ANESTHESIA:  General with 0.25% Marcaine.  ESTIMATED BLOOD LOSS:  Minimal.  INDICATIONS:  This is a 70 year old gentleman with end-stage renal disease on hemodialysis.  He was found on screening colonoscopy to have a mass in the hepatic flexure.  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  Preoperative CEA is pending.  A preoperative CT scan showed no extension of disease grossly.  FINDINGS:  The patient was indeed found to have a mass in the hepatic flexure which was small.  It was marked with tattoo ink from the colonoscopy.  There was no gross intraabdominal disease identified outside of the colon.  PROCEDURE IN DETAIL:  The patient was brought to the operating room, identified as Benedict Needy.  He was placed supine on the operating table.  General anesthesia was induced.  His abdomen was then prepped and draped in usual sterile fashion.  I made a small vertical incision above the umbilicus with a scalpel.  I took this down to fascia, which was then opened with a scalpel as well.  A hemostat was used to pass into the peritoneal cavity under direct vision.  A 0 Vicryl pursestring suture was then placed around the fascial opening.  A Hasson port was placed through the opening and insufflation of the abdomen was begun.  I then placed a 5 mm port in the patient's higher midline and another in the lower midline, both under direct vision.  I was able to easily identify the  area of tattooing at the hepatic flexure from the previous colonoscopy.  I then mobilized the right colon easily along the white line of Toldt.  He had a very floppy redundant colon which was easily mobilized all the way around past the hepatic flexure.  I then took down some attachments of the distal ileum with a Harmonic Scalpel as well. At this point, the colon was fully mobilized to the midline.  I then removed all laparoscopic ports and created a small midline incision using the Hasson port site.  I took this down to the fascia and peritoneum with the electrocautery.  I then easily eviscerated the colon including the cecum, terminal ileum, and appendix.  I transected the distal ileum with a GIA-75 stapler.  I then took down the mesentery with the Harmonic Scalpel.  I then transected the colon on the mid transverse colon distal to the area of tattooing and palpable mass.  I did this also with the GIA-75 stapler.  I then took down the rest of the mesentery of the right colon with the EnSeal cautery device as well as clamps and silk ties.  Once the entire specimen was removed, it was sent to Pathology for evaluation.  I then  reapproximated the small bowel to the transverse colon in side-to-side fashion with interrupted silk sutures.  I created a colotomy and enterotomy with the cautery.  I then performed a side-to-side anastomosis with a single firing of the GIA-75 stapler.  The open end was then closed with a TA-60 stapler.  A wide anastomosis appeared to be achieved.  I then closed the mesenteric defect with interrupted 2-0 silk sutures and reinforced the staple line with interrupted 2-0 and 3-0 silk sutures as well.  I then was able to place a piece of omentum and sutured in place over the anastomosis as well.  I again evaluated the anastomosis and appeared widely patent and well perfused.  It was then placed back into the abdominal cavity.  I then irrigated the abdomen with several  liters of normal saline.  At this point, the wound protector that had been placed was removed.  We changed our gown and gloves and placed new sterile drapes.  The patient's midline fascia was then closed with a running #1 looped PDS suture.  The skin was then irrigated and anesthetized with Marcaine and then closed with skin staples.  The patient tolerated the procedure well.  All the counts were correct at the end of the procedure.  The patient was then extubated in the operating room and taken in a stable condition to recovery room.     Coralie Keens, M.D.     DB/MEDQ  D:  05/20/2016  T:  05/21/2016  Job:  [id number]

## 2016-05-21 NOTE — Progress Notes (Signed)
Foley catheter removed without any complication. Urinal placed in room and pt made aware to notify nurse when he voids. Foley had 30 ml of  bloody urine.

## 2016-05-21 NOTE — Progress Notes (Signed)
1 Day Post-Op  Subjective: Reports soreness at the incision Denies nausea this morning  Objective: Vital signs in last 24 hours: Temp:  [98 F (36.7 C)-98.8 F (37.1 C)] 98.4 F (36.9 C) (11/07 0449) Pulse Rate:  [67-98] 98 (11/07 0449) Resp:  [11-20] 16 (11/07 0449) BP: (157-202)/(68-91) 171/91 (11/07 0449) SpO2:  [99 %-100 %] 100 % (11/07 0449) FiO2 (%):  [41 %] 41 % (11/06 2136) Weight:  [61 kg (134 lb 7.7 oz)-61.5 kg (135 lb 9.3 oz)] 61.5 kg (135 lb 9.3 oz) (11/06 2050) Last BM Date: 05/20/16  Intake/Output from previous day: 11/06 0701 - 11/07 0700 In: 1773.8 [I.V.:1773.8] Out: 420 [Urine:320; Blood:100] Intake/Output this shift: No intake/output data recorded.  Exam: Awake and alert Lungs clear Abdomen soft, dressing dry  Lab Results:   Recent Labs  05/20/16 1335 05/21/16 0522  WBC 6.5 14.2*  HGB 14.8 14.0  HCT 43.1 41.4  PLT 149* 147*   BMET  Recent Labs  05/20/16 1335 05/21/16 0522  NA 135 139  K 4.4 3.4*  CL 93* 98*  CO2 26 26  GLUCOSE 201* 172*  BUN 35* 43*  CREATININE 7.31* 7.82*  CALCIUM 9.7 8.8*   PT/INR No results for input(s): LABPROT, INR in the last 72 hours. ABG No results for input(s): PHART, HCO3 in the last 72 hours.  Invalid input(s): PCO2, PO2  Studies/Results: No results found.  Anti-infectives: Anti-infectives    Start     Dose/Rate Route Frequency Ordered Stop   05/20/16 1245  cefoTEtan (CEFOTAN) 2 g in dextrose 5 % 50 mL IVPB     2 g 100 mL/hr over 30 Minutes Intravenous On call to O.R. 05/20/16 1225 05/20/16 1455   05/20/16 1241  cefoTEtan in Dextrose 5% (CEFOTAN) 2-2.08 GM-% IVPB    Comments:  Tim Tim Armstrong   : cabinet override      05/20/16 1241 05/21/16 0044      Assessment/Plan: s/p Procedure(s): LAPAROSCOPIC ASSISTED PARTIAL COLECTOMY (N/Tim Armstrong)  S/p right partial colectomy for Tim Armstrong colon cancer at the hepatic flexure  On Entereg protocol.  Will try clear liquids today Out of bed Incentive spirometer D/c  foley Nephrology to see regarding inpt dialysis while here  LOS: 1 day    Tim Tim Armstrong 05/21/2016

## 2016-05-21 NOTE — Procedures (Signed)
I was present at this dialysis session. I have reviewed the session itself and made appropriate changes.   Filed Weights   05/20/16 1252 05/20/16 2050 05/21/16 1106  Weight: 61 kg (134 lb 7.7 oz) 61.5 kg (135 lb 9.3 oz) 62 kg (136 lb 11 oz)     Recent Labs Lab 05/21/16 0522  NA 139  K 3.4*  CL 98*  CO2 26  GLUCOSE 172*  BUN 43*  CREATININE 7.82*  CALCIUM 8.8*     Recent Labs Lab 05/20/16 1335 05/21/16 0522  WBC 6.5 14.2*  HGB 14.8 14.0  HCT 43.1 41.4  MCV 74.7* 74.5*  PLT 149* 147*    Scheduled Meds: . alvimopan  12 mg Oral BID  . calcitRIOL  0.25 mcg Oral Daily  . cloNIDine  0.2 mg Oral BID  . diltiazem  240 mg Oral Daily  . enoxaparin (LOVENOX) injection  30 mg Subcutaneous Q24H  . insulin aspart  0-9 Units Subcutaneous Q4H  . labetalol  200 mg Oral BID  . morphine   Intravenous Q4H   Continuous Infusions: . sodium chloride 75 mL/hr at 05/21/16 0037   PRN Meds:.diphenhydrAMINE **OR** diphenhydrAMINE, naloxone **AND** sodium chloride flush, ondansetron **OR** ondansetron (ZOFRAN) IV   Donetta Potts,  MD 05/21/2016, 11:55 AM

## 2016-05-22 ENCOUNTER — Ambulatory Visit: Payer: Self-pay | Admitting: Vascular Surgery

## 2016-05-22 LAB — GLUCOSE, CAPILLARY
GLUCOSE-CAPILLARY: 114 mg/dL — AB (ref 65–99)
GLUCOSE-CAPILLARY: 122 mg/dL — AB (ref 65–99)
GLUCOSE-CAPILLARY: 149 mg/dL — AB (ref 65–99)
GLUCOSE-CAPILLARY: 121 mg/dL — AB (ref 65–99)
Glucose-Capillary: 148 mg/dL — ABNORMAL HIGH (ref 65–99)
Glucose-Capillary: 112 mg/dL — ABNORMAL HIGH (ref 65–99)

## 2016-05-22 LAB — MRSA PCR SCREENING: MRSA by PCR: NEGATIVE

## 2016-05-22 LAB — CEA: CEA: 7.1 ng/mL — ABNORMAL HIGH (ref 0.0–4.7)

## 2016-05-22 NOTE — Progress Notes (Signed)
Subjective:  No cos / tolerated hd yest.   Objective Vital signs in last 24 hours: Vitals:   05/22/16 0402 05/22/16 0453 05/22/16 0800 05/22/16 0957  BP: (!) 143/70   134/68  Pulse: 78   78  Resp: 16 16 18 18   Temp: 98.4 F (36.9 C)   98.1 F (36.7 C)  TempSrc: Oral   Oral  SpO2: 100% 100% 96% 100%  Weight:      Height:       Weight change: 1 kg (2 lb 3.3 oz)  Physical Exam: General:  Alert Thin AAM  Pleasant , NAD  Heart: RRR, no rub, mur or gallop Lungs: CTA  Bilat. Nonlabored breathing  Abdomen: BS pos.Midline surgical incision healed  Extremities: no pedal edema  Dialysis Access: RIJ  Perm cath / RUA AVF  Pos Bruit / thrill /developing well  Dialysis Orders: [facility]: [facility]  on TTS . EDW 58.5 kg HD Bath 2k, 2.25ca  Time 4hr Heparin nONE. Access  R IJ P. C RUA AVF (03/27/16 Dr. Oneida Alar )     Calcitriol 0.75 mcg PO /HD   Other  Op labs hgb 13.3   Ca 9.4 phos 4.9  ( NO BINDERS) PTH 772  Problem/Plan: 1. Colon CA sp Laparoscopic-assisted partial colectomy- per [facility]/ Dr. Ninfa Linden 2. ESRD -  HD TTS  Schedule  HD NO HEP.  3. Hypertension/volume  -  On Labetalol 200 mg bid / Clonidine 0.2 bid  And Diltiazem 240 mg cd  q am / fu bp trend on hd  With recent start hd may be able to taper off some / by wt at edw  4. Anemia  - HGB 14 no esa as op  With hgb >12 5. Metabolic bone disease -  PO vit d on hd / no binders as OP with phos stable / fu labs pre hd in am  6. DM type 2 - per admit    Ernest Haber, PA-C [facility] Beeper [phone] 05/22/2016,11:26 AM  LOS: 2 days   Labs: Basic Metabolic Panel:  Recent Labs Lab 05/20/16 1335 05/21/16 0522  NA 135 139  K 4.4 3.4*  CL 93* 98*  CO2 26 26  GLUCOSE 201* 172*  BUN 35* 43*  CREATININE 7.31* 7.82*  CALCIUM 9.7 8.8*    CBC:  Recent Labs Lab 05/20/16 1335 05/21/16 0522  WBC 6.5 14.2*  HGB 14.8 14.0  HCT 43.1 41.4  MCV 74.7* 74.5*  PLT 149* 147*   CBG:  Recent Labs Lab 05/21/16 1659  05/21/16 2008 05/22/16 0046 05/22/16 0412 05/22/16 0732  GLUCAP 109* 147* 148* 114* 122*    Medications: . sodium chloride 75 mL/hr at 05/22/16 0321   . alvimopan  12 mg Oral BID  . calcitRIOL  0.25 mcg Oral Daily  . cloNIDine  0.2 mg Oral BID  . diltiazem  240 mg Oral Daily  . enoxaparin (LOVENOX) injection  30 mg Subcutaneous Q24H  . insulin aspart  0-9 Units Subcutaneous Q4H  . labetalol  200 mg Oral BID  . morphine   Intravenous Q4H    I have seen and examined this patient and agree with plan and assessment in the above note with renal recommendations/intervention highlighted.  Doing well post operatively. Continue with HD qTTS while he remains an inpatient. Broadus Doris A Audiel Scheiber,MD 05/22/2016 1:30 PM

## 2016-05-22 NOTE — Progress Notes (Signed)
Inpatient Diabetes Program Recommendations  AACE/ADA: New Consensus Statement on Inpatient Glycemic Control (2015)  Target Ranges:  Prepandial:   less than 140 mg/dL      Peak postprandial:   less than 180 mg/dL (1-2 hours)      Critically ill patients:  140 - 180 mg/dL   Lab Results  Component Value Date   GLUCAP 122 (H) 05/22/2016   HGBA1C 9.9 (H) 05/20/2016    Review of Glycemic Control  Diabetes history: DM2 Outpatient Diabetes medications: None Current orders for Inpatient glycemic control: Novolog sensitive Q4H On CL diet.  Will speak with pt regarding his HgbA1C results.   Continue to follow. Thank you. Lorenda Peck, RD, LDN, CDE Inpatient Diabetes Coordinator [phone]

## 2016-05-22 NOTE — Progress Notes (Signed)
2 Days Post-Op  Subjective: Pt reports one episode of nausea with a small amount of emesis yesterday. No nausea and minimal pain this morning No flatus yet  Objective: Vital signs in last 24 hours: Temp:  [97.8 F (36.6 C)-98.4 F (36.9 C)] 98.4 F (36.9 C) (11/08 0402) Pulse Rate:  [68-88] 78 (11/08 0402) Resp:  [10-24] 16 (11/08 0453) BP: (104-179)/(65-87) 143/70 (11/08 0402) SpO2:  [100 %] 100 % (11/08 0453) Weight:  [58.2 kg (128 lb 4.8 oz)-62 kg (136 lb 11 oz)] 58.2 kg (128 lb 4.8 oz) (11/07 2037) Last BM Date:  (PTA)  Intake/Output from previous day: 11/07 0701 - 11/08 0700 In: 1140 [P.O.:240; I.V.:900] Out: 2000  Intake/Output this shift: No intake/output data recorded.  Exam: Comfortable in appearance Lungs clear Abdomen soft, quiet  Lab Results:   Recent Labs  05/20/16 1335 05/21/16 0522  WBC 6.5 14.2*  HGB 14.8 14.0  HCT 43.1 41.4  PLT 149* 147*   BMET  Recent Labs  05/20/16 1335 05/21/16 0522  NA 135 139  K 4.4 3.4*  CL 93* 98*  CO2 26 26  GLUCOSE 201* 172*  BUN 35* 43*  CREATININE 7.31* 7.82*  CALCIUM 9.7 8.8*   PT/INR No results for input(s): LABPROT, INR in the last 72 hours. ABG No results for input(s): PHART, HCO3 in the last 72 hours.  Invalid input(s): PCO2, PO2  Studies/Results: No results found.  Anti-infectives: Anti-infectives    Start     Dose/Rate Route Frequency Ordered Stop   05/20/16 1245  cefoTEtan (CEFOTAN) 2 g in dextrose 5 % 50 mL IVPB     2 g 100 mL/hr over 30 Minutes Intravenous On call to O.R. 05/20/16 1225 05/20/16 1455   05/20/16 1241  cefoTEtan in Dextrose 5% (CEFOTAN) 2-2.08 GM-% IVPB    Comments:  [name]   : cabinet override      05/20/16 1241 05/21/16 0044      Assessment/Plan: s/p Procedure(s): LAPAROSCOPIC ASSISTED PARTIAL COLECTOMY (N/A)  Keep on clear liquids today Ambulate Entereg Awaiting final pathology  LOS: 2 days    Betsi Crespi A 05/22/2016

## 2016-05-23 LAB — RENAL FUNCTION PANEL
ANION GAP: 11 (ref 5–15)
Albumin: 2.6 g/dL — ABNORMAL LOW (ref 3.5–5.0)
BUN: 48 mg/dL — ABNORMAL HIGH (ref 6–20)
CALCIUM: 8 mg/dL — AB (ref 8.9–10.3)
CHLORIDE: 103 mmol/L (ref 101–111)
CO2: 21 mmol/L — AB (ref 22–32)
Creatinine, Ser: 7.67 mg/dL — ABNORMAL HIGH (ref 0.61–1.24)
GFR calc non Af Amer: 6 mL/min — ABNORMAL LOW (ref >60)
GFR, EST AFRICAN AMERICAN: 7 mL/min — AB (ref >60)
Glucose, Bld: 81 mg/dL (ref 65–99)
Phosphorus: 4.4 mg/dL (ref 2.5–4.6)
Potassium: 3.6 mmol/L (ref 3.5–5.1)
Sodium: 135 mmol/L (ref 135–145)

## 2016-05-23 LAB — CBC
HCT: 31.1 % — ABNORMAL LOW (ref 39.0–52.0)
HEMOGLOBIN: 10.7 g/dL — AB (ref 13.0–17.0)
MCH: 25.1 pg — AB (ref 26.0–34.0)
MCHC: 34.4 g/dL (ref 30.0–36.0)
MCV: 72.8 fL — AB (ref 78.0–100.0)
Platelets: 123 10*3/uL — ABNORMAL LOW (ref 150–400)
RBC: 4.27 MIL/uL (ref 4.22–5.81)
RDW: 16.4 % — ABNORMAL HIGH (ref 11.5–15.5)
WBC: 7.5 10*3/uL (ref 4.0–10.5)

## 2016-05-23 LAB — GLUCOSE, CAPILLARY
GLUCOSE-CAPILLARY: 95 mg/dL (ref 65–99)
GLUCOSE-CAPILLARY: 83 mg/dL (ref 65–99)
Glucose-Capillary: 103 mg/dL — ABNORMAL HIGH (ref 65–99)
Glucose-Capillary: 132 mg/dL — ABNORMAL HIGH (ref 65–99)
Glucose-Capillary: 145 mg/dL — ABNORMAL HIGH (ref 65–99)
Glucose-Capillary: 81 mg/dL (ref 65–99)

## 2016-05-23 MED ORDER — CLONIDINE HCL 0.1 MG PO TABS
0.1000 mg | ORAL_TABLET | Freq: Two times a day (BID) | ORAL | Status: DC
  Administered 2016-05-23 – 2016-05-25 (×4): 0.1 mg via ORAL
  Filled 2016-05-23 (×4): qty 1

## 2016-05-23 NOTE — Progress Notes (Signed)
Wasted 2.5 cc of morphine from morphine pca. Medicine wasted in the sink and witnessed by [facility]. Annitta Fifield, Wonda Cheng, Therapist, sports

## 2016-05-23 NOTE — Progress Notes (Signed)
Subjective:  No cos eating  Liquid breakfast/ for hd today  On schedule   Objective Vital signs in last 24 hours: Vitals:   05/23/16 0419 05/23/16 0537 05/23/16 0640 05/23/16 0756  BP: (!) 126/56     Pulse: 65     Resp: 14 17 10 12   Temp: 97.7 F (36.5 C)     TempSrc: Oral     SpO2: 98% 99% 98% 99%  Weight:      Height:       Weight change:    Physical Exam: General: Alert Thin AAM Pleasant , NAD  Heart: RRR, no rub, mur or gallop Lungs: CTA Bilat. Nonlabored breathing  Abdomen: BS pos., soft  ND, Midline surgical incision healed  Extremities: no pedal edema  Dialysis Access: RIJ Perm cath / RUA AVF Pos Bruit / thrill /developing well  Dialysis Orders: Center: [facility]. EDW 58.5 kgHD Bath 2k, 2.25caTime 4hrHeparin nONE. Access R IJ P. C RUA AVF (03/27/16 Dr. Oneida Alar )  Calcitriol 0.58mcg PO /HD  Other Op labs hgb 13.3 Ca 9.4 phos 4.9 ( NO BINDERS) PTH 772  Problem/Plan: 1. Colon CA sp Laparoscopic-assisted partial colectomy- per [facility]/ Dr. Ninfa Linden 2. ESRD - HD TTS Schedule HD NO HEP.  3. Hypertension/volume -  bp controlled  With 117/57>115/54> 126/56  Recent readings On Labetalol 200 mg bid / Clonidine 0.2 bid And Diltiazem 240 mg cd q am / fu bp trend on hd /decrease clonidine 0.2 to 0.1  Bid /  by wt at edw ,no uf with hd today 4. Anemia - HGB 14 no esa as op With hgb >12 5. Metabolic bone disease - PO vit d on hd / no binders as OP with phos stable / fu labs pre hd in am  6. DM type 2 - per admit    Ernest Haber, PA-C [facility] Beeper [phone] 05/23/2016,8:57 AM  LOS: 3 days   Labs: Basic Metabolic Panel:  Recent Labs Lab 05/20/16 1335 05/21/16 0522  NA 135 139  K 4.4 3.4*  CL 93* 98*  CO2 26 26  GLUCOSE 201* 172*  BUN 35* 43*  CREATININE 7.31* 7.82*  CALCIUM 9.7 8.8*   CBC:  Recent Labs Lab 05/20/16 1335 05/21/16 0522  WBC 6.5 14.2*  HGB 14.8 14.0  HCT 43.1 41.4  MCV 74.7* 74.5*  PLT 149*  147*   CBG:  Recent Labs Lab 05/22/16 1657 05/22/16 1958 05/23/16 0004 05/23/16 0417 05/23/16 0745  GLUCAP 149* 121* 103* 132* 95    Studies/Results: No results found. Medications: . sodium chloride 50 mL (05/23/16 0755)   . alvimopan  12 mg Oral BID  . calcitRIOL  0.25 mcg Oral Daily  . cloNIDine  0.2 mg Oral BID  . diltiazem  240 mg Oral Daily  . enoxaparin (LOVENOX) injection  30 mg Subcutaneous Q24H  . insulin aspart  0-9 Units Subcutaneous Q4H  . labetalol  200 mg Oral BID  . morphine   Intravenous Q4H    I have seen and examined this patient and agree with plan and assessment in the above note with renal recommendations/intervention highlighted. Had BM and feeling better. Continue with HD on schedule.  Broadus Chibueze A Fernande Treiber,MD 05/23/2016 11:14 AM

## 2016-05-23 NOTE — Progress Notes (Signed)
3 Days Post-Op  Subjective: Had some nausea and emesis yesterday but has since had a BM. Feels well this morning  Objective: Vital signs in last 24 hours: Temp:  [97.7 F (36.5 C)-98.4 F (36.9 C)] 97.7 F (36.5 C) (11/09 0419) Pulse Rate:  [62-78] 65 (11/09 0419) Resp:  [10-21] 10 (11/09 0640) BP: (115-134)/(54-68) 126/56 (11/09 0419) SpO2:  [95 %-100 %] 98 % (11/09 0640) Last BM Date:  (PTA)  Intake/Output from previous day: 11/08 0701 - 11/09 0700 In: 1620 [P.O.:420; I.V.:1200] Out: 100 [Urine:100] Intake/Output this shift: No intake/output data recorded.  Exam: Looks comfortable Abdomen soft, minimally tender  Lab Results:   Recent Labs  05/20/16 1335 05/21/16 0522  WBC 6.5 14.2*  HGB 14.8 14.0  HCT 43.1 41.4  PLT 149* 147*   BMET  Recent Labs  05/20/16 1335 05/21/16 0522  NA 135 139  K 4.4 3.4*  CL 93* 98*  CO2 26 26  GLUCOSE 201* 172*  BUN 35* 43*  CREATININE 7.31* 7.82*  CALCIUM 9.7 8.8*   PT/INR No results for input(s): LABPROT, INR in the last 72 hours. ABG No results for input(s): PHART, HCO3 in the last 72 hours.  Invalid input(s): PCO2, PO2  Studies/Results: No results found.  Anti-infectives: Anti-infectives    Start     Dose/Rate Route Frequency Ordered Stop   05/20/16 1245  cefoTEtan (CEFOTAN) 2 g in dextrose 5 % 50 mL IVPB     2 g 100 mL/hr over 30 Minutes Intravenous On call to O.R. 05/20/16 1225 05/20/16 1455   05/20/16 1241  cefoTEtan in Dextrose 5% (CEFOTAN) 2-2.08 GM-% IVPB    Comments:  Tim Armstrong   : cabinet override      05/20/16 1241 05/21/16 0044      Assessment/Plan: s/p Procedure(s): LAPAROSCOPIC ASSISTED PARTIAL COLECTOMY (N/A)  Will try full liquid diet today Keep on PCA until tomorrow Decrease IVF  LOS: 3 days    Tim Armstrong A 05/23/2016

## 2016-05-24 LAB — GLUCOSE, CAPILLARY
GLUCOSE-CAPILLARY: 169 mg/dL — AB (ref 65–99)
GLUCOSE-CAPILLARY: 73 mg/dL (ref 65–99)
GLUCOSE-CAPILLARY: 167 mg/dL — AB (ref 65–99)
GLUCOSE-CAPILLARY: 104 mg/dL — AB (ref 65–99)
Glucose-Capillary: 115 mg/dL — ABNORMAL HIGH (ref 65–99)
Glucose-Capillary: 86 mg/dL (ref 65–99)

## 2016-05-24 MED ORDER — NEPRO/CARBSTEADY PO LIQD
237.0000 mL | Freq: Two times a day (BID) | ORAL | Status: DC
  Administered 2016-05-24 – 2016-05-26 (×4): 237 mL via ORAL

## 2016-05-24 MED ORDER — SODIUM CHLORIDE 0.9 % IV SOLN: 250.0000 mL | INTRAVENOUS | Status: DC | PRN

## 2016-05-24 MED ORDER — SODIUM CHLORIDE 0.9% FLUSH: 3.0000 mL | INTRAVENOUS | Status: DC | PRN

## 2016-05-24 MED ORDER — OXYCODONE-ACETAMINOPHEN 5-325 MG PO TABS: 1.0000 | ORAL_TABLET | Freq: Four times a day (QID) | ORAL | 0 refills | Status: DC | PRN

## 2016-05-24 MED ORDER — OXYCODONE-ACETAMINOPHEN 5-325 MG PO TABS
1.0000 | ORAL_TABLET | ORAL | Status: DC | PRN
  Administered 2016-05-24 – 2016-05-25 (×2): 2 via ORAL
  Filled 2016-05-24 (×2): qty 2

## 2016-05-24 MED ORDER — MORPHINE SULFATE (PF) 2 MG/ML IV SOLN
1.0000 mg | INTRAVENOUS | Status: DC | PRN
  Administered 2016-05-24: 2 mg via INTRAVENOUS
  Filled 2016-05-24: qty 1

## 2016-05-24 MED ORDER — SODIUM CHLORIDE 0.9% FLUSH
3.0000 mL | Freq: Two times a day (BID) | INTRAVENOUS | Status: DC
  Administered 2016-05-24 – 2016-05-26 (×5): 3 mL via INTRAVENOUS

## 2016-05-24 NOTE — Progress Notes (Signed)
  [facility] Progress Note   Subjective:  Mild abdominal pain, but no CP or dyspnea. Says he is hungry today; currently eating breakfast. Afebrile. No newq symptoms.  Objective Vitals:   05/23/16 2137 05/24/16 0008 05/24/16 0423 05/24/16 0441  BP:   (!) 164/68   Pulse:   80   Resp: 15 10 19 12   Temp:   98.5 F (36.9 C)   TempSrc:   Oral   SpO2: 100% 99% 98% 99%  Weight:      Height:       Physical Exam General: Well appearing male, NAD Heart: RRR; no murmur Lungs: CTAB Extremities: No LE edema Dialysis Access: RIJ TDC, RUE AVF + thrill/bruit (maturing)  Additional Objective Labs: Basic Metabolic Panel:  Recent Labs Lab 05/20/16 1335 05/21/16 0522 05/23/16 1749  NA 135 139 135  K 4.4 3.4* 3.6  CL 93* 98* 103  CO2 26 26 21*  GLUCOSE 201* 172* 81  BUN 35* 43* 48*  CREATININE 7.31* 7.82* 7.67*  CALCIUM 9.7 8.8* 8.0*  PHOS  --   --  4.4   Liver Function Tests:  Recent Labs Lab 05/23/16 1749  ALBUMIN 2.6*   CBC:  Recent Labs Lab 05/20/16 1335 05/21/16 0522 05/23/16 1749  WBC 6.5 14.2* 7.5  HGB 14.8 14.0 10.7*  HCT 43.1 41.4 31.1*  MCV 74.7* 74.5* 72.8*  PLT 149* 147* 123*   CBG:  Recent Labs Lab 05/23/16 1629 05/23/16 2141 05/24/16 0016 05/24/16 0418 05/24/16 0744  GLUCAP 81 83 115* 169* 73   Studies/Results: No results found. Medications:  . calcitRIOL  0.25 mcg Oral Daily  . cloNIDine  0.1 mg Oral BID  . diltiazem  240 mg Oral Daily  . enoxaparin (LOVENOX) injection  30 mg Subcutaneous Q24H  . insulin aspart  0-9 Units Subcutaneous Q4H  . labetalol  200 mg Oral BID  . sodium chloride flush  3 mL Intravenous Q12H    Dialysis Orders: [facility], EDW 58.5 kg, 2K/2.25Ca, 4hr, no heparin, R TDC (maturing RUE AVF, 03/27/16 by Dr. Oneida Alar )  - Calcitriol 0.32mcg PO q HD  Assessment/Plan: 1. Colon Cancer (s/p laparoscopic partial colectomy 11/6 by Dr. Ninfa Linden): Per surgery. 2. ESRD: Continue HD per TTS schedule,  next 11/11. No heparin with HD. K 3.6. 3. HTN/volume: BP variable. No edema. Clonidine reduced 0.2 ->0.1mg  BID 11/9; monitor. Did not reach EDW with last HD, will try for mild UF with next HD. 4. Anemia: Hgb 10.7 (drop from 14 post-op), may need to start ESA if drops lower. 5. Secondary hyperparathyroidism: Ca/Phos ok. Continue calcitriol. Not on binders, not needed for now. 6. Nutrition: Alb 2.6, back on full diet as of today. Adding Nepro supps. 7. Type 2 DM: On insulin, per primary.  Tim Penton, PA-C 05/24/2016, 9:23 AM  [facility] Pager: [phone]  I have seen and examined this patient and agree with plan and assessment in the above note with renal recommendations/intervention highlighted. Plan for discharge tomorrow after HD. Will put on first shift. Broadus Ernan A Sadiq Mccauley,MD 05/24/2016 2:28 PM

## 2016-05-24 NOTE — Care Management Important Message (Signed)
Important Message  Patient Details  Name: Tim Armstrong MRN: [id number] Date of Birth: 04/28/1946   Medicare Important Message Given:  Yes    Coreon Simkins 05/24/2016, 12:11 PM

## 2016-05-24 NOTE — Discharge Instructions (Signed)
[facility]      [facility] [phone]  OPEN ABDOMINAL SURGERY: POST OP INSTRUCTIONS  Always review your discharge instruction sheet given to you by the facility where your surgery was performed.  IF YOU HAVE DISABILITY OR FAMILY LEAVE FORMS, YOU MUST BRING THEM TO THE OFFICE FOR PROCESSING.  PLEASE DO NOT GIVE THEM TO YOUR DOCTOR.  1. A prescription for pain medication may be given to you upon discharge.  Take your pain medication as prescribed, if needed.  If narcotic pain medicine is not needed, then you may take acetaminophen (Tylenol) or ibuprofen (Advil) as needed. 2. Take your usually prescribed medications unless otherwise directed. 3. If you need a refill on your pain medication, please contact your pharmacy. They will contact our office to request authorization.  Prescriptions will not be filled after 5pm or on week-ends. 4. You should follow a light diet the first few days after arrival home, such as soup and crackers, pudding, etc.unless your doctor has advised otherwise. A high-fiber, low fat diet can be resumed as tolerated.   Be sure to include lots of fluids daily. Most patients will experience some swelling and bruising on the chest and neck area.  Ice packs will help.  Swelling and bruising can take several days to resolve 5. Most patients will experience some swelling and bruising in the area of the incision. Ice pack will help. Swelling and bruising can take several days to resolve..  6. It is common to experience some constipation if taking pain medication after surgery.  Increasing fluid intake and taking a stool softener will usually help or prevent this problem from occurring.  A mild laxative (Milk of Magnesia or Miralax) should be taken according to package directions if there are no bowel movements after 48 hours. 7.  You may have steri-strips (small skin tapes) in place directly over the incision.  These strips should be left on the skin for 7-10 days.  If your  surgeon used skin glue on the incision, you may shower in 24 hours.  The glue will flake off over the next 2-3 weeks.  Any sutures or staples will be removed at the office during your follow-up visit. You may find that a light gauze bandage over your incision may keep your staples from being rubbed or pulled. You may shower and replace the bandage daily. 8. ACTIVITIES:  You may resume regular (light) daily activities beginning the next day--such as daily self-care, walking, climbing stairs--gradually increasing activities as tolerated.  You may have sexual intercourse when it is comfortable.  Refrain from any heavy lifting or straining until approved by your doctor. a. You may drive when you no longer are taking prescription pain medication, you can comfortably wear a seatbelt, and you can safely maneuver your car and apply brakes b. Return to Work: ___________________________________ 9. You should see your doctor in the office for a follow-up appointment approximately two weeks after your surgery.  Make sure that you call for this appointment within a day or two after you arrive home to insure a convenient appointment time. OTHER INSTRUCTIONS:  _____________________________________________________________ _____________________________________________________________  WHEN TO CALL YOUR DOCTOR: 1. Fever over 101.0 2. Inability to urinate 3. Nausea and/or vomiting 4. Extreme swelling or bruising 5. Continued bleeding from incision. 6. Increased pain, redness, or drainage from the incision. 7. Difficulty swallowing or breathing 8. Muscle cramping or spasms. 9. Numbness or tingling in hands or feet or around lips.  The clinic staff is available to   answer your questions during regular business hours.  Please don't hesitate to call and ask to speak to one of the nurses if you have concerns.  For further questions, please visit [email or web address]   

## 2016-05-24 NOTE — Progress Notes (Signed)
4 Days Post-Op  Subjective: Having multiple BM's Tolerating full liquids  Objective: Vital signs in last 24 hours: Temp:  [97.8 F (36.6 C)-99 F (37.2 C)] 98.5 F (36.9 C) (11/10 0423) Pulse Rate:  [59-80] 80 (11/10 0423) Resp:  [10-19] 12 (11/10 0441) BP: (104-169)/(51-80) 164/68 (11/10 0423) SpO2:  [97 %-100 %] 99 % (11/10 0441) FiO2 (%):  [99 %-100 %] 100 % (11/09 1600) Weight:  [61 kg (134 lb 7.7 oz)-61.6 kg (135 lb 12.8 oz)] 61.6 kg (135 lb 12.8 oz) (11/09 2129) Last BM Date: 05/23/16  Intake/Output from previous day: 11/09 0701 - 11/10 0700 In: 1566.6 [P.O.:600; I.V.:966.6] Out: 0  Intake/Output this shift: Total I/O In: 577 [P.O.:120; I.V.:457] Out: 0   Exam: Abdomen soft Incision healing well  Lab Results:   Recent Labs  05/23/16 1749  WBC 7.5  HGB 10.7*  HCT 31.1*  PLT 123*   BMET  Recent Labs  05/23/16 1749  NA 135  K 3.6  CL 103  CO2 21*  GLUCOSE 81  BUN 48*  CREATININE 7.67*  CALCIUM 8.0*   PT/INR No results for input(s): LABPROT, INR in the last 72 hours. ABG No results for input(s): PHART, HCO3 in the last 72 hours.  Invalid input(s): PCO2, PO2  Studies/Results: No results found.  Anti-infectives: Anti-infectives    Start     Dose/Rate Route Frequency Ordered Stop   05/20/16 1245  cefoTEtan (CEFOTAN) 2 g in dextrose 5 % 50 mL IVPB     2 g 100 mL/hr over 30 Minutes Intravenous On call to O.R. 05/20/16 1225 05/20/16 1455   05/20/16 1241  cefoTEtan in Dextrose 5% (CEFOTAN) 2-2.08 GM-% IVPB    Comments:  [name]   : cabinet override      05/20/16 1241 05/21/16 0044      Assessment/Plan: s/p Procedure(s): LAPAROSCOPIC ASSISTED PARTIAL COLECTOMY (N/A)  Doing well  Stop PCA Soft diet Home tomorrow after dialysis if all goes well  LOS: 4 days    Joanthan Hlavacek A 05/24/2016

## 2016-05-25 LAB — RENAL FUNCTION PANEL
Albumin: 2.5 g/dL — ABNORMAL LOW (ref 3.5–5.0)
Anion gap: 13 (ref 5–15)
BUN: 22 mg/dL — AB (ref 6–20)
CALCIUM: 7.9 mg/dL — AB (ref 8.9–10.3)
CHLORIDE: 98 mmol/L — AB (ref 101–111)
CO2: 26 mmol/L (ref 22–32)
CREATININE: 5.65 mg/dL — AB (ref 0.61–1.24)
GFR, EST AFRICAN AMERICAN: 11 mL/min — AB (ref >60)
GFR, EST NON AFRICAN AMERICAN: 9 mL/min — AB (ref >60)
Glucose, Bld: 115 mg/dL — ABNORMAL HIGH (ref 65–99)
Phosphorus: 3.8 mg/dL (ref 2.5–4.6)
Potassium: 3.7 mmol/L (ref 3.5–5.1)
SODIUM: 137 mmol/L (ref 135–145)

## 2016-05-25 LAB — CBC
HCT: 31.1 % — ABNORMAL LOW (ref 39.0–52.0)
Hemoglobin: 10.4 g/dL — ABNORMAL LOW (ref 13.0–17.0)
MCH: 24.8 pg — AB (ref 26.0–34.0)
MCHC: 33.4 g/dL (ref 30.0–36.0)
MCV: 74 fL — AB (ref 78.0–100.0)
PLATELETS: 142 10*3/uL — AB (ref 150–400)
RBC: 4.2 MIL/uL — ABNORMAL LOW (ref 4.22–5.81)
RDW: 15.9 % — AB (ref 11.5–15.5)
WBC: 7 10*3/uL (ref 4.0–10.5)

## 2016-05-25 LAB — GLUCOSE, CAPILLARY
GLUCOSE-CAPILLARY: 154 mg/dL — AB (ref 65–99)
GLUCOSE-CAPILLARY: 120 mg/dL — AB (ref 65–99)
GLUCOSE-CAPILLARY: 101 mg/dL — AB (ref 65–99)
Glucose-Capillary: 161 mg/dL — ABNORMAL HIGH (ref 65–99)
Glucose-Capillary: 166 mg/dL — ABNORMAL HIGH (ref 65–99)
Glucose-Capillary: 93 mg/dL (ref 65–99)

## 2016-05-25 NOTE — Procedures (Signed)
I was present at this dialysis session. I have reviewed the session itself and made appropriate changes.   Filed Weights   05/23/16 2129 05/24/16 2039 05/25/16 0752  Weight: 61.6 kg (135 lb 12.8 oz) 61.5 kg (135 lb 9.3 oz) 58.7 kg (129 lb 6.6 oz)     Recent Labs Lab 05/25/16 0830  NA 137  K 3.7  CL 98*  CO2 26  GLUCOSE 115*  BUN 22*  CREATININE 5.65*  CALCIUM 7.9*  PHOS 3.8     Recent Labs Lab 05/21/16 0522 05/23/16 1749 05/25/16 0830  WBC 14.2* 7.5 7.0  HGB 14.0 10.7* 10.4*  HCT 41.4 31.1* 31.1*  MCV 74.5* 72.8* 74.0*  PLT 147* 123* 142*    Scheduled Meds: . calcitRIOL  0.25 mcg Oral Daily  . cloNIDine  0.1 mg Oral BID  . diltiazem  240 mg Oral Daily  . enoxaparin (LOVENOX) injection  30 mg Subcutaneous Q24H  . feeding supplement (NEPRO CARB STEADY)  237 mL Oral BID BM  . insulin aspart  0-9 Units Subcutaneous Q4H  . labetalol  200 mg Oral BID  . sodium chloride flush  3 mL Intravenous Q12H   Continuous Infusions: PRN Meds:.sodium chloride, diphenhydrAMINE **OR** diphenhydrAMINE, morphine injection, naloxone **AND** sodium chloride flush, ondansetron **OR** ondansetron (ZOFRAN) IV, oxyCODONE-acetaminophen, sodium chloride flush   Donetta Potts,  MD 05/25/2016, 9:57 AM

## 2016-05-25 NOTE — Progress Notes (Signed)
[facility] Progress Note 5 Days Post-Op  Subjective: Patient doing fine in dialysis.  Objective: Vital signs in last 24 hours: Temp:  [98.2 F (36.8 C)-98.7 F (37.1 C)] 98.7 F (37.1 C) (11/11 0752) Pulse Rate:  [56-75] 57 (11/11 1030) Resp:  [17-19] 18 (11/11 0752) BP: (115-153)/(56-82) 136/62 (11/11 1030) SpO2:  [98 %-100 %] 98 % (11/11 0453) Weight:  [58.7 kg (129 lb 6.6 oz)-61.5 kg (135 lb 9.3 oz)] 58.7 kg (129 lb 6.6 oz) (11/11 0752) Last BM Date: 05/24/16  Intake/Output from previous day: 11/10 0701 - 11/11 0700 In: 660 [P.O.:660] Out: 1 [Emesis/NG output:1] Intake/Output this shift: No intake/output data recorded.  General: No acute distress.  Had some nausea last night.  Incisions look good.  No infectiion.  Excellent bowel sounds.  Lungs: Clear  Abd: Soft, good bowel sounds  Extremities: No changes  Neuro: Intact  Lab Results:  @LABLAST2 (wbc:2,hgb:2,hct:2,plt:2) BMET ) Recent Labs  05/23/16 1749 05/25/16 0830  NA 135 137  K 3.6 3.7  CL 103 98*  CO2 21* 26  GLUCOSE 81 115*  BUN 48* 22*  CREATININE 7.67* 5.65*  CALCIUM 8.0* 7.9*   PT/INR No results for input(s): LABPROT, INR in the last 72 hours. ABG No results for input(s): PHART, HCO3 in the last 72 hours.  Invalid input(s): PCO2, PO2  Studies/Results: No results found.  Anti-infectives: Anti-infectives    Start     Dose/Rate Route Frequency Ordered Stop   05/20/16 1245  cefoTEtan (CEFOTAN) 2 g in dextrose 5 % 50 mL IVPB     2 g 100 mL/hr over 30 Minutes Intravenous On call to O.R. 05/20/16 1225 05/20/16 1455   05/20/16 1241  cefoTEtan in Dextrose 5% (CEFOTAN) 2-2.08 GM-% IVPB    Comments:  [name]   : cabinet override      05/20/16 1241 05/21/16 0044      Assessment/Plan: s/p Procedure(s): LAPAROSCOPIC ASSISTED PARTIAL COLECTOMY Plan for discharge tomorrow  LOS: 5 days   Kathryne Eriksson. Dahlia Bailiff, MD, FACS [phone] [facility] 05/25/2016

## 2016-05-26 LAB — GLUCOSE, CAPILLARY
GLUCOSE-CAPILLARY: 140 mg/dL — AB (ref 65–99)
GLUCOSE-CAPILLARY: 147 mg/dL — AB (ref 65–99)
GLUCOSE-CAPILLARY: 89 mg/dL (ref 65–99)
Glucose-Capillary: 116 mg/dL — ABNORMAL HIGH (ref 65–99)

## 2016-05-26 NOTE — Discharge Summary (Signed)
Physician Discharge Summary  Patient ID: Tim Armstrong MRN: [id number] DOB/AGE: 03/29/1946 70 y.o.  Admit date: 05/20/2016 Discharge date: 05/26/2016  Admission Diagnoses: colon cancer  Discharge Diagnoses:  Active Problems:   Colon cancer [facility])   Discharged Condition: good  Hospital Course: He was admitted following laparoscopic assisted right colectomy. Over the following days his bowel function returned and he was able to tolerate a diet. His pain was controlled with oral medications. He underwent dialysis the day before discharge.   Consults: nephrology  Significant Diagnostic Studies: n/a  Treatments: surgery: as above  Discharge Exam: Blood pressure (!) 135/53, pulse 75, temperature 99 F (37.2 C), temperature source Oral, resp. rate 17, height 5\' 11"  (1.803 m), weight 58.7 kg (129 lb 6.6 oz), SpO2 98 %. General appearance: alert and cooperative Resp: clear to auscultation bilaterally GI: soft, non-tender; bowel sounds normal; no masses,  no organomegaly Skin: Skin color, texture, turgor normal. No rashes or lesions Incision/Wound: clean, dry and intact with staples  Disposition: 06-Home-Health Care Svc     Medication List    TAKE these medications   calcitRIOL 0.25 MCG capsule Commonly known as:  ROCALTROL Take 1 capsule (0.25 mcg total) by mouth daily.   cloNIDine 0.2 MG tablet Commonly known as:  CATAPRES Take 0.2 mg by mouth 2 (two) times daily. At home pt. Reports he takes it at 1800 & at2400   diltiazem 240 MG 24 hr capsule Commonly known as:  TIAZAC Take 240 mg by mouth daily.   labetalol 200 MG tablet Commonly known as:  NORMODYNE Take 200 mg by mouth 2 (two) times daily.   oxyCODONE-acetaminophen 5-325 MG tablet Commonly known as:  PERCOCET/ROXICET Take 1-2 tablets by mouth every 6 (six) hours as needed for moderate pain.   polyethylene glycol packet Commonly known as:  MIRALAX / GLYCOLAX Take 17 g by mouth daily as needed for mild  constipation.   tamsulosin 0.4 MG Caps capsule Commonly known as:  FLOMAX Take 1 capsule (0.4 mg total) by mouth daily after breakfast.      Follow-up Information    BLACKMAN,DOUGLAS A, MD. Schedule an appointment as soon as possible for a visit on 05/31/2016.   Specialty:  General Surgery Why:  staple removal Contact information: [address] [place] [address] [phone]           Signed: Clovis Riley 05/26/2016, 2:57 PM

## 2016-05-26 NOTE — Progress Notes (Signed)
[facility] Progress Note 6 Days Post-Op  Subjective: C/o nausea last night and this morning, partially relieved by meds. No BM yet today, did not eat breakfast.  Objective: Vital signs in last 24 hours: Temp:  [98.3 F (36.8 C)-99.3 F (37.4 C)] 99 F (37.2 C) (11/12 0930) Pulse Rate:  [66-75] 75 (11/12 0930) Resp:  [16-17] 17 (11/12 0930) BP: (130-146)/(53-69) 135/53 (11/12 0930) SpO2:  [97 %-100 %] 98 % (11/12 0930) Last BM Date: 05/24/16  Intake/Output from previous day: 11/11 0701 - 11/12 0700 In: 660 [P.O.:660] Out: 0  Intake/Output this shift: Total I/O In: 240 [P.O.:240] Out: 0   General: No acute distress. .  Incisions look good.  No infectiion.  Excellent bowel sounds.  Lungs: Clear  Abd: Soft mildly distended, good bowel sounds  Extremities: No changes  Neuro: Intact  Lab Results:  @LABLAST2 (wbc:2,hgb:2,hct:2,plt:2) BMET )  Recent Labs  05/23/16 1749 05/25/16 0830  NA 135 137  K 3.6 3.7  CL 103 98*  CO2 21* 26  GLUCOSE 81 115*  BUN 48* 22*  CREATININE 7.67* 5.65*  CALCIUM 8.0* 7.9*   PT/INR No results for input(s): LABPROT, INR in the last 72 hours. ABG No results for input(s): PHART, HCO3 in the last 72 hours.  Invalid input(s): PCO2, PO2  Studies/Results: No results found.  Anti-infectives: Anti-infectives    Start     Dose/Rate Route Frequency Ordered Stop   05/20/16 1245  cefoTEtan (CEFOTAN) 2 g in dextrose 5 % 50 mL IVPB     2 g 100 mL/hr over 30 Minutes Intravenous On call to O.R. 05/20/16 1225 05/20/16 1455   05/20/16 1241  cefoTEtan in Dextrose 5% (CEFOTAN) 2-2.08 GM-% IVPB    Comments:  [name]   : cabinet override      05/20/16 1241 05/21/16 0044      Assessment/Plan: s/p Procedure(s): LAPAROSCOPIC ASSISTED PARTIAL COLECTOMY Will see how the day goes. If nausea passes and he is able to eat possible DC in afternoon. He is otherwise looking well.   LOS: 6 days   [facility] 05/26/2016

## 2016-05-26 NOTE — Progress Notes (Signed)
  [facility] Progress Note   Subjective:  Some nausea last night and this AM, but otherwise feels ok. No CP or dyspnea. No abdominal pain. He is eager for discharge.  Objective Vitals:   05/25/16 1700 05/25/16 2127 05/26/16 0512 05/26/16 0930  BP: (!) 146/66 (!) 132/59 (!) 130/53 (!) 135/53  Pulse: 73 73 71 75  Resp: 17 16 16 17   Temp: 98.7 F (37.1 C) 99.3 F (37.4 C) 99.1 F (37.3 C) 99 F (37.2 C)  TempSrc: Oral Oral Oral Oral  SpO2: 99% 99% 97% 98%  Weight:      Height:       Physical Exam General: Well appearing male, NAD Heart: RRR; no murmur Lungs: CTAB Abdomen: Staples intact; no tenderness, drainage, or erythema. Extremities: No LE edema Dialysis Access: RIJ TDC, RUE AVF + thrill/bruit (maturing)  Additional Objective Labs: Basic Metabolic Panel:  Recent Labs Lab 05/21/16 0522 05/23/16 1749 05/25/16 0830  NA 139 135 137  K 3.4* 3.6 3.7  CL 98* 103 98*  CO2 26 21* 26  GLUCOSE 172* 81 115*  BUN 43* 48* 22*  CREATININE 7.82* 7.67* 5.65*  CALCIUM 8.8* 8.0* 7.9*  PHOS  --  4.4 3.8   Liver Function Tests:  Recent Labs Lab 05/23/16 1749 05/25/16 0830  ALBUMIN 2.6* 2.5*   CBC:  Recent Labs Lab 05/20/16 1335 05/21/16 0522 05/23/16 1749 05/25/16 0830  WBC 6.5 14.2* 7.5 7.0  HGB 14.8 14.0 10.7* 10.4*  HCT 43.1 41.4 31.1* 31.1*  MCV 74.7* 74.5* 72.8* 74.0*  PLT 149* 147* 123* 142*   Medications:  . calcitRIOL  0.25 mcg Oral Daily  . cloNIDine  0.1 mg Oral BID  . diltiazem  240 mg Oral Daily  . enoxaparin (LOVENOX) injection  30 mg Subcutaneous Q24H  . feeding supplement (NEPRO CARB STEADY)  237 mL Oral BID BM  . insulin aspart  0-9 Units Subcutaneous Q4H  . labetalol  200 mg Oral BID  . sodium chloride flush  3 mL Intravenous Q12H    Dialysis Orders: [facility], EDW 58.5 kg, 2K/2.25Ca, 4hr, no heparin, R TDC (maturing RUE AVF, 03/27/16 by Dr. Oneida Alar )  - Calcitriol 0.12mcg PO q HD  Assessment/Plan: 1. Colon  Cancer (s/p laparoscopic partial colectomy 11/6 by Dr. Ninfa Linden): Per surgery. 2. ESRD: Continue HD per TTS schedule, next 11/14. No heparin with HD.  3. HTN/volume: BP better, no edema. Clonidine reduced 0.2 ->0.1mg  BID 11/9; monitor. Prior EDW still seems right. 4. Anemia: Hgb 10.4 (drop from 14 post-op), may need to start ESA if drops lower. 5. Secondary hyperparathyroidism: Ca/Phos ok. Continue calcitriol. Not on binders, not needed for now. 6. Nutrition: Alb 2.5, now back on full diet. Continue supps. 7. Type 2 DM: On insulin, per primary. 8. Dispo: Likely today.  Veneta Penton, PA-C 05/26/2016, 9:59 AM  [facility] Pager: [phone]  I have seen and examined this patient and agree with plan and assessment in the above note with renal recommendations/intervention highlighted. Had nausea this am and didn't eat breakfast.  Await how he does with lunch prior to decisions on discharge.  Cont with HD qTTS while he remains an inpatient. Broadus Spurgeon A Staley Budzinski,MD 05/26/2016 11:01 AM

## 2016-05-28 ENCOUNTER — Other Ambulatory Visit: Payer: Self-pay

## 2016-05-28 ENCOUNTER — Telehealth: Payer: Self-pay | Admitting: Vascular Surgery

## 2016-05-28 ENCOUNTER — Encounter: Payer: Self-pay | Admitting: *Deleted

## 2016-05-28 DIAGNOSIS — N186 End stage renal disease: Secondary | ICD-10-CM

## 2016-05-28 DIAGNOSIS — Z48812 Encounter for surgical aftercare following surgery on the circulatory system: Secondary | ICD-10-CM

## 2016-05-28 NOTE — Telephone Encounter (Signed)
-----   Message from Denman George, RN sent at 05/28/2016 10:35 AM EST ----- Regarding: needs f/u appt. and access duplex S/p Right Brachial Cephalic AV fistula [id number] ; needs f/u right AVF duplex and appt. with Dr. Oneida Alar.  The pt. recently cancelled his f/u appt., since he had been scheduled for another surgery on 11/6.

## 2016-05-28 NOTE — Telephone Encounter (Signed)
Sched lab 06/17/16 at 4:00 and MD 06/20/16 at 2:00. Lm on hm# to inform pt of appt.

## 2016-05-29 ENCOUNTER — Telehealth: Payer: Self-pay | Admitting: *Deleted

## 2016-05-29 ENCOUNTER — Encounter: Payer: Self-pay | Admitting: Hematology

## 2016-05-29 NOTE — Progress Notes (Signed)
Oncology Nurse Navigator Documentation  Oncology Nurse Navigator Flowsheets 05/28/2016  Navigator Location [facility]  Referral date to RadOnc/MedOnc 05/28/2016  Navigator Encounter Type Initial MedOnc  Abnormal Finding Date 04/20/2016  Confirmed Diagnosis Date 05/16/2016  Patient Visit Type MedOnc;Initial  Barriers/Navigation Needs Coordination of Care--informed patient and his mother that we will obtain a form for his cancer policy and mail it to his home for [facility] MD to complete. Informed his mother to call his HR department at work to get the forms needed for his long term disability that he will need. He reports his STD ends in 2 weeks.  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

## 2016-05-29 NOTE — Telephone Encounter (Signed)
Telephone call to patient to confirm appointment with Dr. Burr Medico. Pt states he will be here for the appointment. His wife may call to confirm also.

## 2016-06-12 ENCOUNTER — Telehealth: Payer: Self-pay | Admitting: *Deleted

## 2016-06-12 NOTE — Telephone Encounter (Signed)
Late Entry: Spoke to patient on 11/28 to reschedule appt with Dr. Benay Spice due to dialysis. Pt states 12/4 would be better however asked to have me call his wife to confirm on 11/29. Appointment scheduled.  11/29- Telephone call to wife and message left with new appointment time and asked for a return call if this date and time would need rescheduling.  Will attempt to reach out to wife again later today.

## 2016-06-13 ENCOUNTER — Ambulatory Visit: Payer: Medicare HMO | Admitting: Hematology

## 2016-06-17 ENCOUNTER — Encounter (HOSPITAL_COMMUNITY): Payer: Medicare HMO

## 2016-06-17 ENCOUNTER — Ambulatory Visit (HOSPITAL_BASED_OUTPATIENT_CLINIC_OR_DEPARTMENT_OTHER): Payer: Self-pay | Admitting: Oncology

## 2016-06-17 VITALS — BP 120/68 | HR 78 | Temp 98.5°F | Resp 16 | Ht 71.0 in | Wt 132.6 lb

## 2016-06-17 DIAGNOSIS — C189 Malignant neoplasm of colon, unspecified: Secondary | ICD-10-CM

## 2016-06-17 NOTE — Progress Notes (Signed)
Patient did not stay for office visit with Dr. Benay Spice.  He stated that his transportation is coming at 2:30PM and he cannot stay any longer. Patient left at 2:25PM.   Per patient's wife, OK to reschedule appt.  Message sent to [facility] to reschedule appt.

## 2016-06-18 ENCOUNTER — Telehealth: Payer: Self-pay | Admitting: Oncology

## 2016-06-18 NOTE — Telephone Encounter (Signed)
Lft vm to reschedule appt °

## 2016-06-19 ENCOUNTER — Telehealth: Payer: Self-pay | Admitting: Oncology

## 2016-06-19 NOTE — Telephone Encounter (Signed)
Lft vm on the wife's phone to reschedule appt.

## 2016-06-20 ENCOUNTER — Encounter: Payer: Medicare HMO | Admitting: Vascular Surgery

## 2016-06-24 ENCOUNTER — Encounter: Payer: Self-pay | Admitting: Oncology

## 2016-06-24 ENCOUNTER — Telehealth: Payer: Self-pay | Admitting: Oncology

## 2016-06-24 NOTE — Telephone Encounter (Signed)
Pt spouse cld to schedule appt. Per [name] ok to schedule in January. Appt has been scheduled w/Sher[facility] 07/29/16 at 2pm.

## 2016-06-25 ENCOUNTER — Encounter: Payer: Self-pay | Admitting: Vascular Surgery

## 2016-06-28 ENCOUNTER — Ambulatory Visit (HOSPITAL_COMMUNITY)
Admission: RE | Admit: 2016-06-28 | Discharge: 2016-06-28 | Disposition: A | Discharge status: Home / Self Care | Payer: Medicare HMO | Source: Ambulatory Visit | Attending: Vascular Surgery | Admitting: Vascular Surgery

## 2016-06-28 DIAGNOSIS — Z48812 Encounter for surgical aftercare following surgery on the circulatory system: Secondary | ICD-10-CM

## 2016-06-28 DIAGNOSIS — N186 End stage renal disease: Secondary | ICD-10-CM | POA: Insufficient documentation

## 2016-07-01 ENCOUNTER — Ambulatory Visit (INDEPENDENT_AMBULATORY_CARE_PROVIDER_SITE_OTHER): Payer: Medicare HMO | Admitting: Vascular Surgery

## 2016-07-01 ENCOUNTER — Encounter: Payer: Self-pay | Admitting: Vascular Surgery

## 2016-07-01 VITALS — BP 148/73 | HR 76 | Temp 97.0°F | Resp 18 | Ht 71.0 in | Wt 129.3 lb

## 2016-07-01 DIAGNOSIS — N186 End stage renal disease: Secondary | ICD-10-CM | POA: Diagnosis not present

## 2016-07-01 DIAGNOSIS — Z992 Dependence on renal dialysis: Secondary | ICD-10-CM

## 2016-07-01 NOTE — Progress Notes (Signed)
Patient is a 70 year old male who is status post right brachycephalic AV fistula on [id number]. He presents today for further follow-up. He currently dialyzes Tuesday Thursday Saturday and is using a right-sided catheter. He reports no numbness or tingling in his right hand.  Physical exam:  Vitals:   07/01/16 1309  BP: (!) 148/73  Pulse: 76  Resp: 18  Temp: 97 F (36.1 C)  TempSrc: Oral  SpO2: 98%  Weight: 129 lb 4.8 oz (58.7 kg)  Height: 5\' 11"  (1.803 m)   Right upper extremity: 2+ radial brachial pulse palpable thrill audible bruit fistula is visible from across the room  Data: Patient had a duplex ultrasound of his AV fistula today which showed no narrowing in the fistula diameter is 6 mm less than 6 mm from the skin and most areas.  Assessment: Mature right arm AV fistula.  Plan: Start cannulating fistula tomorrow. The patient will follow-up with [place] on as-needed basis.  Ruta Hinds, MD [facility] Office: [phone] Pager: [phone]

## 2016-07-22 ENCOUNTER — Telehealth: Payer: Self-pay | Admitting: *Deleted

## 2016-07-22 NOTE — Telephone Encounter (Signed)
Due to canellation, called patient to offer to see Dr. Benay Spice today at 2 pm instead of on 07/29/16. He prefers to wait until 07/29/16.

## 2016-07-24 ENCOUNTER — Telehealth: Payer: Self-pay | Admitting: *Deleted

## 2016-07-24 NOTE — Telephone Encounter (Signed)
Return call placed to patient's wife and patient's wife notified that appt with Dr. Benay Spice will last approximately 1-1 1/2 hrs and to set up pick up time for [facility] at 4:00PM.  Patient's wife appreciative of call back and has no further questions at this time.

## 2016-07-24 NOTE — Telephone Encounter (Signed)
Message received from patient's wife inquiring how long pt.'s appt will be on 07/29/16 with Dr. Benay Spice so she can set up [facility] transportation.

## 2016-07-29 ENCOUNTER — Encounter: Payer: Self-pay | Admitting: *Deleted

## 2016-07-29 ENCOUNTER — Telehealth: Payer: Self-pay | Admitting: Oncology

## 2016-07-29 ENCOUNTER — Ambulatory Visit (HOSPITAL_BASED_OUTPATIENT_CLINIC_OR_DEPARTMENT_OTHER): Payer: Medicare HMO | Admitting: Oncology

## 2016-07-29 ENCOUNTER — Ambulatory Visit (HOSPITAL_BASED_OUTPATIENT_CLINIC_OR_DEPARTMENT_OTHER): Payer: Medicare HMO

## 2016-07-29 VITALS — BP 122/62 | HR 68 | Temp 97.6°F | Resp 16 | Ht 71.0 in | Wt 138.8 lb

## 2016-07-29 DIAGNOSIS — N185 Chronic kidney disease, stage 5: Secondary | ICD-10-CM | POA: Diagnosis not present

## 2016-07-29 DIAGNOSIS — C189 Malignant neoplasm of colon, unspecified: Secondary | ICD-10-CM

## 2016-07-29 DIAGNOSIS — C183 Malignant neoplasm of hepatic flexure: Secondary | ICD-10-CM | POA: Diagnosis not present

## 2016-07-29 DIAGNOSIS — Z992 Dependence on renal dialysis: Secondary | ICD-10-CM | POA: Diagnosis not present

## 2016-07-29 LAB — CBC WITH DIFFERENTIAL/PLATELET
BASO%: 1.3 % (ref 0.0–2.0)
BASOS ABS: 0.1 10*3/uL (ref 0.0–0.1)
EOS ABS: 0.2 10*3/uL (ref 0.0–0.5)
EOS%: 5.6 % (ref 0.0–7.0)
HEMATOCRIT: 31.6 % — AB (ref 38.4–49.9)
HGB: 10.3 g/dL — ABNORMAL LOW (ref 13.0–17.1)
LYMPH#: 1 10*3/uL (ref 0.9–3.3)
LYMPH%: 23.8 % (ref 14.0–49.0)
MCH: 25.6 pg — ABNORMAL LOW (ref 27.2–33.4)
MCHC: 32.6 g/dL (ref 32.0–36.0)
MCV: 78.6 fL — AB (ref 79.3–98.0)
MONO#: 0.5 10*3/uL (ref 0.1–0.9)
MONO%: 11.2 % (ref 0.0–14.0)
NEUT#: 2.5 10*3/uL (ref 1.5–6.5)
NEUT%: 58.1 % (ref 39.0–75.0)
PLATELETS: 171 10*3/uL (ref 140–400)
RBC: 4.02 10*6/uL — AB (ref 4.20–5.82)
RDW: 21.4 % — ABNORMAL HIGH (ref 11.0–14.6)
WBC: 4.4 10*3/uL (ref 4.0–10.3)

## 2016-07-29 NOTE — Telephone Encounter (Signed)
Gave patient avs report and appointments for March and July

## 2016-07-29 NOTE — Patient Instructions (Signed)
Care Plan Summary- 07/29/2016 Name: Tim [facility]      DOB:  1945/10/24 Your Medical Team:  Medical Oncologist:  Dr. Ma Rings Radiation Oncologist:  Surgeon:   Dr. Coralie Keens Type of Cancer: Adenocarcinoma of Colon  Stage/Grade: Stage II *Exact staging of your cancer is based on size of the tumor, depth of invasion, involvement of lymph nodes or not, and whether or not the cancer has spread beyond the primary site   Recommendations: Based on information available as of today's consult. Recommendations may change depending on the results of further tests or exams. 1) Genetics counselor appointment 2) Colonoscopy in 1 year 3) Ensure your siblings and children have had colonoscopy by age 24  Next Steps: 1) Baseline CEA/CBC today 2)  Schedule in genetics 3)  Return to Dr. Benay Spice in  6 months with CEA lab

## 2016-07-29 NOTE — Patient Instructions (Signed)
Care Plan Summary- 07/29/2016 Name: Tim Armstrong      DOB:  1945/10/19 Your Medical Team:  Medical Oncologist:  Dr. Ma Rings Radiation Oncologist:  Surgeon:   Dr. Coralie Keens Type of Cancer: Adenocarcinoma of Colon  Stage/Grade: Stage II *Exact staging of your cancer is based on size of the tumor, depth of invasion, involvement of lymph nodes or not, and whether or not the cancer has spread beyond the primary site   Recommendations: Based on information available as of today's consult. Recommendations may change depending on the results of further tests or exams. 1) Genetics counselor appointment 2) Colonoscopy in 1 year 3) Ensure your siblings and children have had colonoscopy by age 68  Next Steps: 1) Baseline CEA/CBC today 2)  Schedule in genetics 3)  Return to Dr. Benay Spice in  6 months with CEA lab    Questions? [name], RN, BSN at [phone]. [name] is your Oncology Nurse Navigator and is available to assist you while you're receiving your medical care at [facility]

## 2016-07-29 NOTE — Progress Notes (Signed)
Oncology Nurse Navigator Documentation  Oncology Nurse Navigator Flowsheets 07/29/2016  Navigator Location [facility]  Referral date to RadOnc/MedOnc 05/28/2016  Navigator Encounter Type Initial MedOnc  Abnormal Finding Date 04/01/2016  Confirmed Diagnosis Date 04/01/2016  Surgery Date 05/20/2016  Patient Visit Type MedOnc;Initial  Treatment Phase Survivorship  Barriers/Navigation Needs Education  Education Newly Diagnosed Cancer Education  Interventions Education;Referrals  Referrals Genetics--per MD request  Education Method Verbal;Written;Teach-back  Support Groups/Services GI Support Group;[facility];Other--[facility]  Acuity Level 1  Time Spent with Patient 57  Met with patient and wife, Tim Armstrong during new patient visit. Explained the role of the GI Nurse Navigator and provided New Patient Packet with information on: 1. Colon* cancer--anatomy of colon, CEA level 2. Support groups 3. Advanced Directives 4. Fall Safety Plan Answered questions, reviewed current treatment plan using TEACH back and provided emotional support. Provided copy of current treatment plan. Tim [facility] understands need for genetics appointment and for 1 year colonoscopy. He will be telling his siblings/children to get colonoscopy by age 62 if not already done. His son had colon cancer in his 63's and his father had prostate cancer. Patient path shows loss of PMS2. He is independent inADLs and feeling well. Goes to dialysis in [place] every T-TH-Sat at [facility]. Has his fistula now and will be removing his external dialysis cath soon. Started dialysis 03/2016 and feels much better since this was started. Will return to Dr. Benay Armstrong in  6 months w/labs.  Tim Elks, RN, BSN GI Oncology [place]
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
## 2016-07-30 ENCOUNTER — Telehealth: Payer: Self-pay | Admitting: *Deleted

## 2016-07-30 LAB — CEA (IN HOUSE-CHCC): CEA (CHCC-In House): 2.25 ng/mL (ref 0.00–5.00)

## 2016-07-30 NOTE — Telephone Encounter (Signed)
-----   Message from Ladell Pier, MD sent at 07/30/2016 11:21 AM EST ----- Please call patient, cea is normal, f/u as scheduled

## 2016-07-30 NOTE — Telephone Encounter (Signed)
Left message on voicemail informing pt of normal lab result.

## 2016-08-28 ENCOUNTER — Ambulatory Visit: Payer: Medicare Other | Admitting: Podiatry

## 2016-09-04 ENCOUNTER — Ambulatory Visit (INDEPENDENT_AMBULATORY_CARE_PROVIDER_SITE_OTHER): Payer: Medicare HMO | Admitting: Podiatry

## 2016-09-04 ENCOUNTER — Encounter: Payer: Self-pay | Admitting: Podiatry

## 2016-09-04 VITALS — BP 162/66 | HR 78 | Resp 18

## 2016-09-04 DIAGNOSIS — R0989 Other specified symptoms and signs involving the circulatory and respiratory systems: Secondary | ICD-10-CM | POA: Diagnosis not present

## 2016-09-04 DIAGNOSIS — B351 Tinea unguium: Secondary | ICD-10-CM

## 2016-09-04 DIAGNOSIS — I739 Peripheral vascular disease, unspecified: Secondary | ICD-10-CM

## 2016-09-04 NOTE — Patient Instructions (Signed)

## 2016-09-04 NOTE — Progress Notes (Signed)
   Subjective:    Patient ID: Tim Armstrong, male    DOB: 12-Sep-1945, 71 y.o.   MRN: [id number]  HPI     's patient presents today at the recommendation of the dialysis center advised him to present for debridement of toenails. 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 The toenails of grams of become thicker and longer over time and patient was last evaluated in office on 04/25/2014 Patient denies history of foot ulceration, claudication or amputation Patient is a former smoker. The visit of 04/25/2014 patient was referred to vascular lab, however, patient canceled appointment   Review of Systems  All other systems reviewed and are negative.      Objective:   Physical Exam  DP pulses 2/4 bilaterally PT 0/4 bilaterally Capillary reflex immediate bilaterally  Neurological: Ankle reflex equal and reactive bilaterally Vibratory sensation intact bilaterally Sensation to 10 g monofilament wire intact 4/5 bilaterally  Dermatological: No open skin lesions bilaterally The toenails are elongated, hypertrophic, discolored 6-10 Absent fifth toenails bilaterally Surgical scar fifth right MPJ  Musculoskeletal: Hammertoe deformities 3 and 4 bilaterally HAV left No restriction ankle, subtalar, midtarsal joints bilaterally Daniel motor testing 5/5 bilaterally        Assessment & Plan:   Assessment: Diabetic with peripheral arterial disease  Mycotic toenails 6-10  Plan: Debridement of toenails 6-10 mechanically and electronically without any bleeding  Reappoint 3 months

## 2016-10-08 ENCOUNTER — Telehealth: Payer: Self-pay | Admitting: Oncology

## 2016-10-08 NOTE — Telephone Encounter (Signed)
Pt wife called to r/s genetics appt to next available 5/7 at 1 pm

## 2016-10-09 ENCOUNTER — Other Ambulatory Visit: Payer: Medicare HMO

## 2016-10-09 ENCOUNTER — Encounter: Payer: Medicare HMO | Admitting: Genetic Counselor

## 2016-11-18 ENCOUNTER — Encounter: Payer: Self-pay | Admitting: Genetic Counselor

## 2016-11-18 ENCOUNTER — Ambulatory Visit (HOSPITAL_BASED_OUTPATIENT_CLINIC_OR_DEPARTMENT_OTHER): Payer: Medicare HMO | Admitting: Genetic Counselor

## 2016-11-18 ENCOUNTER — Other Ambulatory Visit: Payer: Medicare HMO

## 2016-11-18 DIAGNOSIS — Z800 Family history of malignant neoplasm of digestive organs: Secondary | ICD-10-CM | POA: Diagnosis not present

## 2016-11-18 DIAGNOSIS — C183 Malignant neoplasm of hepatic flexure: Secondary | ICD-10-CM

## 2016-11-18 DIAGNOSIS — Z803 Family history of malignant neoplasm of breast: Secondary | ICD-10-CM | POA: Diagnosis not present

## 2016-11-18 DIAGNOSIS — Z8042 Family history of malignant neoplasm of prostate: Secondary | ICD-10-CM | POA: Diagnosis not present

## 2016-11-18 DIAGNOSIS — Z7183 Encounter for nonprocreative genetic counseling: Secondary | ICD-10-CM
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
## 2016-11-27 ENCOUNTER — Telehealth: Payer: Self-pay | Admitting: Genetic Counselor

## 2016-11-27 NOTE — Telephone Encounter (Signed)
Spoke with the lab. They are out of network for his insurance.  They contacted patient and he was going to think about testing.  I LM on VM to please call me back.  We can send to another lab that is in network with his insurance.

## 2016-11-28 ENCOUNTER — Telehealth: Payer: Self-pay | Admitting: Genetic Counselor

## 2016-11-28 NOTE — Telephone Encounter (Signed)
LM on VM to discuss testing that was drawn a week or so ago.  Asked that he CB.

## 2016-12-04 ENCOUNTER — Ambulatory Visit (INDEPENDENT_AMBULATORY_CARE_PROVIDER_SITE_OTHER): Payer: Medicare HMO | Admitting: Podiatry

## 2016-12-04 ENCOUNTER — Telehealth: Payer: Self-pay | Admitting: Genetic Counselor

## 2016-12-04 NOTE — Progress Notes (Signed)
NO SHOW

## 2016-12-04 NOTE — Telephone Encounter (Signed)
Spoke with wife that we could send his testing to another laboratory that was within network so that he would not have an OOP cost. His wife thought that he would probably want to redo testing, but that I should call back tomorrow.

## 2017-01-05 ENCOUNTER — Telehealth: Payer: Self-pay | Admitting: Oncology

## 2017-01-05 NOTE — Telephone Encounter (Signed)
Lvm advising appt chgd from 7/16 due to md pal to 8/7 @ 3pm.

## 2017-01-27 ENCOUNTER — Ambulatory Visit: Payer: Medicare HMO | Admitting: Oncology

## 2017-01-27 ENCOUNTER — Other Ambulatory Visit: Payer: Medicare HMO

## 2017-02-17 ENCOUNTER — Telehealth: Payer: Self-pay | Admitting: Oncology

## 2017-02-17 NOTE — Telephone Encounter (Signed)
Sent out reminder letter for upcoming appointments.

## 2017-02-17 NOTE — Telephone Encounter (Signed)
Left patient voicemail regarding his upcoming appointments in august.

## 2017-02-18 ENCOUNTER — Other Ambulatory Visit: Payer: Medicare HMO

## 2017-02-18 ENCOUNTER — Ambulatory Visit: Payer: Medicare HMO | Admitting: Oncology

## 2017-02-21 ENCOUNTER — Telehealth: Payer: Self-pay | Admitting: Oncology

## 2017-02-21 ENCOUNTER — Telehealth: Payer: Self-pay | Admitting: *Deleted

## 2017-02-21 NOTE — Telephone Encounter (Signed)
TC from patient to confirm appt time in September. This was done

## 2017-02-21 NOTE — Telephone Encounter (Signed)
Received call back from patient. He states he has dialysis Tuesday, Thursday Saturday. He needs an appt on a Monday, Wednesday or Friday. Message sent to scheduling

## 2017-02-21 NOTE — Telephone Encounter (Signed)
Left voicemail with patient regarding a change in their appt date and time.

## 2017-02-24 ENCOUNTER — Telehealth: Payer: Self-pay | Admitting: Oncology

## 2017-02-24 NOTE — Telephone Encounter (Signed)
Spoke with patient regarding his upcoming appointment.

## 2017-03-11 ENCOUNTER — Ambulatory Visit: Payer: Medicare HMO | Admitting: Oncology

## 2017-03-11 ENCOUNTER — Other Ambulatory Visit: Payer: Medicare HMO

## 2017-03-18 ENCOUNTER — Ambulatory Visit: Payer: Medicare HMO | Admitting: Oncology

## 2017-03-18 ENCOUNTER — Other Ambulatory Visit: Payer: Medicare HMO

## 2017-03-24 ENCOUNTER — Other Ambulatory Visit: Payer: Medicare HMO

## 2017-03-24 ENCOUNTER — Ambulatory Visit: Payer: Medicare HMO | Admitting: Oncology

## 2017-03-24 ENCOUNTER — Other Ambulatory Visit: Payer: Self-pay

## 2017-03-24 DIAGNOSIS — C183 Malignant neoplasm of hepatic flexure: Secondary | ICD-10-CM

## 2017-04-19 ENCOUNTER — Emergency Department (HOSPITAL_COMMUNITY): Payer: Medicare HMO

## 2017-04-19 ENCOUNTER — Non-Acute Institutional Stay (HOSPITAL_COMMUNITY)
Admission: EM | Admit: 2017-04-19 | Discharge: 2017-04-19 | Disposition: A | Discharge status: Home / Self Care | Payer: Medicare HMO | Attending: Emergency Medicine | Admitting: Emergency Medicine

## 2017-04-19 ENCOUNTER — Encounter (HOSPITAL_COMMUNITY): Payer: Self-pay | Admitting: Emergency Medicine

## 2017-04-19 DIAGNOSIS — Z800 Family history of malignant neoplasm of digestive organs: Secondary | ICD-10-CM | POA: Diagnosis not present

## 2017-04-19 DIAGNOSIS — D631 Anemia in chronic kidney disease: Secondary | ICD-10-CM | POA: Insufficient documentation

## 2017-04-19 DIAGNOSIS — Z881 Allergy status to other antibiotic agents status: Secondary | ICD-10-CM | POA: Insufficient documentation

## 2017-04-19 DIAGNOSIS — Z992 Dependence on renal dialysis: Secondary | ICD-10-CM | POA: Diagnosis not present

## 2017-04-19 DIAGNOSIS — E1122 Type 2 diabetes mellitus with diabetic chronic kidney disease: Secondary | ICD-10-CM | POA: Diagnosis not present

## 2017-04-19 DIAGNOSIS — N186 End stage renal disease: Secondary | ICD-10-CM | POA: Insufficient documentation

## 2017-04-19 DIAGNOSIS — Z803 Family history of malignant neoplasm of breast: Secondary | ICD-10-CM | POA: Diagnosis not present

## 2017-04-19 DIAGNOSIS — Z8042 Family history of malignant neoplasm of prostate: Secondary | ICD-10-CM | POA: Insufficient documentation

## 2017-04-19 DIAGNOSIS — Z79899 Other long term (current) drug therapy: Secondary | ICD-10-CM | POA: Diagnosis not present

## 2017-04-19 DIAGNOSIS — Z87891 Personal history of nicotine dependence: Secondary | ICD-10-CM | POA: Diagnosis not present

## 2017-04-19 DIAGNOSIS — Z9889 Other specified postprocedural states: Secondary | ICD-10-CM | POA: Diagnosis not present

## 2017-04-19 DIAGNOSIS — Z9049 Acquired absence of other specified parts of digestive tract: Secondary | ICD-10-CM | POA: Diagnosis not present

## 2017-04-19 DIAGNOSIS — I120 Hypertensive chronic kidney disease with stage 5 chronic kidney disease or end stage renal disease: Secondary | ICD-10-CM | POA: Diagnosis present

## 2017-04-19 DIAGNOSIS — Z85038 Personal history of other malignant neoplasm of large intestine: Secondary | ICD-10-CM | POA: Insufficient documentation

## 2017-04-19 LAB — CBC
HCT: 29.2 % — ABNORMAL LOW (ref 39.0–52.0)
HEMOGLOBIN: 9.6 g/dL — AB (ref 13.0–17.0)
MCH: 25.9 pg — AB (ref 26.0–34.0)
MCHC: 32.9 g/dL (ref 30.0–36.0)
MCV: 78.7 fL (ref 78.0–100.0)
PLATELETS: 184 10*3/uL (ref 150–400)
RBC: 3.71 MIL/uL — ABNORMAL LOW (ref 4.22–5.81)
RDW: 16.2 % — AB (ref 11.5–15.5)
WBC: 7.5 10*3/uL (ref 4.0–10.5)

## 2017-04-19 LAB — BASIC METABOLIC PANEL
Anion gap: 10 (ref 5–15)
BUN: 62 mg/dL — ABNORMAL HIGH (ref 6–20)
CALCIUM: 9.6 mg/dL (ref 8.9–10.3)
CO2: 29 mmol/L (ref 22–32)
CREATININE: 9.71 mg/dL — AB (ref 0.61–1.24)
Chloride: 92 mmol/L — ABNORMAL LOW (ref 101–111)
GFR, EST AFRICAN AMERICAN: 5 mL/min — AB (ref >60)
GFR, EST NON AFRICAN AMERICAN: 5 mL/min — AB (ref >60)
Glucose, Bld: 107 mg/dL — ABNORMAL HIGH (ref 65–99)
Potassium: 4.6 mmol/L (ref 3.5–5.1)
SODIUM: 131 mmol/L — AB (ref 135–145)

## 2017-04-19 LAB — MAGNESIUM: MAGNESIUM: 2.2 mg/dL (ref 1.7–2.4)

## 2017-04-19 MED ORDER — OXYCODONE HCL 5 MG PO TABS
ORAL_TABLET | ORAL | Status: AC
  Administered 2017-04-19: 10 mg via ORAL
  Filled 2017-04-19: qty 2

## 2017-04-19 MED ORDER — LIDOCAINE HCL (PF) 1 % IJ SOLN: 5.0000 mL | INTRAMUSCULAR | Status: DC | PRN

## 2017-04-19 MED ORDER — LIDOCAINE-PRILOCAINE 2.5-2.5 % EX CREA: 1.0000 "application " | TOPICAL_CREAM | CUTANEOUS | Status: DC | PRN

## 2017-04-19 MED ORDER — SODIUM CHLORIDE 0.9 % IV SOLN: 100.0000 mL | INTRAVENOUS | Status: DC | PRN

## 2017-04-19 MED ORDER — ALTEPLASE 2 MG IJ SOLR: 2.0000 mg | Freq: Once | INTRAMUSCULAR | Status: DC | PRN

## 2017-04-19 MED ORDER — HEPARIN SODIUM (PORCINE) 1000 UNIT/ML DIALYSIS: 1000.0000 [IU] | INTRAMUSCULAR | Status: DC | PRN

## 2017-04-19 MED ORDER — OXYCODONE HCL 5 MG PO TABS
10.0000 mg | ORAL_TABLET | Freq: Once | ORAL | Status: AC
  Administered 2017-04-19: 10 mg via ORAL

## 2017-04-19 MED ORDER — PENTAFLUOROPROP-TETRAFLUOROETH EX AERO: 1.0000 "application " | INHALATION_SPRAY | CUTANEOUS | Status: DC | PRN

## 2017-04-19 NOTE — Procedures (Signed)
   I was present at this dialysis session, have reviewed the session itself and made  appropriate changes Kelly Splinter MD [place] pager [phone]   04/19/2017, 2:45 PM
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
## 2017-04-19 NOTE — Progress Notes (Signed)
Patient arrived to unit per [facility] from ED.  Reviewed treatment plan and this RN agrees.  Report received from bedside RN, Vikki Ports.  Consent obtained.  Patient A & O X 4. Lung sounds clear to ausculation in all fields. No edema. Cardiac: NSR.  Prepped RUAVF with alcohol and cannulated with two 16 gauge needles.  Pulsation of blood noted.  Flushed access well with saline per protocol.  Connected and secured lines and initiated tx at 1415.  UF goal of 2500 mL and net fluid removal of 2000 mL.  Will continue to monitor.

## 2017-04-19 NOTE — Progress Notes (Signed)
Dialysis treatment completed.  2500 mL ultrafiltrated and net fluid removal 2000 mL.    Patient status unchanged. Lung sounds clear to ausculation in all fields. No edema. Cardiac: NSR.  Disconnected lines and removed needles.  Pressure held for 10 minutes and band aid/gauze dressing applied.  Patient discharged home per self post treatment.

## 2017-04-19 NOTE — ED Notes (Signed)
Pt given a [place] sandwich and water per [place], [place]

## 2017-04-19 NOTE — ED Notes (Signed)
Patient transported to X-ray 

## 2017-04-19 NOTE — ED Notes (Signed)
Pt ambulatory to restroom

## 2017-04-19 NOTE — ED Triage Notes (Signed)
Pt to ER for dialysis, states last treatment was Wednesday in [place], states he used to reside here in [place] but moved to [place] due to his wife passing away and has returned to [place] only to move out this week. States was told to come here if he wasn't back in time to get normal scheduled tx. Pt denies sob, swelling, etc. NAD. Denies pain. A/o x4.

## 2017-04-22 ENCOUNTER — Non-Acute Institutional Stay (HOSPITAL_COMMUNITY)
Admission: EM | Admit: 2017-04-22 | Discharge: 2017-04-22 | Disposition: A | Discharge status: Home / Self Care | Payer: Medicare HMO | Attending: Emergency Medicine | Admitting: Emergency Medicine

## 2017-04-22 ENCOUNTER — Encounter (HOSPITAL_COMMUNITY): Payer: Self-pay | Admitting: Emergency Medicine

## 2017-04-22 DIAGNOSIS — Z85038 Personal history of other malignant neoplasm of large intestine: Secondary | ICD-10-CM | POA: Insufficient documentation

## 2017-04-22 DIAGNOSIS — I120 Hypertensive chronic kidney disease with stage 5 chronic kidney disease or end stage renal disease: Secondary | ICD-10-CM | POA: Diagnosis not present

## 2017-04-22 DIAGNOSIS — Z9049 Acquired absence of other specified parts of digestive tract: Secondary | ICD-10-CM | POA: Insufficient documentation

## 2017-04-22 DIAGNOSIS — Z79899 Other long term (current) drug therapy: Secondary | ICD-10-CM | POA: Insufficient documentation

## 2017-04-22 DIAGNOSIS — Z87891 Personal history of nicotine dependence: Secondary | ICD-10-CM | POA: Insufficient documentation

## 2017-04-22 DIAGNOSIS — Z8042 Family history of malignant neoplasm of prostate: Secondary | ICD-10-CM | POA: Insufficient documentation

## 2017-04-22 DIAGNOSIS — N186 End stage renal disease: Secondary | ICD-10-CM

## 2017-04-22 DIAGNOSIS — Z992 Dependence on renal dialysis: Secondary | ICD-10-CM | POA: Insufficient documentation

## 2017-04-22 DIAGNOSIS — Z803 Family history of malignant neoplasm of breast: Secondary | ICD-10-CM | POA: Insufficient documentation

## 2017-04-22 DIAGNOSIS — E1122 Type 2 diabetes mellitus with diabetic chronic kidney disease: Secondary | ICD-10-CM | POA: Insufficient documentation

## 2017-04-22 LAB — COMPREHENSIVE METABOLIC PANEL
ALK PHOS: 111 U/L (ref 38–126)
ALT: 13 U/L — AB (ref 17–63)
AST: 22 U/L (ref 15–41)
Albumin: 3.2 g/dL — ABNORMAL LOW (ref 3.5–5.0)
Anion gap: 15 (ref 5–15)
BILIRUBIN TOTAL: 0.5 mg/dL (ref 0.3–1.2)
BUN: 55 mg/dL — AB (ref 6–20)
CHLORIDE: 93 mmol/L — AB (ref 101–111)
CO2: 24 mmol/L (ref 22–32)
CREATININE: 10.41 mg/dL — AB (ref 0.61–1.24)
Calcium: 8.3 mg/dL — ABNORMAL LOW (ref 8.9–10.3)
GFR, EST AFRICAN AMERICAN: 5 mL/min — AB (ref >60)
GFR, EST NON AFRICAN AMERICAN: 4 mL/min — AB (ref >60)
Glucose, Bld: 183 mg/dL — ABNORMAL HIGH (ref 65–99)
Potassium: 4.1 mmol/L (ref 3.5–5.1)
Sodium: 132 mmol/L — ABNORMAL LOW (ref 135–145)
TOTAL PROTEIN: 6.6 g/dL (ref 6.5–8.1)

## 2017-04-22 LAB — CBC WITH DIFFERENTIAL/PLATELET
Basophils Absolute: 0 10*3/uL (ref 0.0–0.1)
Basophils Relative: 1 %
EOS ABS: 0.3 10*3/uL (ref 0.0–0.7)
EOS PCT: 5 %
HCT: 29.1 % — ABNORMAL LOW (ref 39.0–52.0)
Hemoglobin: 9.7 g/dL — ABNORMAL LOW (ref 13.0–17.0)
LYMPHS ABS: 1.1 10*3/uL (ref 0.7–4.0)
Lymphocytes Relative: 18 %
MCH: 25.9 pg — AB (ref 26.0–34.0)
MCHC: 33.3 g/dL (ref 30.0–36.0)
MCV: 77.6 fL — ABNORMAL LOW (ref 78.0–100.0)
Monocytes Absolute: 0.6 10*3/uL (ref 0.1–1.0)
Monocytes Relative: 10 %
Neutro Abs: 4 10*3/uL (ref 1.7–7.7)
Neutrophils Relative %: 66 %
PLATELETS: 198 10*3/uL (ref 150–400)
RBC: 3.75 MIL/uL — AB (ref 4.22–5.81)
RDW: 16.2 % — AB (ref 11.5–15.5)
WBC: 5.9 10*3/uL (ref 4.0–10.5)

## 2017-04-22 MED ORDER — LIDOCAINE-PRILOCAINE 2.5-2.5 % EX CREA: 1.0000 "application " | TOPICAL_CREAM | CUTANEOUS | Status: DC | PRN

## 2017-04-22 MED ORDER — ALTEPLASE 2 MG IJ SOLR: 2.0000 mg | Freq: Once | INTRAMUSCULAR | Status: DC | PRN

## 2017-04-22 MED ORDER — SODIUM CHLORIDE 0.9 % IV SOLN: 100.0000 mL | INTRAVENOUS | Status: DC | PRN

## 2017-04-22 MED ORDER — LIDOCAINE HCL (PF) 1 % IJ SOLN: 5.0000 mL | INTRAMUSCULAR | Status: DC | PRN

## 2017-04-22 MED ORDER — HEPARIN SODIUM (PORCINE) 1000 UNIT/ML DIALYSIS: 1000.0000 [IU] | INTRAMUSCULAR | Status: DC | PRN

## 2017-04-22 MED ORDER — PENTAFLUOROPROP-TETRAFLUOROETH EX AERO: 1.0000 "application " | INHALATION_SPRAY | CUTANEOUS | Status: DC | PRN

## 2017-04-22 MED ORDER — HEPARIN SODIUM (PORCINE) 1000 UNIT/ML DIALYSIS
20.0000 [IU]/kg | INTRAMUSCULAR | Status: DC | PRN
  Administered 2017-04-22: 1300 [IU] via INTRAVENOUS_CENTRAL

## 2017-04-22 NOTE — Progress Notes (Signed)
HD tx ended 13 min early @ 2136 at pt's request d/t cramping/wanting to come off tx, UF goal still met, blood rinsed back, VSS, pt was d/c from unit walking to ED to get ride home. Pt only let me hold sites 5 min each as he insisted they were done and he wouldn't let me hold any longer.

## 2017-04-22 NOTE — ED Triage Notes (Signed)
States is moving back to [place] and has to have dialysis,  Has no dr here so he presents for dialysis , was seen on the 6 for same

## 2017-04-22 NOTE — ED Notes (Signed)
Report given to Diam, RN by Benjamine Mola, RN

## 2017-04-22 NOTE — ED Notes (Signed)
Called patient for vitals, no answer.
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
## 2017-04-23 LAB — HEPATITIS B SURFACE ANTIGEN: HEP B S AG: NEGATIVE

## 2017-04-24 LAB — HEPATITIS B SURFACE ANTIBODY,QUALITATIVE: Hep B S Ab: REACTIVE

## 2017-04-25 ENCOUNTER — Emergency Department (HOSPITAL_COMMUNITY)
Admission: EM | Admit: 2017-04-25 | Discharge: 2017-04-28 | Disposition: A | Discharge status: Home / Self Care | Payer: Medicare HMO | Attending: Emergency Medicine | Admitting: Emergency Medicine

## 2017-04-25 ENCOUNTER — Encounter (HOSPITAL_COMMUNITY): Payer: Self-pay | Admitting: Emergency Medicine

## 2017-04-25 DIAGNOSIS — E1122 Type 2 diabetes mellitus with diabetic chronic kidney disease: Secondary | ICD-10-CM | POA: Diagnosis not present

## 2017-04-25 DIAGNOSIS — I120 Hypertensive chronic kidney disease with stage 5 chronic kidney disease or end stage renal disease: Secondary | ICD-10-CM | POA: Diagnosis not present

## 2017-04-25 DIAGNOSIS — Z87891 Personal history of nicotine dependence: Secondary | ICD-10-CM | POA: Insufficient documentation

## 2017-04-25 DIAGNOSIS — Z992 Dependence on renal dialysis: Secondary | ICD-10-CM | POA: Insufficient documentation

## 2017-04-25 DIAGNOSIS — N186 End stage renal disease: Secondary | ICD-10-CM | POA: Diagnosis present

## 2017-04-25 DIAGNOSIS — Z85038 Personal history of other malignant neoplasm of large intestine: Secondary | ICD-10-CM | POA: Insufficient documentation

## 2017-04-25 LAB — CBC
HEMATOCRIT: 28.6 % — AB (ref 39.0–52.0)
HEMOGLOBIN: 9.5 g/dL — AB (ref 13.0–17.0)
MCH: 25.5 pg — ABNORMAL LOW (ref 26.0–34.0)
MCHC: 33.2 g/dL (ref 30.0–36.0)
MCV: 76.7 fL — ABNORMAL LOW (ref 78.0–100.0)
Platelets: 188 10*3/uL (ref 150–400)
RBC: 3.73 MIL/uL — AB (ref 4.22–5.81)
RDW: 16.8 % — AB (ref 11.5–15.5)
WBC: 6.2 10*3/uL (ref 4.0–10.5)

## 2017-04-25 LAB — COMPREHENSIVE METABOLIC PANEL
ALBUMIN: 3.4 g/dL — AB (ref 3.5–5.0)
ALT: 12 U/L — AB (ref 17–63)
AST: 22 U/L (ref 15–41)
Alkaline Phosphatase: 96 U/L (ref 38–126)
Anion gap: 12 (ref 5–15)
BILIRUBIN TOTAL: 0.8 mg/dL (ref 0.3–1.2)
BUN: 54 mg/dL — AB (ref 6–20)
CALCIUM: 7.9 mg/dL — AB (ref 8.9–10.3)
CO2: 25 mmol/L (ref 22–32)
CREATININE: 10.01 mg/dL — AB (ref 0.61–1.24)
Chloride: 93 mmol/L — ABNORMAL LOW (ref 101–111)
GFR calc Af Amer: 5 mL/min — ABNORMAL LOW (ref >60)
GFR calc non Af Amer: 5 mL/min — ABNORMAL LOW (ref >60)
GLUCOSE: 140 mg/dL — AB (ref 65–99)
POTASSIUM: 4.3 mmol/L (ref 3.5–5.1)
Sodium: 130 mmol/L — ABNORMAL LOW (ref 135–145)
Total Protein: 6.7 g/dL (ref 6.5–8.1)

## 2017-04-25 MED ORDER — HEPARIN SODIUM (PORCINE) 1000 UNIT/ML DIALYSIS
1000.0000 [IU] | INTRAMUSCULAR | Status: DC | PRN
  Filled 2017-04-25: qty 1

## 2017-04-25 MED ORDER — ALTEPLASE 2 MG IJ SOLR: 2.0000 mg | Freq: Once | INTRAMUSCULAR | Status: DC | PRN

## 2017-04-25 MED ORDER — LIDOCAINE-PRILOCAINE 2.5-2.5 % EX CREA
1.0000 "application " | TOPICAL_CREAM | CUTANEOUS | Status: DC | PRN
  Filled 2017-04-25: qty 5

## 2017-04-25 MED ORDER — HEPARIN SODIUM (PORCINE) 1000 UNIT/ML DIALYSIS
20.0000 [IU]/kg | INTRAMUSCULAR | Status: DC | PRN
  Filled 2017-04-25: qty 2

## 2017-04-25 MED ORDER — PENTAFLUOROPROP-TETRAFLUOROETH EX AERO
1.0000 "application " | INHALATION_SPRAY | CUTANEOUS | Status: DC | PRN
  Filled 2017-04-25: qty 30

## 2017-04-25 MED ORDER — SODIUM CHLORIDE 0.9 % IV SOLN: 100.0000 mL | INTRAVENOUS | Status: DC | PRN

## 2017-04-25 MED ORDER — LIDOCAINE HCL (PF) 1 % IJ SOLN: 5.0000 mL | INTRAMUSCULAR | Status: DC | PRN

## 2017-04-25 NOTE — ED Notes (Signed)
No answer x1

## 2017-04-25 NOTE — ED Triage Notes (Signed)
Pt here from out of town because his wife died. Had last dialysis treatment Tues. Missed Thursday appt and couldn't get an appt for today. Here requesting dialysis. C/o some weakness and bilateral foot swelling. Pt awake, alert, ambulatory, NAD at present.
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
## 2017-04-25 NOTE — Care Management (Signed)
ED CM received consult from Dr Venora Maples concerning patient needing a transient outpatient HD center while here visit from [place]. CM met with patient he reports getting a transient HD seat at [facility] HD but arrived late and was told his seat was given to someone visiting as well. Patient presented documentation from his home HD center, and he tells me he expects to be here until 05/09/17. CM contacted the [facility] Team [phone] regarding patient need for transient HD, they are able to assist patient with find a local center while he is here. They will reach out to patient at the verified number. Updated Dr. Venora Maples and provided patient with their contact information. Patient verbalized understanding teach back done. No further question or concerns verbalized.  No further ED CM needs identfied.

## 2017-04-25 NOTE — ED Notes (Signed)
This Rn called dialysis and asked about when the pt might be dialyzed. Pt will not be able to get dialysis until around 2300. Dialysis will call.

## 2017-04-25 NOTE — ED Notes (Signed)
Report called to [facility] RN all belongings taken with pt to floor.

## 2017-04-26 MED ORDER — ACETAMINOPHEN 325 MG PO TABS
ORAL_TABLET | ORAL | Status: AC
  Administered 2017-04-26: 650 mg
  Filled 2017-04-26: qty 2

## 2017-04-26 NOTE — Progress Notes (Signed)
Pt dc'd to home from HD unit post dialysis tx. Tolerated tx well, goal met.  Pt understands that he is supposed to hear back from the SW Monday regarding a center in [place] for him to dialyze until he gets moved to [place].

## 2017-04-29 ENCOUNTER — Encounter (HOSPITAL_COMMUNITY): Payer: Self-pay

## 2017-04-29 ENCOUNTER — Non-Acute Institutional Stay (HOSPITAL_COMMUNITY)
Admission: EM | Admit: 2017-04-29 | Discharge: 2017-04-29 | Disposition: A | Discharge status: Home / Self Care | Payer: Medicare HMO | Attending: Emergency Medicine | Admitting: Emergency Medicine

## 2017-04-29 DIAGNOSIS — Z85038 Personal history of other malignant neoplasm of large intestine: Secondary | ICD-10-CM | POA: Diagnosis not present

## 2017-04-29 DIAGNOSIS — Z79899 Other long term (current) drug therapy: Secondary | ICD-10-CM | POA: Insufficient documentation

## 2017-04-29 DIAGNOSIS — Z992 Dependence on renal dialysis: Secondary | ICD-10-CM | POA: Diagnosis present

## 2017-04-29 DIAGNOSIS — D631 Anemia in chronic kidney disease: Secondary | ICD-10-CM | POA: Insufficient documentation

## 2017-04-29 DIAGNOSIS — N186 End stage renal disease: Secondary | ICD-10-CM | POA: Insufficient documentation

## 2017-04-29 DIAGNOSIS — I120 Hypertensive chronic kidney disease with stage 5 chronic kidney disease or end stage renal disease: Secondary | ICD-10-CM | POA: Diagnosis not present

## 2017-04-29 DIAGNOSIS — E1122 Type 2 diabetes mellitus with diabetic chronic kidney disease: Secondary | ICD-10-CM | POA: Diagnosis not present

## 2017-04-29 DIAGNOSIS — Z87891 Personal history of nicotine dependence: Secondary | ICD-10-CM | POA: Diagnosis not present

## 2017-04-29 LAB — CBC
HCT: 28.4 % — ABNORMAL LOW (ref 39.0–52.0)
Hemoglobin: 9.6 g/dL — ABNORMAL LOW (ref 13.0–17.0)
MCH: 25.9 pg — AB (ref 26.0–34.0)
MCHC: 33.8 g/dL (ref 30.0–36.0)
MCV: 76.5 fL — ABNORMAL LOW (ref 78.0–100.0)
PLATELETS: 206 10*3/uL (ref 150–400)
RBC: 3.71 MIL/uL — ABNORMAL LOW (ref 4.22–5.81)
RDW: 16.8 % — AB (ref 11.5–15.5)
WBC: 6.3 10*3/uL (ref 4.0–10.5)

## 2017-04-29 LAB — BASIC METABOLIC PANEL
Anion gap: 16 — ABNORMAL HIGH (ref 5–15)
BUN: 48 mg/dL — AB (ref 6–20)
CALCIUM: 8.1 mg/dL — AB (ref 8.9–10.3)
CHLORIDE: 92 mmol/L — AB (ref 101–111)
CO2: 21 mmol/L — ABNORMAL LOW (ref 22–32)
Creatinine, Ser: 10.4 mg/dL — ABNORMAL HIGH (ref 0.61–1.24)
GFR calc Af Amer: 5 mL/min — ABNORMAL LOW (ref >60)
GFR, EST NON AFRICAN AMERICAN: 4 mL/min — AB (ref >60)
Glucose, Bld: 116 mg/dL — ABNORMAL HIGH (ref 65–99)
Potassium: 4.6 mmol/L (ref 3.5–5.1)
SODIUM: 129 mmol/L — AB (ref 135–145)

## 2017-04-29 MED ORDER — SODIUM CHLORIDE 0.9 % IV SOLN: 100.0000 mL | INTRAVENOUS | Status: DC | PRN

## 2017-04-29 MED ORDER — ALTEPLASE 2 MG IJ SOLR: 2.0000 mg | Freq: Once | INTRAMUSCULAR | Status: DC | PRN

## 2017-04-29 MED ORDER — HEPARIN SODIUM (PORCINE) 1000 UNIT/ML DIALYSIS: 20.0000 [IU]/kg | INTRAMUSCULAR | Status: DC | PRN

## 2017-04-29 MED ORDER — DARBEPOETIN ALFA 60 MCG/0.3ML IJ SOSY: 60.0000 ug | PREFILLED_SYRINGE | INTRAMUSCULAR | Status: DC

## 2017-04-29 MED ORDER — PENTAFLUOROPROP-TETRAFLUOROETH EX AERO: 1.0000 "application " | INHALATION_SPRAY | CUTANEOUS | Status: DC | PRN

## 2017-04-29 MED ORDER — LIDOCAINE HCL (PF) 1 % IJ SOLN: 5.0000 mL | INTRAMUSCULAR | Status: DC | PRN

## 2017-04-29 MED ORDER — HEPARIN SODIUM (PORCINE) 1000 UNIT/ML DIALYSIS: 1000.0000 [IU] | INTRAMUSCULAR | Status: DC | PRN

## 2017-04-29 MED ORDER — LIDOCAINE-PRILOCAINE 2.5-2.5 % EX CREA: 1.0000 "application " | TOPICAL_CREAM | CUTANEOUS | Status: DC | PRN

## 2017-04-29 NOTE — Progress Notes (Signed)
Patient ID: Tim Armstrong, male   DOB: 09-03-1945, 71 y.o.   MRN: [id number]  [facility] Progress Note    Subjective:   Tim Armstrong is a 71 y.o. year-old AAM with hx of HTN, colon Ca and ESRD presented to ED here asking for dialysis.  Pt is from here originally but has been in [place] getting HD there for the last 4 mos after his wife died.  Says he went there to bury her.  Now pt is in town collecting his belongings to move permanently and he had initiated a process to get HD here at [facility] but it has been delayed until 05/01/17 and he has been receiving HD here intermittently for the past week and a half.  Denies any SOB, CP, but does have some ankle edema.    Objective:   BP (!) 152/78   Pulse 72   Temp 98 F (36.7 C) (Oral)   Resp 18   Ht 5\' 11"  (1.803 m)   Wt 68 kg (150 lb) Comment: dry weight  SpO2 100%   BMI 20.92 kg/m   Intake/Output: No intake/output data recorded.   Intake/Output this shift:  No intake/output data recorded. Weight change:   Physical Exam: Gen:WD WN AAM in NAD CVS: no rub Resp:cta DXA:JOINOM VEH:MCNOB pretibial edema, RUE AVF with 2 areas of aneurysmal changes +T/B  Labs: BMET  Recent Labs Lab 04/25/17 1406 04/29/17 1238  NA 130* 129*  K 4.3 4.6  CL 93* 92*  CO2 25 21*  GLUCOSE 140* 116*  BUN 54* 48*  CREATININE 10.01* 10.40*  ALBUMIN 3.4*  --   CALCIUM 7.9* 8.1*   CBC  Recent Labs Lab 04/25/17 1406 04/29/17 1238  WBC 6.2 6.3  HGB 9.5* 9.6*  HCT 28.6* 28.4*  MCV 76.7* 76.5*  PLT 188 206    @IMGRELPRIORS @ Medications:      Assessment/ Plan:   1. ESRD [name] has been here following the death and burial of his wife and is not due to go back to [place] until next week.  He has been set up for outpatient HD at [facility] but not until 05/01/17 so will have HD today and f/u there on Thursday 2. Anemia: will need ESA and will dose today with HD 3. CKD-MBD: continue with outpatient  meds 4. Nutrition: renal diet 5. Hypertension: stable 6. Disposition- will be discharged after HD today and f/u at [facility] on Thursday.  Tim Potts, MD [place] Pager [phone] 04/29/2017, 4:15 PM
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
## 2017-04-29 NOTE — Progress Notes (Signed)
Patient requested to end treatment with 2.09hrs remaining.  Patient had orders to discharge home post HD tx.  Patient educated on hemodialysis compliance and encouraged to stay to complete treatment.  Patient stated he had some emergent business to take care of that could not wait.  AMA signed by patient and Dr. Florene Glen notified.  Post VS- 159/82, HR-81, R- 18, T-98.4.  Pt denies complaint and is being discharged to home with self care.

## 2017-04-29 NOTE — ED Triage Notes (Signed)
Patient has recently moved back to [facility] and has yet to be established at dialysis center. [facility] and was told today to come back here for dialysis since cannot be worked into center. No complaints, alert and oriented

## 2017-05-06 ENCOUNTER — Encounter (HOSPITAL_COMMUNITY): Payer: Self-pay | Admitting: Emergency Medicine

## 2017-05-06 ENCOUNTER — Emergency Department (HOSPITAL_COMMUNITY): Payer: Medicare HMO

## 2017-05-06 ENCOUNTER — Emergency Department (HOSPITAL_COMMUNITY)
Admission: EM | Admit: 2017-05-06 | Discharge: 2017-05-06 | Disposition: A | Discharge status: Home / Self Care | Payer: Medicare HMO | Attending: Physician Assistant | Admitting: Physician Assistant

## 2017-05-06 DIAGNOSIS — S93491A Sprain of other ligament of right ankle, initial encounter: Secondary | ICD-10-CM | POA: Diagnosis not present

## 2017-05-06 DIAGNOSIS — Z87891 Personal history of nicotine dependence: Secondary | ICD-10-CM | POA: Insufficient documentation

## 2017-05-06 DIAGNOSIS — W109XXA Fall (on) (from) unspecified stairs and steps, initial encounter: Secondary | ICD-10-CM | POA: Diagnosis not present

## 2017-05-06 DIAGNOSIS — Y999 Unspecified external cause status: Secondary | ICD-10-CM | POA: Diagnosis not present

## 2017-05-06 DIAGNOSIS — S99911A Unspecified injury of right ankle, initial encounter: Secondary | ICD-10-CM | POA: Diagnosis present

## 2017-05-06 DIAGNOSIS — N186 End stage renal disease: Secondary | ICD-10-CM | POA: Insufficient documentation

## 2017-05-06 DIAGNOSIS — I120 Hypertensive chronic kidney disease with stage 5 chronic kidney disease or end stage renal disease: Secondary | ICD-10-CM | POA: Insufficient documentation

## 2017-05-06 DIAGNOSIS — Y939 Activity, unspecified: Secondary | ICD-10-CM | POA: Insufficient documentation

## 2017-05-06 DIAGNOSIS — Y929 Unspecified place or not applicable: Secondary | ICD-10-CM | POA: Diagnosis not present

## 2017-05-06 DIAGNOSIS — E1122 Type 2 diabetes mellitus with diabetic chronic kidney disease: Secondary | ICD-10-CM | POA: Insufficient documentation

## 2017-05-06 DIAGNOSIS — Z992 Dependence on renal dialysis: Secondary | ICD-10-CM | POA: Insufficient documentation

## 2017-05-06 NOTE — Discharge Instructions (Signed)
Ice and elevate your ankle. Return here as needed.

## 2017-05-06 NOTE — ED Notes (Signed)
Pt ambulated to room from waiting room, tolerated well. 
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
## 2017-05-06 NOTE — ED Notes (Signed)
Esignature not working. Pt given d/c instructions and agreeable to discharge

## 2017-05-11 ENCOUNTER — Encounter (HOSPITAL_COMMUNITY): Payer: Self-pay

## 2017-05-11 ENCOUNTER — Emergency Department (HOSPITAL_COMMUNITY)
Admission: EM | Admit: 2017-05-11 | Discharge: 2017-05-11 | Disposition: A | Discharge status: Home / Self Care | Payer: Medicare HMO | Attending: Emergency Medicine | Admitting: Emergency Medicine

## 2017-05-11 DIAGNOSIS — I120 Hypertensive chronic kidney disease with stage 5 chronic kidney disease or end stage renal disease: Secondary | ICD-10-CM | POA: Diagnosis not present

## 2017-05-11 DIAGNOSIS — Z85038 Personal history of other malignant neoplasm of large intestine: Secondary | ICD-10-CM | POA: Insufficient documentation

## 2017-05-11 DIAGNOSIS — E1122 Type 2 diabetes mellitus with diabetic chronic kidney disease: Secondary | ICD-10-CM | POA: Insufficient documentation

## 2017-05-11 DIAGNOSIS — Z87891 Personal history of nicotine dependence: Secondary | ICD-10-CM | POA: Insufficient documentation

## 2017-05-11 DIAGNOSIS — Z992 Dependence on renal dialysis: Secondary | ICD-10-CM | POA: Diagnosis present

## 2017-05-11 DIAGNOSIS — N186 End stage renal disease: Secondary | ICD-10-CM | POA: Insufficient documentation

## 2017-05-11 LAB — BASIC METABOLIC PANEL
Anion gap: 14 (ref 5–15)
BUN: 47 mg/dL — ABNORMAL HIGH (ref 6–20)
CALCIUM: 8.9 mg/dL (ref 8.9–10.3)
CO2: 27 mmol/L (ref 22–32)
CREATININE: 10.66 mg/dL — AB (ref 0.61–1.24)
Chloride: 95 mmol/L — ABNORMAL LOW (ref 101–111)
GFR, EST AFRICAN AMERICAN: 5 mL/min — AB (ref >60)
GFR, EST NON AFRICAN AMERICAN: 4 mL/min — AB (ref >60)
Glucose, Bld: 108 mg/dL — ABNORMAL HIGH (ref 65–99)
Potassium: 3.8 mmol/L (ref 3.5–5.1)
SODIUM: 136 mmol/L (ref 135–145)

## 2017-05-11 LAB — CBC
HCT: 30.5 % — ABNORMAL LOW (ref 39.0–52.0)
Hemoglobin: 10.2 g/dL — ABNORMAL LOW (ref 13.0–17.0)
MCH: 26.6 pg (ref 26.0–34.0)
MCHC: 33.4 g/dL (ref 30.0–36.0)
MCV: 79.6 fL (ref 78.0–100.0)
Platelets: 236 10*3/uL (ref 150–400)
RBC: 3.83 MIL/uL — ABNORMAL LOW (ref 4.22–5.81)
RDW: 17.2 % — AB (ref 11.5–15.5)
WBC: 5.7 10*3/uL (ref 4.0–10.5)

## 2017-05-11 MED ORDER — SODIUM POLYSTYRENE SULFONATE 15 GM/60ML PO SUSP
45.0000 g | Freq: Once | ORAL | Status: AC
  Administered 2017-05-11: 45 g via ORAL
  Filled 2017-05-11: qty 180

## 2017-05-11 NOTE — ED Triage Notes (Signed)
Patient reports that he is here and thinks he needs a dialysis treatment, states that his last treatment was Thursday and missed yesterday, no SOB, NAD
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
## 2017-05-11 NOTE — Discharge Instructions (Signed)
Follow-up with your dialysis in [place].  If you feel as if you need dialysis before you leave call your dialysis center.

## 2017-05-13 ENCOUNTER — Encounter (HOSPITAL_COMMUNITY): Payer: Self-pay

## 2017-05-13 ENCOUNTER — Emergency Department (HOSPITAL_COMMUNITY)
Admission: EM | Admit: 2017-05-13 | Discharge: 2017-05-13 | Disposition: A | Discharge status: Home / Self Care | Payer: Medicare HMO | Attending: Physician Assistant | Admitting: Physician Assistant

## 2017-05-13 DIAGNOSIS — N186 End stage renal disease: Secondary | ICD-10-CM | POA: Insufficient documentation

## 2017-05-13 DIAGNOSIS — I120 Hypertensive chronic kidney disease with stage 5 chronic kidney disease or end stage renal disease: Secondary | ICD-10-CM | POA: Diagnosis not present

## 2017-05-13 DIAGNOSIS — D010 Carcinoma in situ of colon: Secondary | ICD-10-CM | POA: Insufficient documentation

## 2017-05-13 DIAGNOSIS — E1122 Type 2 diabetes mellitus with diabetic chronic kidney disease: Secondary | ICD-10-CM | POA: Insufficient documentation

## 2017-05-13 DIAGNOSIS — Z87891 Personal history of nicotine dependence: Secondary | ICD-10-CM | POA: Insufficient documentation

## 2017-05-13 DIAGNOSIS — Z992 Dependence on renal dialysis: Secondary | ICD-10-CM | POA: Insufficient documentation

## 2017-05-13 LAB — BASIC METABOLIC PANEL
Anion gap: 17 — ABNORMAL HIGH (ref 5–15)
BUN: 64 mg/dL — ABNORMAL HIGH (ref 6–20)
CALCIUM: 8.8 mg/dL — AB (ref 8.9–10.3)
CHLORIDE: 95 mmol/L — AB (ref 101–111)
CO2: 26 mmol/L (ref 22–32)
CREATININE: 13.18 mg/dL — AB (ref 0.61–1.24)
GFR calc Af Amer: 4 mL/min — ABNORMAL LOW (ref >60)
GFR, EST NON AFRICAN AMERICAN: 3 mL/min — AB (ref >60)
Glucose, Bld: 124 mg/dL — ABNORMAL HIGH (ref 65–99)
POTASSIUM: 3 mmol/L — AB (ref 3.5–5.1)
Sodium: 138 mmol/L (ref 135–145)

## 2017-05-13 NOTE — ED Triage Notes (Signed)
Patient here to have potassium rechecked since he has missed dialysis. Was here over weekend and potassium within range, no complaints

## 2017-05-13 NOTE — ED Notes (Signed)
Pt reports he feels lightheaded from not eating. Pt went to cafeteria

## 2017-05-13 NOTE — Discharge Instructions (Signed)
We have observed no abnormalities that would qualify you for emergent dialysis.  Please follow-up with a local nephrologist or your nephrologist in [place].  Please return to the ED should you begin to have chest pain, palpitations, shortness of breath, leg swelling, vomiting, or any other major concerns.
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
## 2019-05-14 IMAGING — CR DG CHEST 2V
2 series · 2 of 2 positions shown · non-contrast
Comparison: Chest x-ray dated 03/27/2016.

CLINICAL DATA: Due for dialysis, eval pulmonary edema. Hx of
hypertension, diabetes, av fistula placement, dialysis catheter
placement. Former smoker([id number]).

EXAM:
CHEST  2 VIEW

[chest pa]
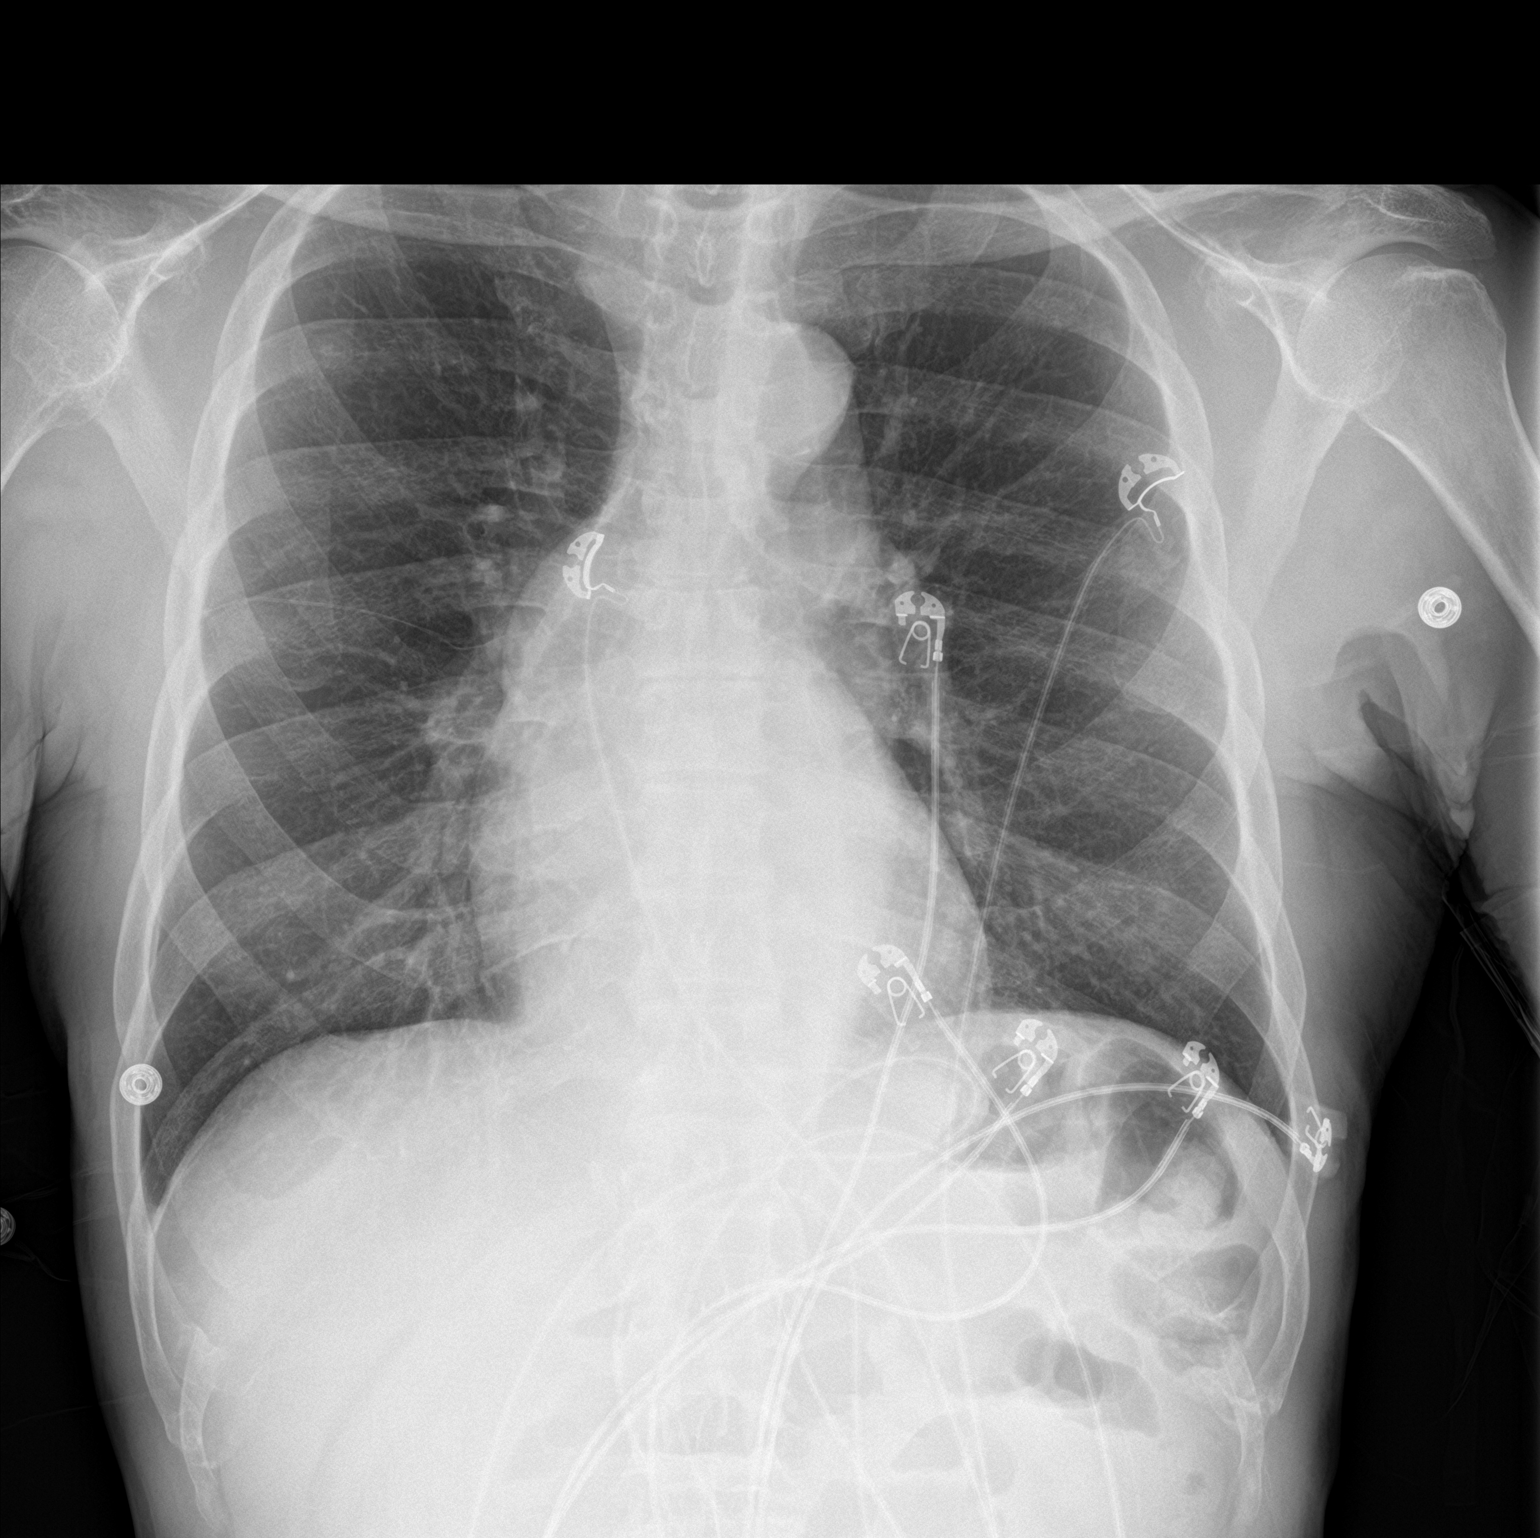

[chest lat]
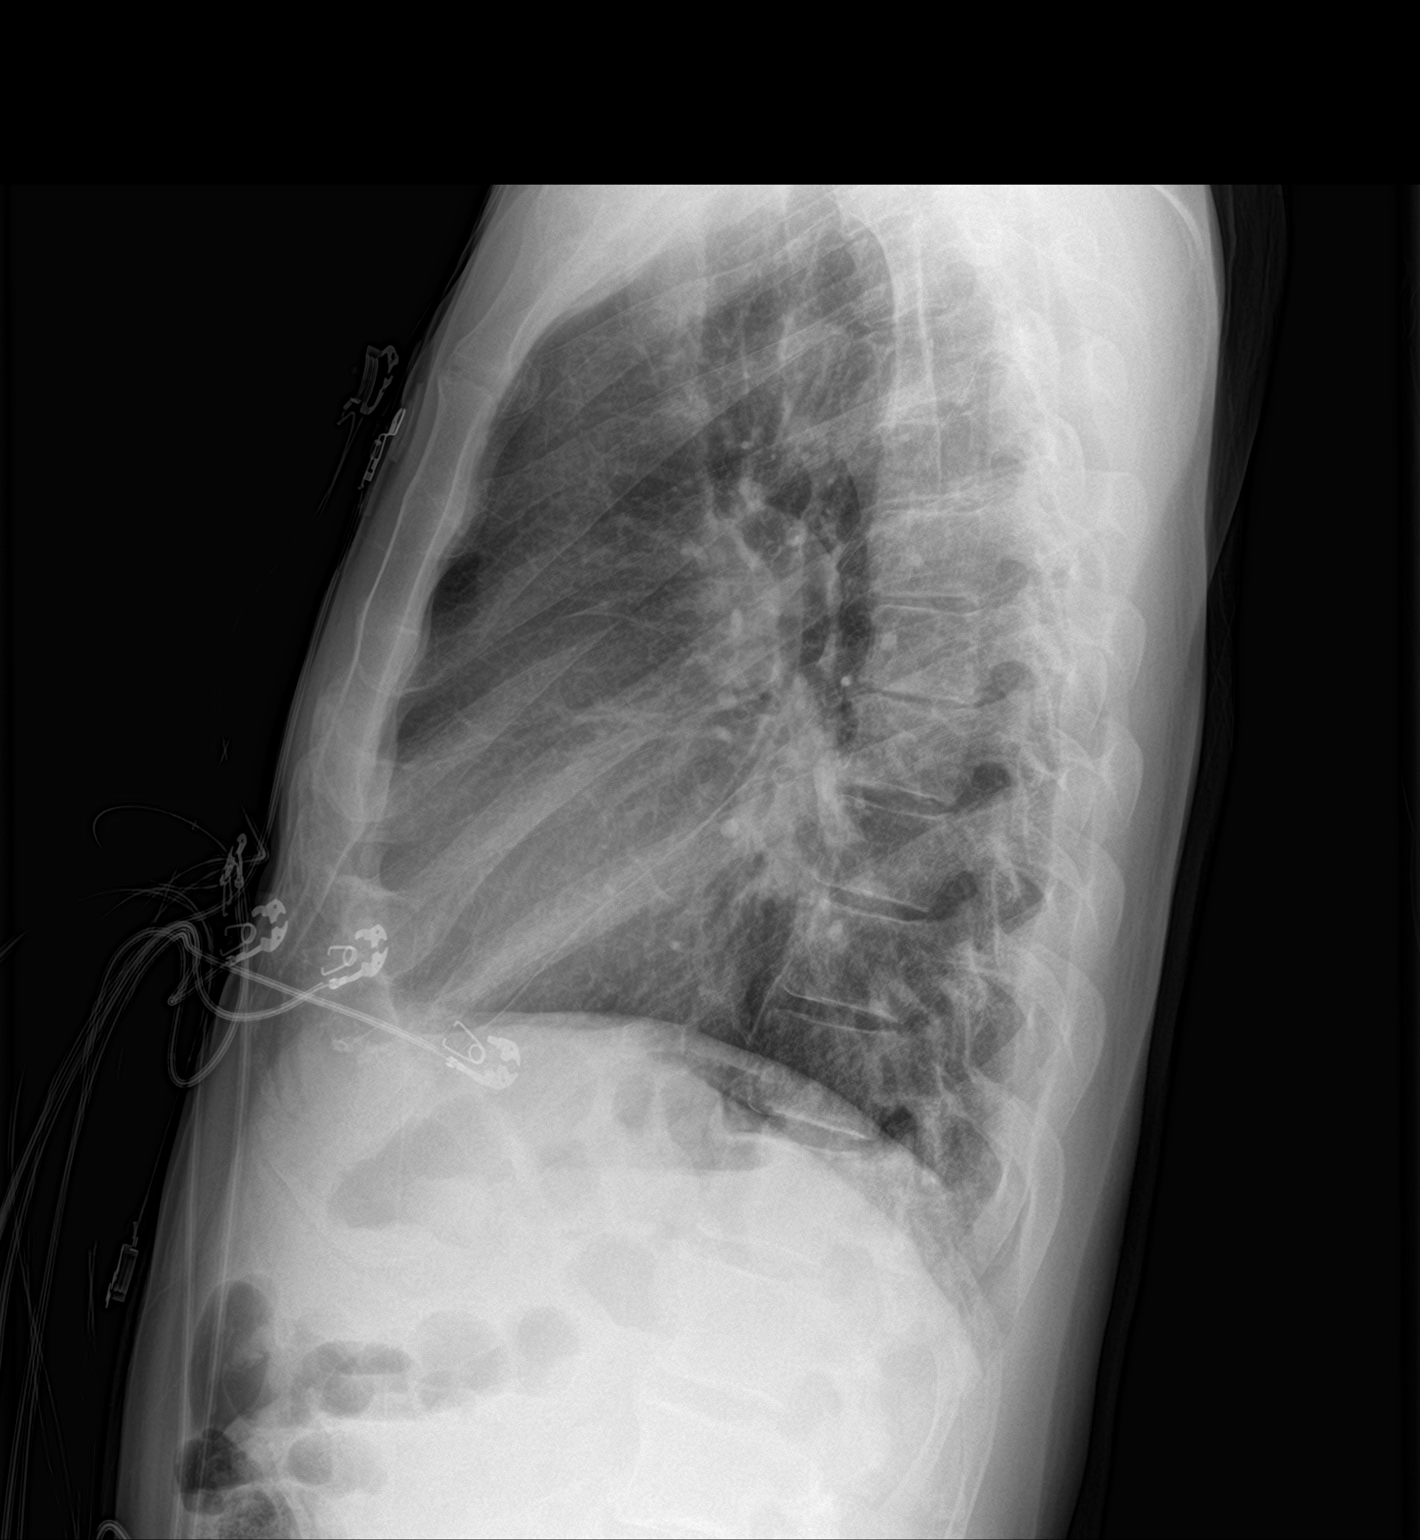

[2 of 2 positions shown; findings below may reference images not displayed]

FINDINGS: Heart size and mediastinal contours are within normal limits. Lungs
are clear. No pleural effusion or pneumothorax seen. No acute or
suspicious osseous finding.
IMPRESSION: No active cardiopulmonary disease. No evidence of pneumonia or
pulmonary edema.
